# Patient Record
Sex: Male | Born: 1975 | Hispanic: Yes | Marital: Married | Smoking: Former smoker
Health system: Southern US, Community
[De-identification: ages and names within clinical notes are randomized; demographics above are authoritative.]

## PROBLEM LIST (undated history)

## (undated) DIAGNOSIS — J1282 Pneumonia due to coronavirus disease 2019: Secondary | ICD-10-CM

## (undated) DIAGNOSIS — J9621 Acute and chronic respiratory failure with hypoxia: Secondary | ICD-10-CM

## (undated) DIAGNOSIS — Z6830 Body mass index (BMI) 30.0-30.9, adult: Secondary | ICD-10-CM

## (undated) DIAGNOSIS — U071 COVID-19: Secondary | ICD-10-CM

## (undated) DIAGNOSIS — J939 Pneumothorax, unspecified: Secondary | ICD-10-CM

## (undated) DIAGNOSIS — K219 Gastro-esophageal reflux disease without esophagitis: Secondary | ICD-10-CM

## (undated) HISTORY — PX: APPENDECTOMY: SHX54

## (undated) HISTORY — DX: Body mass index (BMI) 30.0-30.9, adult: Z68.30

## (undated) HISTORY — DX: Gastro-esophageal reflux disease without esophagitis: K21.9

## (undated) NOTE — *Deleted (*Deleted)
Physical Medicine and Rehabilitation Admission H&P    CC: Functional deficits due to debility.   HPI: Dennis Howe is a 26 year old male (unvaccinated) who was originally admitted to [facility] on 03/25/20 with reports of chest pain, hypoxia and DOE after testing positive for Covid 19 on 03/21/20.  He was treated with steroids, multiple antibiotics for PNA as well as  Actemra and Remdesivir but had worsening of respiratory status with development of pneumothoraces, pneumomediastinum and subcutaneous emphysema  requiring intubation on 10/09 due to ARDS. Multiple  chest tube placement 10/10, he underwent tracheostomy 10/26 and was slow to wean off vent. He was found to have superficial venous thrombosis around PICC which was removed as well as  SSTI last treated with cefdinir 11/03.  He had recurrent pneumothoraces requiring need for 2 right sided and one left sided chest tubes which were clamped with recommendations to remove when appropriate. Due to ongoing vent needs, he was discharged to [facility] on 04/29/20.  PEG placed on 11/10 by Dr. Fredia Sorrow. He tolerated extubation to ATC and was started on capping trails therefore was   Therapy has been ongoing and patient noted to be significantly deconditioned.      ROS    Past Medical History:  Diagnosis Date  . Acute on chronic respiratory failure with hypoxia (HCC)   . Bilateral pneumothoraces   . BMI 30.0-30.9,adult   . COVID-19 virus infection   . Pneumonia due to COVID-19 virus     Past Surgical History:  Procedure Laterality Date  . IR GASTROSTOMY TUBE MOD SED  05/03/2020    Family History  Problem Relation Age of Onset  . Diabetes Mother      Social History: Lives with wife. Independent and was working in [employer]. He used to smoke 1/2 PPD--quit in 2000. Alcohol use. Drug use.     Allergies: No Known Allergies    Medications Prior to Admission  Medication Sig Dispense Refill  . cefdinir (OMNICEF) 250 MG/5ML suspension  Place 6 mLs (300 mg total) into feeding tube 2 (two) times daily. 60 mL 0  . clonazePAM (KLONOPIN) 1 MG disintegrating tablet Place 1 tablet (1 mg total) into feeding tube 2 (two) times daily. 30 tablet 0  . enoxaparin (LOVENOX) 40 MG/0.4ML injection Inject 0.4 mLs (40 mg total) into the skin daily. 0 mL   . fiber (NUTRISOURCE FIBER) PACK packet Place 1 packet into feeding tube 2 (two) times daily.    . insulin aspart (NOVOLOG) 100 UNIT/ML injection Inject 0-15 Units into the skin every 4 (four) hours. 10 mL 11  . metoprolol tartrate (LOPRESSOR) 25 mg/10 mL SUSP Place 5 mLs (12.5 mg total) into feeding tube 3 (three) times daily.  0  . Nutritional Supplements (FEEDING SUPPLEMENT, PROSOURCE TF,) liquid Place 45 mLs into feeding tube 5 (five) times daily.    . Nutritional Supplements (FEEDING SUPPLEMENT, VITAL 1.5 CAL,) LIQD Place 1,000 mLs into feeding tube continuous.    Marland Kitchen oxyCODONE (ROXICODONE) 5 MG/5ML solution Place 5 mLs (5 mg total) into feeding tube every 6 (six) hours.  0  . pantoprazole sodium (PROTONIX) 40 mg/20 mL PACK Place 20 mLs (40 mg total) into feeding tube daily at 12 noon. 30 mL   . QUEtiapine (SEROQUEL) 200 MG tablet Place 1 tablet (200 mg total) into feeding tube 2 (two) times daily.      Drug Regimen Review { DRUG REGIMEN RUEAVW:09811}  Home: Home Living Living Arrangements: Spouse/significant other, Children (11 and  43 year old children at home; has older children not ) Available Help at Discharge: Family, Available 24 hours/day (wife can take off FMLA) Type of Home: House Home Access: Level entry Home Layout: Two level, Able to live on main level with bedroom/bathroom, 1/2 bath on main level Alternate Level Stairs-Number of Steps: flight Bathroom Shower/Tub: Associate Professor: Yes Home Equipment: None  Lives With: Spouse, Family   Functional History: Prior Function Level of Independence: Independent Comments: works  in [employer]. 51 yo daughter (works in [employer], lives with wife, 16 and 15 year old c)  Functional Status:  Mobility: He requires supervision with bed mobility. Requires min assist for sit to stand with min to CGA for weigh shifting He has been able to take 5-6 pivoting steps   ADL:   Cognition: Cognition Overall Cognitive Status: Within Functional Limits for tasks assessed Orientation Level: Oriented X4 Cognition Overall Cognitive Status: Within Functional Limits for tasks assessed Difficult to assess due to: Tracheostomy   Blood pressure 119/81, pulse 77, resp. rate (!) 28, SpO2 96 %. Physical Exam  No results found for this or any previous visit (from the past 48 hour(s)). No results found.     Medical Problem List and Plan: 1.  *** secondary to ***  -patient may *** shower  -ELOS/Goals: *** 2.  Antithrombotics: -DVT/anticoagulation:  {VTE PROPHYLAXIS/ANTICOAGULATION - WUJW:11914}  -antiplatelet therapy: *** 3. Pain Management:  4. Mood: LCSW to follow for evaluation and support.   -antipsychotic agents:  5. Neuropsych: This patient *** capable of making decisions on *** own behalf. 6. Skin/Wound Care: *** 7. Fluids/Electrolytes/Nutrition: *** 8. Bilateral pneumothoraces: 9. Leucocytosis: Monitor for signs of infection.  10. Pre-diabetes: Hgb A1c-6.2.  11.    ***  Jacquelynn Cree, PA-C 05/16/2020

## (undated) NOTE — *Deleted (*Deleted)
PMR Admission Coordinator Pre-Admission Assessment  Patient: Dennis Howe is an 59 y.o., male [name]: [id number] [name]: 1976-04-28 Height:   Weight:    Insurance Information HMO:     PPO: yes     PCP:      IPA:      80/20:      OTHER:  PRIMARY: [employer]      Policy#: [id number]      Subscriber: pt CM Name: [name]      Phone#: [phone]     Fax#: [phone] Pre-Cert#: [id number]      Employer: group [id number] Benefits:  Phone #: online     Name: 11/22 Eff. Date: 03/03/20 until 05/23/2020     Deduct: $4000/met      Out of Pocket Max: $8150/met CIR: $1000 per admit copay then 100%      SNF: 70% 60 days Outpatient: $80 per visit     Co-Pay: visits per medical neccesity Home Health: 100%      Co-Pay: visits per medical neccesity DME: 80%     Co-Pay: 20% Providers: in network  SECONDARY: [employer]      Policy#: [id number]     Subscriber: [name] group # [id number]  Financial Counselor:       Phone#:   The "Data Collection Information Summary" for patients in Inpatient Rehabilitation Facilities with attached "Privacy Act Statement-Health Care Records" was provided and verbally reviewed with: N/A  Emergency Contact Information Contact Information    Name Relation Home Work Howe, Dennis Spouse   [phone]      Current Medical History  Patient Admitting Diagnosis: Debility Post COVID  History of Present Illness: 15 year old male without significant medical history who presented 03/25/2020 to [facility] who was unvaccinated against COVID 19 who recently tested positive for COVID 19 on 03/21/2020. Presented 03/25/2020 with substernal chest pain and shortness of breath worsened by exertion over past 24 hours.    He received COVID related treatment including steroids, Actemra and Remdesivir. ON 10/8 PCCM consulted and he was intubated on 04/01/2020. He had multiple chest tube placements for management of pneumothoraces 10/10. Transferred to [facility]  hospital ICU on 10/11, ECMO consulted but deemed not necessary, but suitable candidate if condition should worsen and become indicated. and began prone ventilation through 10/15. Patient underwent tracheostomy placement on 10/26 and tolerated brief trach collar trials. 11/4 superficial R cephalic venous thrombosis identified. On 11/6 began chest tubes to be clamped and recommended chest tubes to be removed one at a time as appropriate. Nutrition via [facility]. Discharged to [facility] on 04/29/2020.  At [facility] m PEG placed on 11/10 in IR. He eventually tolerated ATC and began capping trials. De cannulated on 05/16/2020. On O2 at 1 liter [place]. Diet upgrade to Dysphagia 3 diet with thin liquids. Now overall deconditioned. Chest tubes removed.  Patient's medical [facility] and [facility] has been reviewed by the rehabilitation admission coordinator and physician.  Past Medical History  Past Medical History:  Diagnosis Date  . Acute on chronic respiratory failure with hypoxia (HCC)   . Bilateral pneumothoraces   . BMI 30.0-30.9,adult   . COVID-19 virus infection   . Pneumonia due to COVID-19 virus     Family History   family history is not on file.  Prior Rehab/Hospitalizations Has the patient had prior rehab or hospitalizations prior to admission? Yes  Has the patient had major surgery during 100 days  prior to admission? Yes   Current Medications  Current Facility-Administered Medications:  .  ceFAZolin (ANCEF) IVPB 2g/100 mL premix, 2 g, Intravenous, Once, Louk, Alexandra M, PA-C  Patients Current Diet: Diet Dysphagia 3 diet with thin liquids  Precautions / Restrictions Fall precautions  Has the patient had 2 or more falls or a fall with injury in the past year? No  Prior Activity Level Community (5-7x/wk): Independent and working pta; driving   Prior Functional Level Self Care: Did the patient need help bathing, dressing, using  the toilet or eating? Independent  Indoor Mobility: Did the patient need assistance with walking from room to room (with or without device)? Independent  Stairs: Did the patient need assistance with internal or external stairs (with or without device)? Independent  Functional Cognition: Did the patient need help planning regular tasks such as shopping or remembering to take medications? Independent  Home Assistive Devices / Equipment None None   Prior Device Use: Indicate devices/aids used by the patient prior to current illness, exacerbation or injury? None of the above   Prior Functional Level Current Functional Level  Bed Mobility  Independent supervision   Transfers  Independent   min assist   Mobility - Walk/Wheelchair  Independent    Min assist 24 feet with RW  Upper Body Dressing  Independent   min   Lower Body Dressing  Independent   mod; left hand tremors   Grooming  Independent   min   Eating/Drinking  Independent   s to min   Toilet Transfer  Independent   min   Bladder Continence   continent   continent   Bowel Management  continent   continent   Stair Climbing  Independent   not attempted   Communication  independent   independent   Memory  intact  intact     Special Needs/ Care Considerations De cannulated 11/23 O2 at 1 liter [place] PEG placed 20 FR on 11/10  Previous Home Environment  Living Arrangements: Spouse/significant other;Children (66 and 19 year old children at home; has older children not )  Lives With: Spouse;Family Available Help at Discharge: Family;Available 24 hours/day (wife can take off FMLA) Type of Home: House Home Layout: Two level;Able to live on main level with bedroom/bathroom;1/2 bath on main level Alternate Level Stairs-Number of Steps: flight Home Access: Level entry Bathroom Shower/Tub: Engineer, manufacturing systems: Standard Bathroom Accessibility: Yes How Accessible: Accessible via walker  Home Care Services: No  Discharge Living Setting Plans for Discharge Living Setting: Patient's home;Lives with (comment) Type of Home at Discharge: House Discharge Home Layout: Two level;Able to live on main level with bedroom/bathroom;1/2 bath on main level Alternate Level Stairs-Number of Steps: flight Discharge Home Access: Level entry Discharge Bathroom Shower/Tub: Tub/shower unit Discharge Bathroom Toilet: Standard Discharge Bathroom Accessibility: Yes How Accessible: Accessible via walker Does the patient have any problems obtaining your medications?: No  Social/Family/Support Systems Patient Roles: Spouse;Parent (employee) Contact Information: wife, [name] Anticipated Caregiver: wife Anticipated Caregiver's Contact Information: [phone] Ability/Limitations of Caregiver: wife works but plans 6 weeks FMLA Caregiver Availability: 24/7 Discharge Plan Discussed with Primary Caregiver: Yes Is Caregiver In Agreement with Plan?: Yes Does Caregiver/Family have Issues with Lodging/Transportation while Pt is in Rehab?: No  Goals Patient/Family Goal for Rehab: Mod I to supervision with PT, OT, and SLP Expected length of stay: ELOS 10 to 14 days Cultural Considerations: Spanish speaker but he and his wife are fluent in [place] Pt/Family Agrees to Admission and willing to  participate: Yes Program Orientation Provided & Reviewed with Pt/Caregiver Including Roles  & Responsibilities: Yes  Decrease burden of Care through IP rehab admission: n/a  Possible need for SNF placement upon discharge: not anticipated  Patient Condition: I have reviewed medical records from [facility] and [facility] , spoken with CSW, and patient and spouse. I met with patient at the bedside for inpatient rehabilitation assessment.  Patient will benefit from ongoing PT, OT and SLP, can actively participate in 3 hours of therapy a day 5 days of the week, and can make measurable gains  during the admission.  Patient will also benefit from the coordinated team approach during an Inpatient Acute Rehabilitation admission.  The patient will receive intensive therapy as well as Rehabilitation physician, nursing, social worker, and care management interventions.  Due to bladder management, bowel management, safety, skin/wound care, disease management, medication administration, pain management and patient education the patient requires 24 hour a day rehabilitation nursing.  The patient is currently min to mod assist overall with mobility and basic ADLs.  Discharge setting and therapy post discharge at home with home health is anticipated.  Patient has agreed to participate in the Acute Inpatient Rehabilitation Program and will admit today.  Preadmission Screen Completed By:  Clois Dupes, 05/16/2020 1:58 PM ______________________________________________________________________   Discussed status with Dr. Marland Kitchen on *** at *** and received approval for admission today.  Admission Coordinator:  Clois Dupes, RN, time Marland KitchenDorna Bloom ***   Assessment/Plan: Diagnosis: 1. Does the need for close, 24 hr/day Medical supervision in concert with the patient's rehab needs make it unreasonable for this patient to be served in a less intensive setting? {yes_no_potentially:3041433} 2. Co-Morbidities requiring supervision/potential complications: *** 3. Due to {due HY:8657846}, does the patient require 24 hr/day rehab nursing? {yes_no_potentially:3041433} 4. Does the patient require coordinated care of a physician, rehab nurse, PT, OT, and SLP to address physical and functional deficits in the context of the above medical diagnosis(es)? {yes_no_potentially:3041433} Addressing deficits in the following areas: {deficits:3041436} 5. Can the patient actively participate in an intensive therapy program of at least 3 hrs of therapy 5 days a week? {yes_no_potentially:3041433} 6. The potential for  patient to make measurable gains while on inpatient rehab is {potential:3041437} 7. Anticipated functional outcomes upon discharge from inpatient rehab: {functional outcomes:304600100} PT, {functional outcomes:304600100} OT, {functional outcomes:304600100} SLP 8. Estimated rehab length of stay to reach the above functional goals is: *** 9. Anticipated discharge destination: {anticipated dc setting:21604} 10. Overall Rehab/Functional Prognosis: {potential:3041437}  MD Signature ***

---
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
## 2005-05-30 ENCOUNTER — Emergency Department (HOSPITAL_COMMUNITY): Admission: EM | Admit: 2005-05-30 | Discharge: 2005-05-30 | Payer: Self-pay | Admitting: Emergency Medicine

## 2008-10-26 ENCOUNTER — Observation Stay (HOSPITAL_COMMUNITY): Admission: EM | Admit: 2008-10-26 | Discharge: 2008-10-27 | Payer: Self-pay | Admitting: Emergency Medicine

## 2008-10-26 ENCOUNTER — Encounter (INDEPENDENT_AMBULATORY_CARE_PROVIDER_SITE_OTHER): Payer: Self-pay | Admitting: Surgery

## 2010-10-02 LAB — DIFFERENTIAL
Basophils Absolute: 0 10*3/uL (ref 0.0–0.1)
Basophils Relative: 0 % (ref 0–1)
Lymphocytes Relative: 41 % (ref 12–46)
Monocytes Absolute: 0.5 10*3/uL (ref 0.1–1.0)
Monocytes Relative: 8 % (ref 3–12)
Neutro Abs: 2.9 10*3/uL (ref 1.7–7.7)
Neutrophils Relative %: 49 % (ref 43–77)

## 2010-10-02 LAB — COMPREHENSIVE METABOLIC PANEL
Albumin: 4.1 g/dL (ref 3.5–5.2)
Alkaline Phosphatase: 52 U/L (ref 39–117)
BUN: 15 mg/dL (ref 6–23)
Creatinine, Ser: 0.88 mg/dL (ref 0.4–1.5)
Glucose, Bld: 102 mg/dL — ABNORMAL HIGH (ref 70–99)
Potassium: 3.6 mEq/L (ref 3.5–5.1)
Total Bilirubin: 0.4 mg/dL (ref 0.3–1.2)
Total Protein: 7.3 g/dL (ref 6.0–8.3)

## 2010-10-02 LAB — CBC
HCT: 40.8 % (ref 39.0–52.0)
Hemoglobin: 14.1 g/dL (ref 13.0–17.0)
MCV: 90.8 fL (ref 78.0–100.0)
Platelets: 163 10*3/uL (ref 150–400)
RDW: 12.1 % (ref 11.5–15.5)

## 2010-10-02 LAB — URINALYSIS, ROUTINE W REFLEX MICROSCOPIC
Nitrite: NEGATIVE
Protein, ur: NEGATIVE mg/dL
Specific Gravity, Urine: 1.01 (ref 1.005–1.030)
Urobilinogen, UA: 0.2 mg/dL (ref 0.0–1.0)

## 2010-10-02 LAB — URINE CULTURE

## 2010-11-06 NOTE — Discharge Summary (Signed)
NAMELOGEN, HEINTZELMAN                ACCOUNT NO.:  [id number]   MEDICAL RECORD NO.:  [id number]          PATIENT TYPE:  INP   LOCATION:  5524                         FACILITY:  [facility]   PHYSICIAN:  Wilmon Arms. Corliss Skains, M.D. DATE OF BIRTH:  1976/02/16   DATE OF ADMISSION:  10/25/2008  DATE OF DISCHARGE:  10/26/2008                               DISCHARGE SUMMARY   CHIEF COMPLAINT AND REASON FOR ADMISSION:  Mr. Dennis Howe is a 35 year old  male patient who began having abdominal pain on Oct 24, 2008.  He  initially went to [facility], was suspicious for possible  appendicitis, so sent to [facility] ER.  CT scan was consistent with  appendicitis.  The patient's white count was normal at 5900.  CT  demonstrated a distended appendix with an appendicolith.  On exam, the  patient was tender in the right lower quadrant without rebounding and  bowel sounds were present.  The patient was admitted by Dr. Ezzard Standing with  a diagnosis of acute appendicitis.   HOSPITAL COURSE:  The patient was admitted and taken from the ER to the  OR where he underwent an uncomplicated laparoscopic appendectomy for  nonperforated acute appendicitis early.  He was sent back to the general  floor to recover.   On early morning hours of postop day #1 half, the patient was afebrile,  vital signs were stable.  He is tolerating a clear liquid diet with  minimal complaints of trocar site pain.  Incisions were clean, dry, and  intact.  Plans were to advance his diet, begin him on oral pain  medications, add NSAIDs, and discharge after 3 p.m. if the patient  tolerated above.  Otherwise, the patient would remain in the hospital  until Oct 27, 2008 and plans would be to discharge the patient then.  At  the present time, discharge plans to discharge after 3 p.m. on Oct 26, 2008.   FINAL DISCHARGE DIAGNOSIS:  Acute nonperforated appendicitis status post  laparoscopic appendectomy by Dr. Ovidio Kin.   DISCHARGE MEDICATIONS:  The  patient was not taking any medications  before admission.   NEW MEDICATIONS:  1. Vicodin 5/325 one to two tablets every 4 hours as needed for pain.  2. Ibuprofen 600 mg t.i.d. p.r.n. pain in addition to Vicodin always      take with food or snack.   WOUND CARE:  1. Remove bandages tomorrow on Oct 27, 2008.  2. Allow Steri-Strips to fall off.   Return to work in 2 weeks.  Please see note.  I have written that the  patient be out of work for at least 2 weeks.  No lifting greater than 15  pounds for 2 weeks.  If he can return to work but can meet the lifting  restrictions of no lifting greater than 15 pounds, he may return to work  in 1 week.   DIET:  No restrictions.   ACTIVITY:  Increase activity slowly.  May walk up steps.  May shower.  No lifting greater than 15 pounds for 2 weeks and no driving for 1  week.   FOLLOWUP APPOINTMENTS:  The patient is to follow up with the [facility]  at [facility] surgery, telephone number is [phone] on Nov 08, 2008 at 2:45 p.m.  He is to arrive by 2:30 p.m.   ADDITIONAL INSTRUCTIONS:  The patient is to call the surgeon's office if  a fever greater than or equal to 101.5 degrees Fahrenheit, new or  increased belly pain, see redness or drainage from wounds, nausea,  vomiting, diarrhea, or constipation.      Allison L. Rondel Jumbo. Tsuei, M.D.  Electronically Signed    ALE/MEDQ  D:  10/26/2008  T:  10/26/2008  Job:  [id number]   cc:   Sandria Bales. Ezzard Standing, M.D.

## 2010-11-06 NOTE — Op Note (Signed)
NAMEYVAN, DORITY                ACCOUNT NO.:  [id number]   MEDICAL RECORD NO.:  [id number]          PATIENT TYPE:  INP   LOCATION:  5524                         FACILITY:  [facility]   PHYSICIAN:  Sandria Bales. Ezzard Standing, M.D.  DATE OF BIRTH:  May 13, 1976   DATE OF PROCEDURE:  DATE OF DISCHARGE:                               OPERATIVE REPORT   Date of Surgery - 26 Oct 2008   PREOPERATIVE DIAGNOSIS:  Appendicitis.   POSTOPERATIVE DIAGNOSIS:  Appendicitis.   PROCEDURE:  Laparoscopic appendectomy.   SURGEON:  Sandria Bales. Ezzard Standing, MD   ANESTHESIA:  General endotracheal.   ESTIMATED BLOOD LOSS:  Minimal.   INDICATIONS FOR PROCEDURE:  Mr. Dennis Howe is a 35 year old Hispanic male  who has had about a 2-day history of abdominal pain.  A CT scan this  evening at [facility] showed early appendicitis.  The patient now comes to the  operating room for attempted laparoscopic appendectomy.   Indications and risks of appendectomy was explained to the patient.  Potential risks include bleeding, open surgery, need for bowel  resection, and possibility not the cause of his abdominal pain.   OPERATIVE NOTE:  The patient had a Foley catheter in place.  Abdomen was prepped with  Betadine solution and sterilely draped under general anesthesia  supervised by Dr. Diamantina Monks.  The patient was given 2 g of Unasyn at  the beginning of the case.  A time-out was held identifying the patient  and procedure.   I accessed the abdominal cavity with a 12-mm Hasson trocar and entered  the abdominal cavity without difficulty.  I secured the Hasson trocar  with a 0-Vicryl suture.  I placed a 5-mm applied medical trocar in the  right upper quadrant and an 11-mm applied medical in the left lower  quadrant.  I did abdominal exploration, right and left lobes of the  liver unremarkable, gallbladder unremarkable, stomach was unremarkable  and his bowel that I could see was unremarkable.  In the right lower  quadrant, he had an omentum  stuck over an inflamed appendix.  There were  some purulence around the appendix, but this was initially an early  appendicitis without evidence of rupture.   I took the mesentery of the appendix using a harmonic scalpel, divided  the base of the appendix using a vascular load of the [employer] End[employer] GIA  45-mm stapler.  I placed the appendix in an EndoCatch bag and delivered  through the umbilicus.  I then irrigated the base of the appendix,  staple line looked healthy.  There was no bleeding.  Each trocar was  then removed in turn.  The umbilical port was closed with 0-Vicryl  suture.  The skin of each port closed with 5-0 Vicryl suture.  Each was  painted with tincture of benzoin and Steri-Strips.   The sponge and needle count were correct at the end of the case. The  patient tolerated the procedure well, was transported to recovery room  in good condition.      Sandria Bales. Ezzard Standing, M.D.  Electronically Signed     DHN/MEDQ  D:  10/26/2008  T:  10/26/2008  Job:  [id number]   cc:   Derenda Mis

## 2010-11-06 NOTE — Op Note (Signed)
NAME:  Dennis Howe, Dennis Howe NO.:  [id number]   MEDICAL RECORD NO.:  [id number]          PATIENT TYPE:  EMS   LOCATION:  MAJO                         FACILITY:  [facility]   PHYSICIAN:  Sandria Bales. Ezzard Standing, M.D.  DATE OF BIRTH:  Feb 07, 1976   DATE OF PROCEDURE:  DATE OF DISCHARGE:                               OPERATIVE REPORT   Date of Admission - 26 Oct 2008   HISTORY OF ILLNESS:  This is a 35 year old Hispanic male who uses  [facility] as his primary care physician.  He had no particular  physician's name.  He was in otherwise good health, having abdominal  pain in his lower abdomen, kind of, to the right lower quadrant on  Monday, Oct 24, 2008.  Pain got worse today, so he went to the [facility]  first, they then sent him to the [facility] Room.  He was then  evaluated there and had a CT scan, which appears to show a dilated  appendix with appendolith consistent with early appendicitis.  The  patient denies any history of peptic ulcer disease, liver disease,  pancreatic disease, or colon disease.  He has had no prior abdominal  surgery.   PAST MEDICAL HISTORY:  He has no allergies.   He is on no medications.   REVIEW OF SYSTEMS:  NEUROLOGIC:  No seizure or loss of consciousness.  PULMONARY:  No history of pneumonia or tuberculosis.  CARDIAC:  He has no history of heart disease, chest pain, or cardiac  evaluation.  GASTROINTESTINAL:  See history of present illness.  UROLOGIC:  No kidney stones or kidney infections.  ENDOCRINE:  No history of diabetes.  MUSCULOSKELETAL:  No joint or back trouble.   He is married, has a 44-month-old at home.  He works cleaning.   PHYSICAL EXAMINATION:  VITAL SIGNS:  His temperature is 97.6, blood  pressure 101/63, pulse is 56.  HEENT:  Unremarkable.  NECK:  Supple.  I feel no masses or thyromegaly.  LUNGS:  Clear to auscultation with symmetric breath sounds.  HEART:  Regular rate and rhythm without murmur or rub.  ABDOMEN:  He  actually has just some minimal soreness in his right lower  quadrant.  He really has no guarding or rebound.  He does have some  decreased, but active bowel sounds.  EXTREMITIES:  He has good strength in all 4 extremities.  NEUROLOGIC:  Grossly intact.   Labs that I have, have a white blood count of 5900, a hemoglobin of 14,  hematocrit 40, platelet count of 163,000.  His sodium is 136, potassium 3.6, chloride of 104, CO2 of 24, glucose of  102, creatinine of 0.8, lipase is 23.   IMPRESSION:  Acute appendicitis.  I have discussed with the patient  about proceeding with appendectomy this morning.  Discussed the  indications, potential complications of appendix surgery.  Potential  complications include,  but are not limited to, bleeding, the need for open surgery, the  possibility of needing bowel resection, or there could be some other  cause for his pain other than appendicitis.   We  will go ahead with laparoscopic appendectomy when the OR is ready.      Sandria Bales. Ezzard Standing, M.D.  Electronically Signed     DHN/MEDQ  D:  10/26/2008  T:  10/26/2008  Job:  [id number]   cc:   Derenda Mis

## 2020-03-20 ENCOUNTER — Other Ambulatory Visit: Payer: Self-pay

## 2020-03-23 ENCOUNTER — Emergency Department (HOSPITAL_COMMUNITY)
Admission: EM | Admit: 2020-03-23 | Discharge: 2020-03-23 | Disposition: A | Discharge status: Home / Self Care | Payer: 59 | Source: Home / Self Care | Attending: Emergency Medicine | Admitting: Emergency Medicine

## 2020-03-23 ENCOUNTER — Emergency Department (HOSPITAL_COMMUNITY): Payer: 59

## 2020-03-23 DIAGNOSIS — U071 COVID-19: Secondary | ICD-10-CM | POA: Diagnosis not present

## 2020-03-23 LAB — COMPREHENSIVE METABOLIC PANEL
ALT: 83 U/L — ABNORMAL HIGH (ref 0–44)
AST: 61 U/L — ABNORMAL HIGH (ref 15–41)
Albumin: 3.5 g/dL (ref 3.5–5.0)
Alkaline Phosphatase: 90 U/L (ref 38–126)
Anion gap: 13 (ref 5–15)
BUN: 21 mg/dL — ABNORMAL HIGH (ref 6–20)
CO2: 23 mmol/L (ref 22–32)
Calcium: 8.6 mg/dL — ABNORMAL LOW (ref 8.9–10.3)
Chloride: 96 mmol/L — ABNORMAL LOW (ref 98–111)
Creatinine, Ser: 0.94 mg/dL (ref 0.61–1.24)
GFR calc Af Amer: >60 mL/min (ref >60)
GFR calc non Af Amer: >60 mL/min (ref >60)
Glucose, Bld: 125 mg/dL — ABNORMAL HIGH (ref 70–99)
Potassium: 4.1 mmol/L (ref 3.5–5.1)
Sodium: 132 mmol/L — ABNORMAL LOW (ref 135–145)
Total Bilirubin: 0.6 mg/dL (ref 0.3–1.2)
Total Protein: 7.4 g/dL (ref 6.5–8.1)

## 2020-03-23 LAB — CBC
HCT: 44.7 % (ref 39.0–52.0)
Hemoglobin: 14.9 g/dL (ref 13.0–17.0)
MCH: 29.4 pg (ref 26.0–34.0)
MCHC: 33.3 g/dL (ref 30.0–36.0)
MCV: 88.2 fL (ref 80.0–100.0)
Platelets: 152 10*3/uL (ref 150–400)
RBC: 5.07 MIL/uL (ref 4.22–5.81)
RDW: 12 % (ref 11.5–15.5)
WBC: 4.3 10*3/uL (ref 4.0–10.5)
nRBC: 0 % (ref 0.0–0.2)

## 2020-03-23 MED ORDER — ACETAMINOPHEN 325 MG PO TABS
650.0000 mg | ORAL_TABLET | Freq: Once | ORAL | Status: AC
  Administered 2020-03-23: 650 mg via ORAL
  Filled 2020-03-23: qty 2

## 2020-03-23 MED ORDER — IBUPROFEN 400 MG PO TABS
400.0000 mg | ORAL_TABLET | Freq: Once | ORAL | Status: AC
  Administered 2020-03-23: 400 mg via ORAL
  Filled 2020-03-23: qty 1

## 2020-03-23 MED ORDER — BENZONATATE 100 MG PO CAPS: 100.0000 mg | ORAL_CAPSULE | Freq: Three times a day (TID) | ORAL | 0 refills | Status: DC

## 2020-03-23 MED ORDER — PROMETHAZINE-DM 6.25-15 MG/5ML PO SYRP: 5.0000 mL | ORAL_SOLUTION | Freq: Four times a day (QID) | ORAL | 0 refills | Status: DC | PRN

## 2020-03-23 MED ORDER — HYDROCOD POLST-CPM POLST ER 10-8 MG/5ML PO SUER
5.0000 mL | Freq: Once | ORAL | Status: AC
  Administered 2020-03-23: 5 mL via ORAL
  Filled 2020-03-23: qty 5

## 2020-03-23 MED ORDER — ONDANSETRON 4 MG PO TBDP: 4.0000 mg | ORAL_TABLET | Freq: Three times a day (TID) | ORAL | 0 refills | Status: DC | PRN

## 2020-03-23 NOTE — Discharge Instructions (Signed)
As you are on your 6-day of Covid I believe you will benefit from monoclonal antibody infusion.  I have provided the infusion center with your information.  I have also provided you with the phone number to the [facility] clinic which she can follow-up with.  Please use Tylenol or ibuprofen for pain.  You may use 600 mg ibuprofen every 6 hours or 1000 mg of Tylenol every 6 hours.  You may choose to alternate between the 2.  This would be most effective.  Not to exceed 4 g of Tylenol within 24 hours.  Not to exceed 3200 mg ibuprofen 24 hours.   In the meantime follow CDC guidelines and quarantine, wear a mask, wash hands often.   Please take over the counter vitamin D 2000-4000 units per day. I also recommend zinc 50 mg per day for the next two weeks.   Please return to ED if you feel have difficulty breathing or have emergent, new or concerning symptoms.  Patients who have symptoms consistent with COVID-19 should self isolated for: At least 3 days (72 hours) have passed since recovery, defined as resolution of fever without the use of fever reducing medications and improvement in respiratory symptoms (e.g., cough, shortness of breath), and At least 7 days have passed since symptoms first appeared.       Person Under Monitoring Name: Dennis Howe  Location: [address] [place] [address]   Infection Prevention Recommendations for Individuals Confirmed to have, or Being Evaluated for, 2019 Novel Coronavirus (COVID-19) Infection Who Receive Care at Home  Individuals who are confirmed to have, or are being evaluated for, COVID-19 should follow the prevention steps below until a healthcare provider or local or state health department says they can return to normal activities.  Stay home except to get medical care You should restrict activities outside your home, except for getting medical care. Do not go to work, school, or public areas, and do not use public  transportation or taxis.  Call ahead before visiting your doctor Before your medical appointment, call the healthcare provider and tell them that you have, or are being evaluated for, COVID-19 infection. This will help the healthcare provider's office take steps to keep other people from getting infected. Ask your healthcare provider to call the local or state health department.  Monitor your symptoms Seek prompt medical attention if your illness is worsening (e.g., difficulty breathing). Before going to your medical appointment, call the healthcare provider and tell them that you have, or are being evaluated for, COVID-19 infection. Ask your healthcare provider to call the local or state health department.  Wear a facemask You should wear a facemask that covers your nose and mouth when you are in the same room with other people and when you visit a healthcare provider. People who live with or visit you should also wear a facemask while they are in the same room with you.  Separate yourself from other people in your home As much as possible, you should stay in a different room from other people in your home. Also, you should use a separate bathroom, if available.  Avoid sharing household items You should not share dishes, drinking glasses, cups, eating utensils, towels, bedding, or other items with other people in your home. After using these items, you should wash them thoroughly with soap and water.  Cover your coughs and sneezes Cover your mouth and nose with a tissue when you cough or sneeze, or you can cough  or sneeze into your sleeve. Throw used tissues in a lined trash can, and immediately wash your hands with soap and water for at least 20 seconds or use an alcohol-based hand rub.  Wash your [employer] your hands often and thoroughly with soap and water for at least 20 seconds. You can use an alcohol-based hand sanitizer if soap and water are not available and if your hands  are not visibly dirty. Avoid touching your eyes, nose, and mouth with unwashed hands.   Prevention Steps for Caregivers and Household Members of Individuals Confirmed to have, or Being Evaluated for, COVID-19 Infection Being Cared for in the Home  If you live with, or provide care at home for, a person confirmed to have, or being evaluated for, COVID-19 infection please follow these guidelines to prevent infection:  Follow healthcare provider's instructions Make sure that you understand and can help the patient follow any healthcare provider instructions for all care.  Provide for the patient's basic needs You should help the patient with basic needs in the home and provide support for getting groceries, prescriptions, and other personal needs.  Monitor the patient's symptoms If they are getting sicker, call his or her medical provider and tell them that the patient has, or is being evaluated for, COVID-19 infection. This will help the healthcare provider's office take steps to keep other people from getting infected. Ask the healthcare provider to call the local or state health department.  Limit the number of people who have contact with the patient If possible, have only one caregiver for the patient. Other household members should stay in another home or place of residence. If this is not possible, they should stay in another room, or be separated from the patient as much as possible. Use a separate bathroom, if available. Restrict visitors who do not have an essential need to be in the home.  Keep older adults, very young children, and other sick people away from the patient Keep older adults, very young children, and those who have compromised immune systems or chronic health conditions away from the patient. This includes people with chronic heart, lung, or kidney conditions, diabetes, and cancer.  Ensure good ventilation Make sure that shared spaces in the home have good air  flow, such as from an air conditioner or an opened window, weather permitting.  Wash your hands often Wash your hands often and thoroughly with soap and water for at least 20 seconds. You can use an alcohol based hand sanitizer if soap and water are not available and if your hands are not visibly dirty. Avoid touching your eyes, nose, and mouth with unwashed hands. Use disposable paper towels to dry your hands. If not available, use dedicated cloth towels and replace them when they become wet.  Wear a facemask and gloves Wear a disposable facemask at all times in the room and gloves when you touch or have contact with the patient's blood, body fluids, and/or secretions or excretions, such as sweat, saliva, sputum, nasal mucus, vomit, urine, or feces.  Ensure the mask fits over your nose and mouth tightly, and do not touch it during use. Throw out disposable facemasks and gloves after using them. Do not reuse. Wash your hands immediately after removing your facemask and gloves. If your personal clothing becomes contaminated, carefully remove clothing and launder. Wash your hands after handling contaminated clothing. Place all used disposable facemasks, gloves, and other waste in a lined container before disposing them with other household waste.  Remove gloves and wash your hands immediately after handling these items.  Do not share dishes, glasses, or other household items with the patient Avoid sharing household items. You should not share dishes, drinking glasses, cups, eating utensils, towels, bedding, or other items with a patient who is confirmed to have, or being evaluated for, COVID-19 infection. After the person uses these items, you should wash them thoroughly with soap and water.  Wash laundry thoroughly Immediately remove and wash clothes or bedding that have blood, body fluids, and/or secretions or excretions, such as sweat, saliva, sputum, nasal mucus, vomit, urine, or feces, on  them. Wear gloves when handling laundry from the patient. Read and follow directions on labels of laundry or clothing items and detergent. In general, wash and dry with the warmest temperatures recommended on the label.  Clean all areas the individual has used often Clean all touchable surfaces, such as counters, tabletops, doorknobs, bathroom fixtures, toilets, phones, keyboards, tablets, and bedside tables, every day. Also, clean any surfaces that may have blood, body fluids, and/or secretions or excretions on them. Wear gloves when cleaning surfaces the patient has come in contact with. Use a diluted bleach solution (e.g., dilute bleach with 1 part bleach and 10 parts water) or a household disinfectant with a label that says EPA-registered for coronaviruses. To make a bleach solution at home, add 1 tablespoon of bleach to 1 quart (4 cups) of water. For a larger supply, add  cup of bleach to 1 gallon (16 cups) of water. Read labels of cleaning products and follow recommendations provided on product labels. Labels contain instructions for safe and effective use of the cleaning product including precautions you should take when applying the product, such as wearing gloves or eye protection and making sure you have good ventilation during use of the product. Remove gloves and wash hands immediately after cleaning.  Monitor yourself for signs and symptoms of illness Caregivers and household members are considered close contacts, should monitor their health, and will be asked to limit movement outside of the home to the extent possible. Follow the monitoring steps for close contacts listed on the symptom monitoring form.   ? If you have additional questions, contact your local health department or call the epidemiologist on call at [phone] (available 24/7). ? This guidance is subject to change. For the most up-to-date guidance from [facility], please refer to their  website: [email or web address]

## 2020-03-23 NOTE — ED Notes (Signed)
  Patient SPO2 92-94% while resting in the room.  Patient ambulated around in the room with pulse oximetry and maintained SPO2 between 92-94%.  Patient did cough some but did not drop his SPO2 levels.  No dizziness or difficulty ambulating.  Wylder PA notified.

## 2020-03-23 NOTE — ED Provider Notes (Signed)
[facility] EMERGENCY DEPARTMENT Provider Note   CSN: [id number] Arrival date & time: 03/23/20  1224     History No chief complaint on file.   Dennis Howe is a 44 y.o. male.  HPI Patient is a 44 year old male with no past medical history apart from mild elevation in BMI presented today on day 6 of Covid his primary complaint is nausea and vomiting.  His symptoms started Saturday 6 days ago and he tested positive for days ago.  He states he has not had any vomiting today but has had several episodes over the past few days.  He states yesterday he vomited approximately 7 times.  He is also has some episodes of loose stool but states that these have only been approximately 2-3 times per day and has been only somewhat loose with no watery stool.  No bloody stool.  He states he has not had any significant fevers he has been taking Tylenol for myalgias but states he took no Tylenol today.  When asked, he does endorse some shortness of breath but states that this is mild.  No chest pain no lightheadedness or dizziness.  He is unvaccinated.  No other significant symptoms.  No other associated symptoms.  No aggravating or mitigating factors.    Past Medical History:  Diagnosis Date  . BMI 30.0-30.9,adult     Patient Active Problem List   Diagnosis Date Noted  . BMI 30.0-30.9,adult     No family history on file.  Social History   Tobacco Use  . Smoking status: Not on file  Substance Use Topics  . Alcohol use: Not on file  . Drug use: Not on file    Home Medications Prior to Admission medications   Medication Sig Start Date End Date Taking? Authorizing Provider  benzonatate (TESSALON) 100 MG capsule Take 1 capsule (100 mg total) by mouth every 8 (eight) hours. 03/23/20   Gailen Shelter, PA  ondansetron (ZOFRAN ODT) 4 MG disintegrating tablet Take 1 tablet (4 mg total) by mouth every 8 (eight) hours as needed for nausea or vomiting. 03/23/20   Gailen Shelter, PA  promethazine-dextromethorphan (PROMETHAZINE-DM) 6.25-15 MG/5ML syrup Take 5 mLs by mouth 4 (four) times daily as needed for cough. 03/23/20   Gailen Shelter, PA    Allergies    Patient has no allergy information on record.  Review of Systems   Review of Systems  Constitutional: Positive for appetite change, chills and fatigue. Negative for fever.  HENT: Positive for congestion.   Eyes: Negative for pain.  Respiratory: Positive for cough and shortness of breath.   Cardiovascular: Negative for chest pain and leg swelling.  Gastrointestinal: Positive for diarrhea, nausea and vomiting. Negative for abdominal pain.  Genitourinary: Negative for dysuria.  Musculoskeletal: Positive for myalgias.  Skin: Negative for rash.  Neurological: Negative for dizziness and headaches.    Physical Exam Updated Vital Signs BP (!) 140/91   Pulse 83   Temp 99.1 F (37.3 C) (Oral)   Resp (!) 33   SpO2 92%   Physical Exam Vitals and nursing note reviewed.  Constitutional:      General: He is not in acute distress.    Comments: Fatigued but well-appearing 44 year old male in no acute distress sitting comfortably in bed.  HENT:     Head: Normocephalic and atraumatic.     Nose: Nose normal.  Eyes:     General: No scleral icterus. Cardiovascular:     Rate  and Rhythm: Normal rate and regular rhythm.     Pulses: Normal pulses.     Heart sounds: Normal heart sounds.     Comments: Bilateral pulses 3+ and symmetric. Pulmonary:     Effort: Pulmonary effort is normal. No respiratory distress.     Breath sounds: Normal breath sounds. No wheezing.     Comments: Respiratory rate is 20.  No increased work of breathing.  Not tachypneic.  SPO2 between 92 and 100% on room air.  Ambulated without desaturation. Abdominal:     Palpations: Abdomen is soft.     Tenderness: There is no abdominal tenderness. There is no guarding or rebound.  Musculoskeletal:     Cervical back: Normal range of  motion.     Right lower leg: No edema.     Left lower leg: No edema.  Skin:    General: Skin is warm and dry.     Capillary Refill: Capillary refill takes less than 2 seconds.  Neurological:     Mental Status: He is alert. Mental status is at baseline.  Psychiatric:        Mood and Affect: Mood normal.        Behavior: Behavior normal.     ED Results / Procedures / Treatments   Labs (all labs ordered are listed, but only abnormal results are displayed) Labs Reviewed  COMPREHENSIVE METABOLIC PANEL - Abnormal; Notable for the following components:      Result Value   Sodium 132 (*)    Chloride 96 (*)    Glucose, Bld 125 (*)    BUN 21 (*)    Calcium 8.6 (*)    AST 61 (*)    ALT 83 (*)    All other components within normal limits  CBC    EKG EKG Interpretation  Date/Time:  Thursday March 23 2020 13:18:28 EDT Ventricular Rate:  80 PR Interval:  134 QRS Duration: 98 QT Interval:  360 QTC Calculation: 415 R Axis:   106 Text Interpretation: Normal sinus rhythm Rightward axis Cannot rule out Inferior infarct , age undetermined Abnormal ECG No old tracing to compare Confirmed by Lorre Nick ([id number]) on 03/23/2020 7:57:14 PM   Radiology DG Chest Portable 1 View  Result Date: 03/23/2020 CLINICAL DATA:  COVID a 6.  Worsening shortness of breath and cough. EXAM: PORTABLE CHEST 1 VIEW COMPARISON:  None. FINDINGS: Patchy peripheral predominant opacities in the left greater than right lungs. Low lung volumes. No visible pleural effusions or pneumothorax. Cardiac silhouette is accentuated by low lung volumes but likely within normal limits. No acute osseous abnormality. IMPRESSION: Patchy peripheral predominant opacities in the left greater than right lungs, compatible with multifocal pneumonia and reported COVID diagnosis. Electronically Signed   By: Feliberto Harts MD   On: 03/23/2020 14:00    Procedures Procedures (including critical care time)  Medications Ordered in  ED Medications  acetaminophen (TYLENOL) tablet 650 mg (650 mg Oral Given 03/23/20 2214)  ibuprofen (ADVIL) tablet 400 mg (400 mg Oral Given 03/23/20 2214)  chlorpheniramine-HYDROcodone (TUSSIONEX) 10-8 MG/5ML suspension 5 mL (5 mLs Oral Given 03/23/20 2215)    ED Course  I have reviewed the triage vital signs and the nursing notes.  Pertinent labs & imaging results that were available during my care of the patient were reviewed by me and considered in my medical decision making (see chart for details).    MDM Rules/Calculators/A&P  CMP with very mild transaminitis consistent with COVID-19, calcium and chloride and sodium very mildly low likely secondary to GI losses.  BUN mildly increased but creatinine within normal limits suspect some very mild dehydration.  No tachycardia he is afebrile and SPO2 remained within normal limits during ambulation myself and by technician.  CBC without leukocytosis or anemia. Chest x-ray reviewed by myself consistent with COVID-19. IMPRESSION:  Patchy peripheral predominant opacities in the left greater than  right lungs, compatible with multifocal pneumonia and reported COVID  diagnosis.   We will treat conservatively at this time with benzonatate, Zofran and Promethazine DM cough syrup.  Patient feels significantly improved after 1 dose of Tussionex and Tylenol and ibuprofen.  Patient given strict return precautions.  I did reach out to the monoclonal antibody infusion center on his behalf.  They will added to their list.  Dennis Howe was evaluated in Emergency Department on 03/24/2020 for the symptoms described in the history of present illness. He was evaluated in the context of the global COVID-19 pandemic, which necessitated consideration that the patient might be at risk for infection with the SARS-CoV-2 virus that causes COVID-19. Institutional protocols and algorithms that pertain to the evaluation of patients at risk  for COVID-19 are in a state of rapid change based on information released by regulatory bodies including the CDC and federal and state organizations. These policies and algorithms were followed during the patient's care in the ED.  Final Clinical Impression(s) / ED Diagnoses Final diagnoses:  COVID-19    Rx / DC Orders ED Discharge Orders         Ordered    promethazine-dextromethorphan (PROMETHAZINE-DM) 6.25-15 MG/5ML syrup  4 times daily PRN        03/23/20 2248    benzonatate (TESSALON) 100 MG capsule  Every 8 hours        03/23/20 2248    ondansetron (ZOFRAN ODT) 4 MG disintegrating tablet  Every 8 hours PRN        03/23/20 2248           Gailen Shelter, [place] 03/24/20 2332    Lorre Nick, MD 03/25/20 1726

## 2020-03-23 NOTE — ED Triage Notes (Signed)
Pt here from home on day 6 of covid  With c/o throwing up last night and more sob , nad

## 2020-03-24 ENCOUNTER — Encounter: Payer: Self-pay | Admitting: Physician Assistant

## 2020-03-24 ENCOUNTER — Other Ambulatory Visit: Payer: Self-pay | Admitting: Physician Assistant

## 2020-03-24 ENCOUNTER — Telehealth: Payer: Self-pay | Admitting: Physician Assistant

## 2020-03-24 DIAGNOSIS — U071 COVID-19: Secondary | ICD-10-CM

## 2020-03-24 DIAGNOSIS — Z6830 Body mass index (BMI) 30.0-30.9, adult: Secondary | ICD-10-CM

## 2020-03-24 NOTE — Progress Notes (Signed)
I connected by phone with Alphonse Guild on 03/24/2020 at 2:45 PM to discuss the potential use of a new treatment for mild to moderate COVID-19 viral infection in non-hospitalized patients.  This patient is a 44 y.o. male that meets the FDA criteria for Emergency Use Authorization of COVID monoclonal antibody casirivimab/imdevimab.  Has a (+) direct SARS-CoV-2 viral test result  Has mild or moderate COVID-19   Is NOT hospitalized due to COVID-19  Is within 10 days of symptom onset  Has at least one of the high risk factor(s) for progression to severe COVID-19 and/or hospitalization as defined in EUA.  Specific high risk criteria : BMI > 25 and Other high risk medical condition per CDC:  socially vulnerable population    I have spoken and communicated the following to the patient or parent/caregiver regarding COVID monoclonal antibody treatment:  1. FDA has authorized the emergency use for the treatment of mild to moderate COVID-19 in adults and pediatric patients with positive results of direct SARS-CoV-2 viral testing who are 6 years of age and older weighing at least 40 kg, and who are at high risk for progressing to severe COVID-19 and/or hospitalization.  2. The significant known and potential risks and benefits of COVID monoclonal antibody, and the extent to which such potential risks and benefits are unknown.  3. Information on available alternative treatments and the risks and benefits of those alternatives, including clinical trials.  4. Patients treated with COVID monoclonal antibody should continue to self-isolate and use infection control measures (e.g., wear mask, isolate, social distance, avoid sharing personal items, clean and disinfect "high touch" surfaces, and frequent handwashing) according to CDC guidelines.   5. The patient or parent/caregiver has the option to accept or refuse COVID monoclonal antibody treatment.  After reviewing this information with the  patient, the patient has agreed to receive one of the available covid 19 monoclonal antibodies and will be provided an appropriate fact sheet prior to infusion.  Sx onset 9/26. Set up for infusion on 10/2 @ 10:30am. Directions given to [facility]. Pt is aware that insurance will be charged an infusion fee. Pt is unvaccinated.   Cline Crock 03/24/2020 2:45 PM

## 2020-03-24 NOTE — Telephone Encounter (Signed)
Called to discuss with patient about Covid symptoms and the use of casirivimab/imdevimab, a monoclonal antibody infusion for those with mild to moderate Covid symptoms and at a high risk of hospitalization.  Pt is qualified for this infusion at the [facility] center due to; Specific high risk criteria : BMI > 25   Message left to call back our hotline [phone] and sent a text message.   Cline Crock PA-C  [facility]

## 2020-03-25 ENCOUNTER — Ambulatory Visit (HOSPITAL_COMMUNITY)
Admission: RE | Admit: 2020-03-25 | Discharge: 2020-03-25 | Disposition: A | Discharge status: Home / Self Care | Payer: 59 | Source: Ambulatory Visit | Attending: Pulmonary Disease | Admitting: Pulmonary Disease

## 2020-03-25 ENCOUNTER — Inpatient Hospital Stay (HOSPITAL_COMMUNITY)
Admission: EM | Admit: 2020-03-25 | Discharge: 2020-04-29 | DRG: 004 | Disposition: A | Discharge status: Other Acute Inpatient Hospital | Payer: 59 | Source: Ambulatory Visit | Attending: Pulmonary Disease | Admitting: Pulmonary Disease

## 2020-03-25 ENCOUNTER — Encounter (HOSPITAL_COMMUNITY): Payer: Self-pay | Admitting: Emergency Medicine

## 2020-03-25 ENCOUNTER — Emergency Department (HOSPITAL_COMMUNITY): Payer: 59

## 2020-03-25 DIAGNOSIS — G7281 Critical illness myopathy: Secondary | ICD-10-CM | POA: Diagnosis not present

## 2020-03-25 DIAGNOSIS — Z9689 Presence of other specified functional implants: Secondary | ICD-10-CM

## 2020-03-25 DIAGNOSIS — J1282 Pneumonia due to coronavirus disease 2019: Secondary | ICD-10-CM | POA: Diagnosis present

## 2020-03-25 DIAGNOSIS — I82611 Acute embolism and thrombosis of superficial veins of right upper extremity: Secondary | ICD-10-CM | POA: Diagnosis not present

## 2020-03-25 DIAGNOSIS — N179 Acute kidney failure, unspecified: Secondary | ICD-10-CM | POA: Diagnosis not present

## 2020-03-25 DIAGNOSIS — Z781 Physical restraint status: Secondary | ICD-10-CM | POA: Diagnosis not present

## 2020-03-25 DIAGNOSIS — E872 Acidosis: Secondary | ICD-10-CM | POA: Diagnosis present

## 2020-03-25 DIAGNOSIS — U071 COVID-19: Principal | ICD-10-CM

## 2020-03-25 DIAGNOSIS — J939 Pneumothorax, unspecified: Secondary | ICD-10-CM | POA: Diagnosis not present

## 2020-03-25 DIAGNOSIS — J80 Acute respiratory distress syndrome: Secondary | ICD-10-CM

## 2020-03-25 DIAGNOSIS — J9621 Acute and chronic respiratory failure with hypoxia: Secondary | ICD-10-CM | POA: Diagnosis not present

## 2020-03-25 DIAGNOSIS — E876 Hypokalemia: Secondary | ICD-10-CM | POA: Diagnosis not present

## 2020-03-25 DIAGNOSIS — R739 Hyperglycemia, unspecified: Secondary | ICD-10-CM | POA: Diagnosis not present

## 2020-03-25 DIAGNOSIS — G9341 Metabolic encephalopathy: Secondary | ICD-10-CM | POA: Diagnosis not present

## 2020-03-25 DIAGNOSIS — E875 Hyperkalemia: Secondary | ICD-10-CM | POA: Diagnosis not present

## 2020-03-25 DIAGNOSIS — J14 Pneumonia due to Hemophilus influenzae: Secondary | ICD-10-CM | POA: Diagnosis not present

## 2020-03-25 DIAGNOSIS — J9312 Secondary spontaneous pneumothorax: Secondary | ICD-10-CM | POA: Diagnosis not present

## 2020-03-25 DIAGNOSIS — Z978 Presence of other specified devices: Secondary | ICD-10-CM

## 2020-03-25 DIAGNOSIS — R7989 Other specified abnormal findings of blood chemistry: Secondary | ICD-10-CM | POA: Diagnosis not present

## 2020-03-25 DIAGNOSIS — J9690 Respiratory failure, unspecified, unspecified whether with hypoxia or hypercapnia: Secondary | ICD-10-CM

## 2020-03-25 DIAGNOSIS — Z79899 Other long term (current) drug therapy: Secondary | ICD-10-CM | POA: Diagnosis not present

## 2020-03-25 DIAGNOSIS — T380X5A Adverse effect of glucocorticoids and synthetic analogues, initial encounter: Secondary | ICD-10-CM | POA: Diagnosis not present

## 2020-03-25 DIAGNOSIS — R079 Chest pain, unspecified: Secondary | ICD-10-CM | POA: Diagnosis not present

## 2020-03-25 DIAGNOSIS — R0602 Shortness of breath: Secondary | ICD-10-CM

## 2020-03-25 DIAGNOSIS — R451 Restlessness and agitation: Secondary | ICD-10-CM | POA: Diagnosis not present

## 2020-03-25 DIAGNOSIS — J9601 Acute respiratory failure with hypoxia: Secondary | ICD-10-CM | POA: Diagnosis not present

## 2020-03-25 DIAGNOSIS — R069 Unspecified abnormalities of breathing: Secondary | ICD-10-CM

## 2020-03-25 DIAGNOSIS — Z4659 Encounter for fitting and adjustment of other gastrointestinal appliance and device: Secondary | ICD-10-CM

## 2020-03-25 DIAGNOSIS — R339 Retention of urine, unspecified: Secondary | ICD-10-CM | POA: Diagnosis not present

## 2020-03-25 DIAGNOSIS — R0902 Hypoxemia: Secondary | ICD-10-CM

## 2020-03-25 DIAGNOSIS — J982 Interstitial emphysema: Secondary | ICD-10-CM | POA: Diagnosis present

## 2020-03-25 DIAGNOSIS — J189 Pneumonia, unspecified organism: Secondary | ICD-10-CM | POA: Diagnosis not present

## 2020-03-25 DIAGNOSIS — R509 Fever, unspecified: Secondary | ICD-10-CM | POA: Diagnosis not present

## 2020-03-25 DIAGNOSIS — J9600 Acute respiratory failure, unspecified whether with hypoxia or hypercapnia: Secondary | ICD-10-CM

## 2020-03-25 DIAGNOSIS — Z0189 Encounter for other specified special examinations: Secondary | ICD-10-CM

## 2020-03-25 DIAGNOSIS — Z6830 Body mass index (BMI) 30.0-30.9, adult: Secondary | ICD-10-CM

## 2020-03-25 LAB — COMPREHENSIVE METABOLIC PANEL
ALT: 190 U/L — ABNORMAL HIGH (ref 0–44)
AST: 141 U/L — ABNORMAL HIGH (ref 15–41)
Albumin: 4.1 g/dL (ref 3.5–5.0)
Alkaline Phosphatase: 188 U/L — ABNORMAL HIGH (ref 38–126)
Anion gap: 15 (ref 5–15)
BUN: 16 mg/dL (ref 6–20)
CO2: 25 mmol/L (ref 22–32)
Calcium: 8.9 mg/dL (ref 8.9–10.3)
Chloride: 94 mmol/L — ABNORMAL LOW (ref 98–111)
Creatinine, Ser: 0.87 mg/dL (ref 0.61–1.24)
GFR calc Af Amer: >60 mL/min (ref >60)
GFR calc non Af Amer: >60 mL/min (ref >60)
Glucose, Bld: 102 mg/dL — ABNORMAL HIGH (ref 70–99)
Potassium: 4.6 mmol/L (ref 3.5–5.1)
Sodium: 134 mmol/L — ABNORMAL LOW (ref 135–145)
Total Bilirubin: 1 mg/dL (ref 0.3–1.2)
Total Protein: 9.1 g/dL — ABNORMAL HIGH (ref 6.5–8.1)

## 2020-03-25 LAB — FERRITIN: Ferritin: 1808 ng/mL — ABNORMAL HIGH (ref 24–336)

## 2020-03-25 LAB — CBC WITH DIFFERENTIAL/PLATELET
Abs Immature Granulocytes: 0.03 10*3/uL (ref 0.00–0.07)
Basophils Absolute: 0 10*3/uL (ref 0.0–0.1)
Basophils Relative: 0 %
Eosinophils Absolute: 0 10*3/uL (ref 0.0–0.5)
Eosinophils Relative: 0 %
HCT: 48.2 % (ref 39.0–52.0)
Hemoglobin: 16.5 g/dL (ref 13.0–17.0)
Immature Granulocytes: 1 %
Lymphocytes Relative: 20 %
Lymphs Abs: 1 10*3/uL (ref 0.7–4.0)
MCH: 29.9 pg (ref 26.0–34.0)
MCHC: 34.2 g/dL (ref 30.0–36.0)
MCV: 87.3 fL (ref 80.0–100.0)
Monocytes Absolute: 0.2 10*3/uL (ref 0.1–1.0)
Monocytes Relative: 4 %
Neutro Abs: 4 10*3/uL (ref 1.7–7.7)
Neutrophils Relative %: 75 %
Platelets: 213 10*3/uL (ref 150–400)
RBC: 5.52 MIL/uL (ref 4.22–5.81)
RDW: 12 % (ref 11.5–15.5)
WBC: 5.3 10*3/uL (ref 4.0–10.5)
nRBC: 0 % (ref 0.0–0.2)

## 2020-03-25 LAB — TRIGLYCERIDES: Triglycerides: 160 mg/dL — ABNORMAL HIGH (ref <150)

## 2020-03-25 LAB — FIBRINOGEN: Fibrinogen: <60 mg/dL — CL (ref 210–475)

## 2020-03-25 LAB — RESPIRATORY PANEL BY RT PCR (FLU A&B, COVID)
Influenza A by PCR: NEGATIVE
Influenza B by PCR: NEGATIVE
SARS Coronavirus 2 by RT PCR: POSITIVE — AB

## 2020-03-25 LAB — C-REACTIVE PROTEIN: CRP: 16.8 mg/dL — ABNORMAL HIGH (ref <1.0)

## 2020-03-25 LAB — LACTIC ACID, PLASMA: Lactic Acid, Venous: 1.2 mmol/L (ref 0.5–1.9)

## 2020-03-25 LAB — LACTATE DEHYDROGENASE: LDH: 502 U/L — ABNORMAL HIGH (ref 98–192)

## 2020-03-25 LAB — D-DIMER, QUANTITATIVE: D-Dimer, Quant: 0.32 ug/mL-FEU (ref 0.00–0.50)

## 2020-03-25 LAB — TROPONIN I (HIGH SENSITIVITY)
Troponin I (High Sensitivity): 13 ng/L (ref <18)
Troponin I (High Sensitivity): 11 ng/L (ref <18)

## 2020-03-25 LAB — PROCALCITONIN: Procalcitonin: 0.1 ng/mL

## 2020-03-25 MED ORDER — SODIUM CHLORIDE 0.9 % IV SOLN: 100.0000 mg | Freq: Every day | INTRAVENOUS | Status: DC

## 2020-03-25 MED ORDER — ENOXAPARIN SODIUM 40 MG/0.4ML ~~LOC~~ SOLN
40.0000 mg | SUBCUTANEOUS | Status: DC
  Administered 2020-03-25 – 2020-04-28 (×35): 40 mg via SUBCUTANEOUS
  Filled 2020-03-25 (×36): qty 0.4

## 2020-03-25 MED ORDER — SODIUM CHLORIDE 0.9 % IV SOLN: 1200.0000 mg | Freq: Once | INTRAVENOUS | Status: DC

## 2020-03-25 MED ORDER — ALBUTEROL SULFATE HFA 108 (90 BASE) MCG/ACT IN AERS: 2.0000 | INHALATION_SPRAY | Freq: Once | RESPIRATORY_TRACT | Status: DC | PRN

## 2020-03-25 MED ORDER — ONDANSETRON HCL 4 MG/2ML IJ SOLN: 4.0000 mg | Freq: Four times a day (QID) | INTRAMUSCULAR | Status: DC | PRN

## 2020-03-25 MED ORDER — METHYLPREDNISOLONE SODIUM SUCC 125 MG IJ SOLR
0.5000 mg/kg | Freq: Two times a day (BID) | INTRAMUSCULAR | Status: AC
  Administered 2020-03-25 – 2020-03-28 (×6): 48.75 mg via INTRAVENOUS
  Filled 2020-03-25 (×6): qty 2

## 2020-03-25 MED ORDER — DIPHENHYDRAMINE HCL 50 MG/ML IJ SOLN: 50.0000 mg | Freq: Once | INTRAMUSCULAR | Status: DC | PRN

## 2020-03-25 MED ORDER — METHYLPREDNISOLONE SODIUM SUCC 125 MG IJ SOLR
125.0000 mg | Freq: Once | INTRAMUSCULAR | Status: AC
  Administered 2020-03-25: 125 mg via INTRAVENOUS
  Filled 2020-03-25: qty 2

## 2020-03-25 MED ORDER — METHYLPREDNISOLONE SODIUM SUCC 125 MG IJ SOLR: 125.0000 mg | Freq: Once | INTRAMUSCULAR | Status: DC | PRN

## 2020-03-25 MED ORDER — SODIUM CHLORIDE 0.9 % IV SOLN: INTRAVENOUS | Status: DC | PRN

## 2020-03-25 MED ORDER — ACETAMINOPHEN 325 MG PO TABS: 650.0000 mg | ORAL_TABLET | Freq: Four times a day (QID) | ORAL | Status: DC | PRN

## 2020-03-25 MED ORDER — SODIUM CHLORIDE 0.9 % IV SOLN
100.0000 mg | Freq: Every day | INTRAVENOUS | Status: AC
  Administered 2020-03-26 – 2020-03-29 (×4): 100 mg via INTRAVENOUS
  Filled 2020-03-25 (×5): qty 20

## 2020-03-25 MED ORDER — ONDANSETRON HCL 4 MG PO TABS: 4.0000 mg | ORAL_TABLET | Freq: Four times a day (QID) | ORAL | Status: DC | PRN

## 2020-03-25 MED ORDER — FAMOTIDINE IN NACL 20-0.9 MG/50ML-% IV SOLN: 20.0000 mg | Freq: Once | INTRAVENOUS | Status: DC | PRN

## 2020-03-25 MED ORDER — EPINEPHRINE 0.3 MG/0.3ML IJ SOAJ: 0.3000 mg | Freq: Once | INTRAMUSCULAR | Status: DC | PRN

## 2020-03-25 MED ORDER — ACETAMINOPHEN 325 MG PO TABS
650.0000 mg | ORAL_TABLET | Freq: Four times a day (QID) | ORAL | Status: DC | PRN
  Administered 2020-03-26 – 2020-04-01 (×4): 650 mg via ORAL
  Filled 2020-03-25 (×4): qty 2

## 2020-03-25 MED ORDER — SODIUM CHLORIDE 0.9 % IV SOLN
200.0000 mg | Freq: Once | INTRAVENOUS | Status: AC
  Administered 2020-03-25: 200 mg via INTRAVENOUS
  Filled 2020-03-25: qty 200

## 2020-03-25 MED ORDER — ALBUTEROL SULFATE HFA 108 (90 BASE) MCG/ACT IN AERS
2.0000 | INHALATION_SPRAY | Freq: Four times a day (QID) | RESPIRATORY_TRACT | Status: DC
  Administered 2020-03-25 – 2020-03-26 (×5): 2 via RESPIRATORY_TRACT
  Filled 2020-03-25: qty 6.7

## 2020-03-25 MED ORDER — SODIUM CHLORIDE 0.9 % IV SOLN: 200.0000 mg | Freq: Once | INTRAVENOUS | Status: DC

## 2020-03-25 MED ORDER — PREDNISONE 20 MG PO TABS: 50.0000 mg | ORAL_TABLET | Freq: Every day | ORAL | Status: DC

## 2020-03-25 MED ORDER — GUAIFENESIN-DM 100-10 MG/5ML PO SYRP
10.0000 mL | ORAL_SOLUTION | ORAL | Status: DC | PRN
  Administered 2020-04-01: 10 mL via ORAL
  Filled 2020-03-25 (×3): qty 10

## 2020-03-25 NOTE — Progress Notes (Signed)
Patient arrived to the infusion center and was short of breath. Patient was wheeled in and became a little winded when getting out of th wheel chair to the recliner chair. The patient's 02 sats on the left pointer finger was 87%. The patient was asked to take deep breaths, cough, and even change his position. The 02 level still did not rise. The pulse ox was switched to the right index finger and his 02 sats increased to 88%. After 10 mins of waiting to see If the 02 level would increase, I notified NP Earl Lites about the situation. He assessed the patient and orders were given for oxygen 1-2liters titrate 90-93%. The patient was placed on 2 liters of oxygen which increased his 02 sats to 93%. The recommendation from NP Earl Lites was to transport the patient to the emergency room for further evaluation.

## 2020-03-25 NOTE — ED Notes (Signed)
Pt's o2sat remaining in the 80s. Pt states he feels ok and does not feel shob. Upon assessment, pt appears to be in no distress. Pts o2 increased to 10L HFNC at this time.

## 2020-03-25 NOTE — Progress Notes (Signed)
Mr. Dennis Howe was scheduled to receive Regeneron this morning and during the check in process SP02 was noted to be 89% on RA on multiple fingers with good waves forms. Had noted that his breathing got worse yesterday and was somewhat improved today. Denies any headaches or dizziness. Air exchange is good and he is overall stable with the exception of his SPO2. Discussed with Mr. Dennis Howe that the findings of low oxygen levels are a contraindication for Regeneron and it was been shown to worsen respiratory status is patients with low O2. He was placed on nasal cannula with orders to titrate to 92% while awaiting evaluation in the ED. ED notified of need for transfer. He is currently stable and will continue to monitor. Will call rapid response should his symptoms worsen/deteroiate.   Marcos Eke, NP 03/25/2020 10:46 AM

## 2020-03-25 NOTE — ED Triage Notes (Signed)
Patient from infusion center, went to get antibody infusion this morning. Was unable to get infusion as O2 87% on RA. Sent for further evaluation. 91% on 2L. A&Ox4.

## 2020-03-25 NOTE — ED Provider Notes (Signed)
[facility]-EMERGENCY DEPT Provider Note   CSN: [id number] Arrival date & time: 03/25/20  [id number]     History Chief Complaint  Patient presents with  . Covid Positive    Dennis Howe is a 44 y.o. male presents to the ER for evaluation of shortness of breath and chest pain that began last night.  Patient has had Covid symptoms since September 25.  Tested positive on September 28.  Symptoms include fever, congestion, cough with phlegm, vomiting, diarrhea, loss of taste and smell, generalized malaise.  States the vomiting and diarrhea have stopped. Last time he had fever was 2-3 days ago. No sore throat or abdominal pain.  Seen in the ER couple days ago and had a chest x-ray and lab work and deemed appropriate for discharge.  He was given antibody infusion clinic contact information.  Patient actually went there this morning to get infusion was but noted to be hypoxic with SPO2 87% on room air and transferred to the ER for evaluation.  States that last night he developed gradual but worsening central chest pain that is worse with exertion and associated with shortness of breath.  The chest pain is relieved with rest but has continued to feel shortness of breath even while he sits down.  Could not sleep last night due to chest pain and shortness of breath.  Has been taking medicines prescribed to him from the ER a few days ago.  States most of the symptoms have gotten better except the chest pain and shortness of breath are new.  Unvaccinated for COVID-19.  No known cardiac or pulmonary medical conditions.  No tobacco use.  No leg swelling, calf pain, hemoptysis.  HPI     Past Medical History:  Diagnosis Date  . BMI 30.0-30.9,adult     Patient Active Problem List   Diagnosis Date Noted  . Acute hypoxemic respiratory failure due to COVID-19 (HCC) 03/25/2020  . BMI 30.0-30.9,adult     History reviewed. No pertinent surgical history.     History reviewed. No  pertinent family history.  Social History   Tobacco Use  . Smoking status: Not on file  Substance Use Topics  . Alcohol use: Not on file  . Drug use: Not on file    Home Medications Prior to Admission medications   Medication Sig Start Date End Date Taking? Authorizing Provider  benzonatate (TESSALON) 100 MG capsule Take 1 capsule (100 mg total) by mouth every 8 (eight) hours. 03/23/20  Yes Fondaw, Wylder S, PA  ondansetron (ZOFRAN ODT) 4 MG disintegrating tablet Take 1 tablet (4 mg total) by mouth every 8 (eight) hours as needed for nausea or vomiting. 03/23/20  Yes Fondaw, Wylder S, PA  promethazine-dextromethorphan (PROMETHAZINE-DM) 6.25-15 MG/5ML syrup Take 5 mLs by mouth 4 (four) times daily as needed for cough. 03/23/20  Yes Gailen ShelterFondaw, Wylder S, PA    Allergies    Patient has no known allergies.  Review of Systems   Review of Systems  Respiratory: Positive for cough and shortness of breath.   Cardiovascular: Positive for chest pain.  All other systems reviewed and are negative.   Physical Exam Updated Vital Signs BP 123/85   Pulse 74   Temp 99.9 F (37.7 C) (Oral)   Resp (!) 27   Ht 5\' 11"  (1.803 m)   Wt 97.5 kg   SpO2 91%   BMI 29.99 kg/m   Physical Exam Vitals and nursing note reviewed.  Constitutional:  General: He is not in acute distress.    Appearance: He is well-developed.     Comments: NAD.  HENT:     Head: Normocephalic and atraumatic.     Right Ear: External ear normal.     Left Ear: External ear normal.     Nose: Nose normal.  Eyes:     General: No scleral icterus.    Conjunctiva/sclera: Conjunctivae normal.  Cardiovascular:     Rate and Rhythm: Normal rate and regular rhythm.     Heart sounds: Normal heart sounds.     Comments: No lower extremity edema.  No calf tenderness.  No murmurs. Pulmonary:     Effort: Respiratory distress present.     Breath sounds: Rhonchi present.     Comments: Patient speaking in full sentences, noted to be  slightly dyspneic with prolonged conversation.  SPO2 90% on 2 L [place], this was increased to 3 L.  Scattered subtle rhonchi diffusely worse on the left side, slightly diminished in the right lower lobe. Musculoskeletal:        General: No deformity. Normal range of motion.     Cervical back: Normal range of motion and neck supple.  Skin:    General: Skin is warm and dry.     Capillary Refill: Capillary refill takes less than 2 seconds.  Neurological:     Mental Status: He is alert and oriented to person, place, and time.  Psychiatric:        Behavior: Behavior normal.        Thought Content: Thought content normal.        Judgment: Judgment normal.     ED Results / Procedures / Treatments   Labs (all labs ordered are listed, but only abnormal results are displayed) Labs Reviewed  COMPREHENSIVE METABOLIC PANEL - Abnormal; Notable for the following components:      Result Value   Sodium 134 (*)    Chloride 94 (*)    Glucose, Bld 102 (*)    Total Protein 9.1 (*)    AST 141 (*)    ALT 190 (*)    Alkaline Phosphatase 188 (*)    All other components within normal limits  LACTATE DEHYDROGENASE - Abnormal; Notable for the following components:   LDH 502 (*)    All other components within normal limits  TRIGLYCERIDES - Abnormal; Notable for the following components:   Triglycerides 160 (*)    All other components within normal limits  FIBRINOGEN - Abnormal; Notable for the following components:   Fibrinogen <60 (*)    All other components within normal limits  RESPIRATORY PANEL BY RT PCR (FLU A&B, COVID)  CULTURE, BLOOD (ROUTINE X 2)  CULTURE, BLOOD (ROUTINE X 2)  LACTIC ACID, PLASMA  CBC WITH DIFFERENTIAL/PLATELET  D-DIMER, QUANTITATIVE (NOT AT [facility])  LACTIC ACID, PLASMA  PROCALCITONIN  FERRITIN  C-REACTIVE PROTEIN  TROPONIN I (HIGH SENSITIVITY)  TROPONIN I (HIGH SENSITIVITY)    EKG None  Radiology DG Chest Port 1 View  Result Date: 03/25/2020 CLINICAL DATA:  Patient  from infusion center, went to get antibody infusion this morning. Was unable to get infusion as O2 87% on RA. EXAM: PORTABLE CHEST 1 VIEW COMPARISON:  Chest radiograph 03/23/2020 FINDINGS: Stable cardiomediastinal contours. Low lung volumes. There are persistent diffuse patchy predominantly peripheral opacities in the left greater than right lungs, similar to prior, consistent with multifocal pneumonia. No pneumothorax or large pleural effusion. No acute finding in the visualized skeleton. IMPRESSION: Diffuse bilateral opacities,  predominantly peripheral, consistent with multifocal infection. Electronically Signed   By: Emmaline Kluver M.D.   On: 03/25/2020 13:01    Procedures .Critical Care Performed by: Liberty Handy, PA-C Authorized by: Liberty Handy, PA-C   Critical care provider statement:    Critical care time (minutes):  45   Critical care was necessary to treat or prevent imminent or life-threatening deterioration of the following conditions:  Respiratory failure   Critical care was time spent personally by me on the following activities:  Discussions with consultants, evaluation of patient's response to treatment, examination of patient, ordering and performing treatments and interventions, ordering and review of laboratory studies, ordering and review of radiographic studies, pulse oximetry, re-evaluation of patient's condition, obtaining history from patient or surrogate, review of old charts and development of treatment plan with patient or surrogate   I assumed direction of critical care for this patient from another provider in my specialty: no     (including critical care time)  Medications Ordered in ED Medications  remdesivir 200 mg in sodium chloride 0.9% 250 mL IVPB (has no administration in time range)    Followed by  remdesivir 100 mg in sodium chloride 0.9 % 100 mL IVPB (has no administration in time range)  methylPREDNISolone sodium succinate (SOLU-MEDROL) 125  mg/2 mL injection 125 mg (125 mg Intravenous Given 03/25/20 1318)    ED Course  I have reviewed the triage vital signs and the nursing notes.  Pertinent labs & imaging results that were available during my care of the patient were reviewed by me and considered in my medical decision making (see chart for details).  Clinical Course as of Mar 25 1433  Sat Mar 25, 2020  1418 Re-evaluated SpO2 >90%. No clinical deterioration. Asked RN to collect covid swab   [CG]    Clinical Course User Index [CG] Jerrell Mylar   MDM Rules/Calculators/A&P                          Patient is EMR, triage nursing notes reviewed to obtain more history and assist with MDM.  Seen recently in the ER for Covid related symptoms, work-up at that time was benign and he was discharged with symptomatic management and referral to monoclonal antibody clinic.  Patient arrives hypoxic with SPO2 87% on room air, requiring 3 L [place] to be greater than 90%.  I have ordered Covid preadmission order set including chest x-ray, EKG, lab work including inflammatory markers, troponin..    Reports exertional chest pain that is central and relieved with rest, suspect this is secondary to viral illness vs MSK vs costochondritis .  Less likely acute coronary syndrome, dissection, PE, myocarditis   1430 ER work-up personally visualized and interpreted.  Covid test is pending but patient reports positive test on 9/28 OSH.  Inflammatory markers surprisingly not very elevated.  LDH, triglycerides, LFTs elevated.  He has no abdominal tenderness.  No longer having vomiting or diarrhea.  Doubt gallbladder/liver acute surgical process or cholecystitis.  Chest x-ray shows evidence of multifocal pneumonia which fits clinical picture.  Troponin is 13, pending delta.  No leukocytosis, lactic acidosis.  Patient reevaluated and stable on 3 L .  I have ordered Solu-Medrol, remdesivir per pharmacist.  Discussed with hospitalist Dr.  Henrene Hawking who has admitted patient for respiratory failure secondary to Covid pneumonia. Final Clinical Impression(s) / ED Diagnoses Final diagnoses:  Acute hypoxemic respiratory failure due to COVID-19 [facility])  Rx / DC Orders ED Discharge Orders    None       Jerrell Mylar 03/25/20 1434    Rolan Bucco, MD 03/25/20 [phone]

## 2020-03-25 NOTE — ED Notes (Signed)
Patients wife, [name], [phone].

## 2020-03-25 NOTE — H&P (Signed)
History and Physical        Hospital Admission Note Date: 03/25/2020  Patient name: Dennis Howe Medical record number: [id number] Date of birth: December 30, 1975 Age: 44 y.o. Gender: male  PCP: Pcp, No  Patient coming from: Home Lives with: Wife At baseline, ambulates: Independently  Chief Complaint    Chief Complaint  Patient presents with  . Covid Positive      HPI:   This is a 44 year old male without significant history who has not been vaccinated against COVID 19 who recently tested positive for COVID 19 at an outside facility on 03/21/20 (record not available) who complains of intermittent substernal chest pain and shortness of breath worsened by exertion since last night. Chest pain has been gradual but worsening, noted to be centralized and worse with exertion and relieved with rest. He could not sleep last night due to the chest pain. His COVID 19 symptoms started on 9/25 and include fever, congestion, productive cough, vomiting, diarrhea, loss of taste and smell, and generalized malaise. He was recently seen in the ED on 9/30 for nausea and vomiting and was sent home in stable condition with conservative treatments. He was referred to the infusion center and presented this morning for a monoclonal antibody infusion but was found to be hypoxic 87% on room air and was sent to the ED for evaluation.  No known cardiac or pulmonary history, no known diabetes or hyperlipidemia history, no tobacco use, no leg swelling, calf pain or hemoptysis.  No family history of cardiac issues.  ED Course: Afebrile, tachypnic, hypoxic requiring 2 L/min O2, Normotensive. Notable Labs: Na 134, Cl 94, Glucose 102, Alk phos 188, AST 141, ALT 190 (previously 90, 61, 83 respectively on 9/30), LDH 502, Troponin 13, Lactic acid 1.2, WBC 5.3, D Dimer 0.32.  He was given remdesivir and  Solu-Medrol  Vitals:   03/25/20 1500 03/25/20 1516  BP: (!) 146/88   Pulse: 87   Resp: (!) 27   Temp:  99.9 F (37.7 C)  SpO2: 91%      Review of Systems:  Review of Systems  Constitutional: Positive for malaise/fatigue. Negative for chills.  Respiratory: Positive for cough and shortness of breath. Negative for hemoptysis.   Cardiovascular: Positive for chest pain. Negative for palpitations and leg swelling.  Gastrointestinal: Negative for blood in stool and nausea.  Genitourinary: Negative.   Musculoskeletal:       Generalized myalgias  Skin: Negative for rash.  All other systems reviewed and are negative.   Medical/Social/Family History   Past Medical History: Past Medical History:  Diagnosis Date  . BMI 30.0-30.9,adult     History reviewed. No pertinent surgical history.  Medications: Prior to Admission medications   Medication Sig Start Date End Date Taking? Authorizing Provider  benzonatate (TESSALON) 100 MG capsule Take 1 capsule (100 mg total) by mouth every 8 (eight) hours. 03/23/20  Yes Fondaw, Wylder S, PA  ondansetron (ZOFRAN ODT) 4 MG disintegrating tablet Take 1 tablet (4 mg total) by mouth every 8 (eight) hours as needed for nausea or vomiting. 03/23/20  Yes Fondaw, Wylder S, PA  promethazine-dextromethorphan (PROMETHAZINE-DM) 6.25-15 MG/5ML syrup Take 5 mLs by mouth 4 (four) times daily  as needed for cough. 03/23/20  Yes Fondaw, Ova Freshwater S, PA    Allergies:  No Known Allergies  Social History:  has no history on file for tobacco use, alcohol use, and drug use.  Family History: History reviewed. No pertinent family history.   Objective   Physical Exam: Blood pressure (!) 146/88, pulse 87, temperature 99.9 F (37.7 C), resp. rate (!) 27, height 5' 11" (1.803 m), weight 97.5 kg, SpO2 91 %.  Physical Exam Vitals and nursing note reviewed.  Constitutional:      General: He is not in acute distress.    Appearance: He is not ill-appearing.  HENT:      Head: Normocephalic.  Eyes:     Pupils: Pupils are equal, round, and reactive to light.  Cardiovascular:     Rate and Rhythm: Normal rate and regular rhythm.     Pulses: Normal pulses.  Pulmonary:     Effort: No respiratory distress.     Breath sounds: No wheezing.     Comments: Mild conversational dyspnea Abdominal:     General: Abdomen is flat. There is no distension.  Musculoskeletal:        General: No swelling or tenderness.     Right lower leg: No edema.     Left lower leg: No edema.  Skin:    General: Skin is warm.     Coloration: Skin is not jaundiced.  Neurological:     General: No focal deficit present.     Mental Status: He is alert.  Psychiatric:        Mood and Affect: Mood normal.        Behavior: Behavior normal.     LABS on Admission: I have personally reviewed all the labs and imaging below    Basic Metabolic Panel: Recent Labs  Lab 03/23/20 1335 03/25/20 1310  NA 132* 134*  K 4.1 4.6  CL 96* 94*  CO2 23 25  GLUCOSE 125* 102*  BUN 21* 16  CREATININE 0.94 0.87  CALCIUM 8.6* 8.9   Liver Function Tests: Recent Labs  Lab 03/23/20 1335 03/25/20 1310  AST 61* 141*  ALT 83* 190*  ALKPHOS 90 188*  BILITOT 0.6 1.0  PROT 7.4 9.1*  ALBUMIN 3.5 4.1   No results for input(s): LIPASE, AMYLASE in the last 168 hours. No results for input(s): AMMONIA in the last 168 hours. CBC: Recent Labs  Lab 03/23/20 1335 03/23/20 1335 03/25/20 1310  WBC 4.3  --  5.3  NEUTROABS  --   --  4.0  HGB 14.9  --  16.5  HCT 44.7  --  48.2  MCV 88.2   < > 87.3  PLT 152  --  213   < > = values in this interval not displayed.   Cardiac Enzymes: No results for input(s): CKTOTAL, CKMB, CKMBINDEX, TROPONINI in the last 168 hours. BNP: Invalid input(s): POCBNP CBG: No results for input(s): GLUCAP in the last 168 hours.  Radiological Exams on Admission:  DG Chest Port 1 View  Result Date: 03/25/2020 CLINICAL DATA:  Patient from infusion center, went to get  antibody infusion this morning. Was unable to get infusion as O2 87% on RA. EXAM: PORTABLE CHEST 1 VIEW COMPARISON:  Chest radiograph 03/23/2020 FINDINGS: Stable cardiomediastinal contours. Low lung volumes. There are persistent diffuse patchy predominantly peripheral opacities in the left greater than right lungs, similar to prior, consistent with multifocal pneumonia. No pneumothorax or large pleural effusion. No acute finding in the visualized skeleton.  IMPRESSION: Diffuse bilateral opacities, predominantly peripheral, consistent with multifocal infection. Electronically Signed   By: Audie Pinto M.D.   On: 03/25/2020 13:01      EKG: Independently reviewed.  Normal axis, sinus rhythm, noted S wave in lead I, Q wave in lead III and T wave inversion in lead III and aVF similar to prior EKG on 9/30   A & P   Principal Problem:   Acute hypoxemic respiratory failure due to COVID-19 [facility]) Active Problems:   Chest pain   Elevated LFTs   1. Acute hypoxic respiratory failure secondary to COVID-19 O2 Requirements: 0 L/min at baseline, currently 2 L/min CRP 16.8, D Dimer 0.32, ferritin 1808, procalcitonin negative - hold off antibiotics for now Remdesivir x 5 days, Steroids x 10 days Inhalers, antitussives Encourage Incentive Spirometry, Flutter Valve and Pronation as tolerated  2. Exertional chest pain a. Not reproducible on exam and without a known cardiac history b. Troponin negative x1 -> repeat  c. EKG with S wave in lead I, Q wave in lead III and T wave inversion in lead III and aVF similar to prior EKG on 9/30  d. Negative D Dimer -> hold off on a CT scan for now but check an Echo to rule out any right heart strain or other wall motion abnormality e. Continue telemetry  3. Asymptomatic Viral induced elevated LFTs   DVT prophylaxis: lovenox   Code Status: Not on file  Diet: regular Family Communication: Admission, patients condition and plan of care including tests being  ordered have been discussed with the patient who indicates understanding and agrees with the plan and Code Status. Patient requested to update his wife on his own  Disposition Plan: The appropriate patient status for this patient is INPATIENT. Inpatient status is judged to be reasonable and necessary in order to provide the required intensity of service to ensure the patient's safety. The patient's presenting symptoms, physical exam findings, and initial radiographic and laboratory data in the context of their chronic comorbidities is felt to place them at high risk for further clinical deterioration. Furthermore, it is not anticipated that the patient will be medically stable for discharge from the hospital within 2 midnights of admission. The following factors support the patient status of inpatient.   " The patient's presenting symptoms include chest pain, shortness of breath. " The worrisome physical exam findings include dyspnea with conversation. " The initial radiographic and laboratory data are worrisome because of elevated inflammatory markers. " The chronic co-morbidities include none.   * I certify that at the point of admission it is my clinical judgment that the patient will require inpatient hospital care spanning beyond 2 midnights from the point of admission due to high intensity of service, high risk for further deterioration and high frequency of surveillance required.*  Consultants  . None  Procedures  . None  Time Spent on Admission: 60 minutes    Harold Hedge, DO [facility]  03/25/2020, 3:35 PM

## 2020-03-25 NOTE — ED Notes (Signed)
Date and time results received: 03/25/20 2:25 PM  (use smartphrase ".now" to insert current time)  Test: Fibrinogen  Critical Value: <60  Name of Provider Notified: Karleen Hampshire, RN  Orders Received? Or Actions Taken?: Actions Taken: Dr. Fredderick Phenix

## 2020-03-25 NOTE — ED Notes (Signed)
Patient received dinner tray 

## 2020-03-26 ENCOUNTER — Inpatient Hospital Stay (HOSPITAL_COMMUNITY): Payer: 59

## 2020-03-26 DIAGNOSIS — J9601 Acute respiratory failure with hypoxia: Secondary | ICD-10-CM | POA: Diagnosis not present

## 2020-03-26 DIAGNOSIS — R079 Chest pain, unspecified: Secondary | ICD-10-CM | POA: Diagnosis not present

## 2020-03-26 DIAGNOSIS — U071 COVID-19: Secondary | ICD-10-CM | POA: Diagnosis not present

## 2020-03-26 LAB — FERRITIN: Ferritin: 1492 ng/mL — ABNORMAL HIGH (ref 24–336)

## 2020-03-26 LAB — CBC WITH DIFFERENTIAL/PLATELET
Abs Immature Granulocytes: 0.07 10*3/uL (ref 0.00–0.07)
Basophils Absolute: 0 10*3/uL (ref 0.0–0.1)
Basophils Relative: 0 %
Eosinophils Absolute: 0 10*3/uL (ref 0.0–0.5)
Eosinophils Relative: 0 %
HCT: 42.1 % (ref 39.0–52.0)
Hemoglobin: 14.5 g/dL (ref 13.0–17.0)
Immature Granulocytes: 1 %
Lymphocytes Relative: 7 %
Lymphs Abs: 0.6 10*3/uL — ABNORMAL LOW (ref 0.7–4.0)
MCH: 29.8 pg (ref 26.0–34.0)
MCHC: 34.4 g/dL (ref 30.0–36.0)
MCV: 86.6 fL (ref 80.0–100.0)
Monocytes Absolute: 0.4 10*3/uL (ref 0.1–1.0)
Monocytes Relative: 5 %
Neutro Abs: 7.1 10*3/uL (ref 1.7–7.7)
Neutrophils Relative %: 87 %
Platelets: 214 10*3/uL (ref 150–400)
RBC: 4.86 MIL/uL (ref 4.22–5.81)
RDW: 12 % (ref 11.5–15.5)
WBC: 8.1 10*3/uL (ref 4.0–10.5)
nRBC: 0 % (ref 0.0–0.2)

## 2020-03-26 LAB — COMPREHENSIVE METABOLIC PANEL
ALT: 167 U/L — ABNORMAL HIGH (ref 0–44)
AST: 103 U/L — ABNORMAL HIGH (ref 15–41)
Albumin: 3.1 g/dL — ABNORMAL LOW (ref 3.5–5.0)
Alkaline Phosphatase: 178 U/L — ABNORMAL HIGH (ref 38–126)
Anion gap: 13 (ref 5–15)
BUN: 19 mg/dL (ref 6–20)
CO2: 23 mmol/L (ref 22–32)
Calcium: 8.1 mg/dL — ABNORMAL LOW (ref 8.9–10.3)
Chloride: 96 mmol/L — ABNORMAL LOW (ref 98–111)
Creatinine, Ser: 0.77 mg/dL (ref 0.61–1.24)
GFR calc Af Amer: >60 mL/min (ref >60)
GFR calc non Af Amer: >60 mL/min (ref >60)
Glucose, Bld: 153 mg/dL — ABNORMAL HIGH (ref 70–99)
Potassium: 4.1 mmol/L (ref 3.5–5.1)
Sodium: 132 mmol/L — ABNORMAL LOW (ref 135–145)
Total Bilirubin: 0.8 mg/dL (ref 0.3–1.2)
Total Protein: 7.6 g/dL (ref 6.5–8.1)

## 2020-03-26 LAB — ECHOCARDIOGRAM LIMITED
Area-P 1/2: 2.45 cm2
Calc EF: 54.9 %
Height: 71 in
Single Plane A2C EF: 57 %
Single Plane A4C EF: 56.3 %
Weight: 3440 oz

## 2020-03-26 LAB — D-DIMER, QUANTITATIVE: D-Dimer, Quant: 0.38 ug/mL-FEU (ref 0.00–0.50)

## 2020-03-26 LAB — HIV ANTIBODY (ROUTINE TESTING W REFLEX): HIV Screen 4th Generation wRfx: NONREACTIVE

## 2020-03-26 LAB — C-REACTIVE PROTEIN: CRP: 16.5 mg/dL — ABNORMAL HIGH (ref <1.0)

## 2020-03-26 MED ORDER — TOCILIZUMAB 400 MG/20ML IV SOLN
800.0000 mg | Freq: Once | INTRAVENOUS | Status: AC
  Administered 2020-03-26: 800 mg via INTRAVENOUS
  Filled 2020-03-26: qty 40

## 2020-03-26 NOTE — ED Notes (Signed)
Pt wife called, pt ok with RN talking with her and giving update. States she is bringing items by for pt.

## 2020-03-26 NOTE — ED Notes (Signed)
Rounded on pt, pt was sweating. Took temp 101.5 gave prn tylenol. Will continue to monitor.

## 2020-03-26 NOTE — ED Notes (Addendum)
Attempted to wean patient off NRB. Patient remained on high flow nasal cannula. Oxygen saturation dropped to 80% within 5 minutes. NRB reapplied. Patient continues to deny SOB.

## 2020-03-26 NOTE — Progress Notes (Signed)
  Echocardiogram 2D Echocardiogram has been performed.  Dennis Howe 03/26/2020, 8:38 AM

## 2020-03-26 NOTE — ED Notes (Signed)
Assumed care of patient at this time, nad noted, sr up x2, bed locked and low, call bell w/I reach.  Will continue to monitor. ° °

## 2020-03-26 NOTE — ED Notes (Signed)
Patient given meal tray.

## 2020-03-26 NOTE — Progress Notes (Signed)
CODE STATUS clarification,  On admission, patient had a stated that he does not want to be hooked up on the machine.  We explained to him about CPR, mechanical intubation and ventilation support and how it is done.  Patient is okay to go for intubation and mechanical ventilation support for reversible causes, he does not want to be hooked on on the machine for a long time.  Patient says if he is not improving on the machine he does not want to be prolonged life.  Plan: With patient's potential reversible condition, he is full code with full scope of treatment.

## 2020-03-26 NOTE — ED Notes (Signed)
Patient diaphoretic after administration of tylenol. Oral temperature 99.4. Encouraged patient to prone. Patient reports discomfort when in prone position. Changed patient into clean gown and provided with fresh ice and water.

## 2020-03-26 NOTE — ED Notes (Signed)
Provided patient's wife with update.

## 2020-03-26 NOTE — ED Notes (Signed)
Patient is resting comfortably. 

## 2020-03-26 NOTE — Progress Notes (Signed)
PROGRESS NOTE    Dennis Howe  KDX:[id number] [name]: [birth year] DOA: 03/25/2020 PCP: Pcp, No    Brief Narrative:  44 year old gentleman with no significant medical history, not vaccinated against COVID-19 presented to the ER with intermittent substernal chest pain and shortness of breath worsening for 1 day, diagnosed with COVID-19 on 9/28 at outside facility.  He was also seen in the emergency room on 9/30 for nausea and vomiting and discharged home on conservative management.  Continue to have worsening symptoms so came back to the ER. In the emergency room, afebrile, tachypneic and tachycardic.  Initially requiring 2 L of oxygen.  Chest x-ray with bilateral consolidation.  Admitted with steroids and remdesivir. 10/3: Apparently patient was on 2 to 3 L of oxygen on admission, he is on 15 L nonrebreather today.   Assessment & Plan:   Principal Problem:   Acute hypoxemic respiratory failure due to COVID-19 [facility]) Active Problems:   Chest pain   Elevated LFTs  Acute hypoxemic respiratory failure due to COVID-19 virus pneumonia: Continue to monitor due to significant symptoms, requiring very high level of oxygen. chest physiotherapy, incentive spirometry, deep breathing exercises, sputum induction, mucolytic's and bronchodilators. Supplemental oxygen to keep saturations more than 85 %. Covid directed therapy with , steroids, high-dose Solu-Medrol remdesivir, day 2/5 actemra, will give 1 dose 800 mg today. Commonly known as rheumatoid arthritis drug.  Experimental drug.  Different  studies have shown different results, recent studies have shown some benefits if used early.  This may or may not benefit in your case.  Discussed different side effects including secondary bacterial infections, reactivation of tuberculosis or hepatitis.  This patient does not have any absolute contraindication to receive Tocilizumab. patient is aware about the disease and agreeable to use this experimental  medicine as potential survival benefits outweigh risks of side effects as his oxygen requirement is acutely worsening. antibiotics, not indicated Due to severity of symptoms, patient will need daily inflammatory markers, chest x-rays, liver function test to monitor and direct COVID-19 therapies. Mobility.  Prone positioning.  COVID-19 Labs  Recent Labs    03/25/20 1310 03/26/20 0406 03/26/20 0436  DDIMER 0.32 0.38  --   FERRITIN 1,808*  --  1,492*  LDH 502*  --   --   CRP 16.8*  --  16.5*    Lab Results  Component Value Date   SARSCOV2NAA POSITIVE (A) 03/25/2020   SpO2: 91 % O2 Flow Rate (L/min): 15 L/min  Abnormal liver function test: Consistent with viral infection.  Will need close monitoring.  DVT prophylaxis: enoxaparin (LOVENOX) injection 40 mg Start: 03/25/20 2200 SCDs Start: 03/25/20 1721   Code Status: Partial.  Patient opted not to go for intubation.  I have clearly communicated with him.  I have asked him to talk to his wife about this. Family Communication: Wife on the phone.  I have encouraged her to communicate with her husband about confirming his desire not to be intubated. Patient is currently on high flow oxygen, he still has more noninvasive options available before mechanical intubation. Disposition Plan: Status is: Inpatient  Remains inpatient appropriate because:Inpatient level of care appropriate due to severity of illness   Dispo: The patient is from: Home              Anticipated d/c is to: Home              Anticipated d/c date is: > 3 days  Patient currently is not medically stable to d/c.         Consultants:   None  Procedures:   None  Antimicrobials:  Anti-infectives (From admission, onward)   Start     Dose/Rate Route Frequency Ordered Stop   03/26/20 1000  remdesivir 100 mg in sodium chloride 0.9 % 100 mL IVPB       "Followed by" Linked Group Details   100 mg 200 mL/hr over 30 Minutes Intravenous Daily  03/25/20 1249 03/30/20 0959   03/26/20 1000  remdesivir 100 mg in sodium chloride 0.9 % 100 mL IVPB  Status:  Discontinued       "Followed by" Linked Group Details   100 mg 200 mL/hr over 30 Minutes Intravenous Daily 03/25/20 1720 03/25/20 1730   03/25/20 1720  remdesivir 200 mg in sodium chloride 0.9% 250 mL IVPB  Status:  Discontinued       "Followed by" Linked Group Details   200 mg 580 mL/hr over 30 Minutes Intravenous Once 03/25/20 1720 03/25/20 1730   03/25/20 1500  remdesivir 200 mg in sodium chloride 0.9% 250 mL IVPB       "Followed by" Linked Group Details   200 mg 580 mL/hr over 30 Minutes Intravenous Once 03/25/20 1249 03/25/20 1702         Subjective: Patient seen and examined.  Still in the emergency room.  Patient himself feels okay.  Overnight events noted.  Patient is on escalating oxygen now and remains on nonrebreather along with supplemental nasal cannula.  Objective: Vitals:   03/26/20 1335 03/26/20 1343 03/26/20 1406 03/26/20 1413  BP:   (!) 138/94   Pulse: 77 80 82 81  Resp: (!) 28 (!) 29 20 (!) 32  Temp:   99 F (37.2 C)   TempSrc:   Oral   SpO2: 91% 92% 90% 91%  Weight:      Height:        Intake/Output Summary (Last 24 hours) at 03/26/2020 1431 Last data filed at 03/25/2020 2150 Gross per 24 hour  Intake --  Output 400 ml  Net -400 ml   Filed Weights   03/25/20 1328  Weight: 97.5 kg    Examination:  General exam: Appears calm with mild respiratory distress on talking. Respiratory system: No added sound.  Respiratory effort normal.  Poor bilateral air entry. Cardiovascular system: S1 & S2 heard, RRR. No JVD, murmurs, rubs, gallops or clicks. No pedal edema. Gastrointestinal system: Abdomen is nondistended, soft and nontender. No organomegaly or masses felt. Normal bowel sounds heard. Central nervous system: Alert and oriented. No focal neurological deficits. Extremities: Symmetric 5 x 5 power. Skin: No rashes, lesions or ulcers Psychiatry:  Judgement and insight appear normal. Mood & affect appropriate.     Data Reviewed: I have personally reviewed following labs and imaging studies  CBC: Recent Labs  Lab 03/23/20 1335 03/25/20 1310 03/26/20 0406  WBC 4.3 5.3 8.1  NEUTROABS  --  4.0 7.1  HGB 14.9 16.5 14.5  HCT 44.7 48.2 42.1  MCV 88.2 87.3 86.6  PLT 152 213 214   Basic Metabolic Panel: Recent Labs  Lab 03/23/20 1335 03/25/20 1310 03/26/20 0406  NA 132* 134* 132*  K 4.1 4.6 4.1  CL 96* 94* 96*  CO2 23 25 23   GLUCOSE 125* 102* 153*  BUN 21* 16 19  CREATININE 0.94 0.87 0.77  CALCIUM 8.6* 8.9 8.1*   GFR: Estimated Creatinine Clearance: 143.3 mL/min (by C-G formula based on SCr of  0.77 mg/dL). Liver Function Tests: Recent Labs  Lab 03/23/20 1335 03/25/20 1310 03/26/20 0406  AST 61* 141* 103*  ALT 83* 190* 167*  ALKPHOS 90 188* 178*  BILITOT 0.6 1.0 0.8  PROT 7.4 9.1* 7.6  ALBUMIN 3.5 4.1 3.1*   No results for input(s): LIPASE, AMYLASE in the last 168 hours. No results for input(s): AMMONIA in the last 168 hours. Coagulation Profile: No results for input(s): INR, PROTIME in the last 168 hours. Cardiac Enzymes: No results for input(s): CKTOTAL, CKMB, CKMBINDEX, TROPONINI in the last 168 hours. BNP (last 3 results) No results for input(s): PROBNP in the last 8760 hours. HbA1C: No results for input(s): HGBA1C in the last 72 hours. CBG: No results for input(s): GLUCAP in the last 168 hours. Lipid Profile: Recent Labs    03/25/20 1310  TRIG 160*   Thyroid Function Tests: No results for input(s): TSH, T4TOTAL, FREET4, T3FREE, THYROIDAB in the last 72 hours. Anemia Panel: Recent Labs    03/25/20 1310 03/26/20 0436  FERRITIN 1,808* 1,492*   Sepsis Labs: Recent Labs  Lab 03/25/20 1310  PROCALCITON 0.10  LATICACIDVEN 1.2    Recent Results (from the past 240 hour(s))  Respiratory Panel by RT PCR (Flu A&B, Covid) - Nasopharyngeal Swab     Status: Abnormal   Collection Time: 03/25/20   1:10 PM   Specimen: Nasopharyngeal Swab  Result Value Ref Range Status   SARS Coronavirus 2 by RT PCR POSITIVE (A) NEGATIVE Final    Comment: RESULT CALLED TO, READ BACK BY AND VERIFIED WITH: M.QUICK,RN [id number] @1759  BY V.WILKINS (NOTE) SARS-CoV-2 target nucleic acids are DETECTED.  SARS-CoV-2 RNA is generally detectable in upper respiratory specimens  during the acute phase of infection. Positive results are indicative of the presence of the identified virus, but do not rule out bacterial infection or co-infection with other pathogens not detected by the test. Clinical correlation with patient history and other diagnostic information is necessary to determine patient infection status. The expected result is Negative.  Fact Sheet for Patients:   Fact Sheet for Healthcare Providers: https://www.moore.com/  This test is not yet approved or cleared by the https://www.young.biz/ FDA and  has been authorized for detection and/or diagnosis of SARS-CoV-2 by FDA under an Emergency Use Authorization (EUA).  This EUA will remain in effect (meaning this test can b e used) for the duration of  the COVID-19 declaration under Section 564(b)(1) of the Act, 21 U.S.C. section 360bbb-3(b)(1), unless the authorization is terminated or revoked sooner.      Influenza A by PCR NEGATIVE NEGATIVE Final   Influenza B by PCR NEGATIVE NEGATIVE Final    Comment: (NOTE) The Xpert Xpress SARS-CoV-2/FLU/RSV assay is intended as an aid in  the diagnosis of influenza from Nasopharyngeal swab specimens and  should not be used as a sole basis for treatment. Nasal washings and  aspirates are unacceptable for Xpert Xpress SARS-CoV-2/FLU/RSV  testing.  Fact Sheet for Patients: [place]  Fact Sheet for Healthcare Providers: https://www.moore.com/  This test is not yet approved or cleared by the https://www.young.biz/ FDA and  has been authorized for detection and/or diagnosis of SARS-CoV-2 by  FDA under an Emergency Use Authorization (EUA). This EUA will remain  in effect (meaning this test can be used) for the duration of the  Covid-19 declaration under Section 564(b)(1) of the Act, 21  U.S.C. section 360bbb-3(b)(1), unless the authorization is  terminated or revoked. Performed at [facility], [address] [place], [place] [address]   Blood Culture (routine x 2)     Status: None (Preliminary result)   Collection Time: 03/25/20  1:10 PM   Specimen: BLOOD  Result Value Ref Range Status   Specimen Description   Final    BLOOD RIGHT ANTECUBITAL Performed at [facility], [address], [place], [place] [address]    Special Requests   Final    BOTTLES DRAWN AEROBIC AND ANAEROBIC Blood Culture adequate volume Performed at [facility], [address] [place], [place] [address]    Culture   Final    NO GROWTH < 24 HOURS Performed at [facility], [address] [place], [place] [address]    Report Status PENDING  Incomplete  Blood Culture (routine x 2)     Status: None (Preliminary result)   Collection Time: 03/25/20  1:10 PM   Specimen: BLOOD LEFT HAND  Result Value Ref Range Status   Specimen Description   Final    BLOOD LEFT HAND Performed at [facility], [address], [place], [place] [address]    Special Requests   Final    BOTTLES DRAWN AEROBIC AND ANAEROBIC Blood Culture adequate volume Performed at [facility], [address] [place], [place] [address]    Culture   Final    NO GROWTH < 24 HOURS Performed at [facility], [address] [place], [place] [address]    Report Status PENDING  Incomplete         Radiology Studies: DG Chest Port 1 View  Result Date: 03/25/2020 CLINICAL DATA:  Patient from infusion center, went to get antibody infusion this morning. Was  unable to get infusion as O2 87% on RA. EXAM: PORTABLE CHEST 1 VIEW COMPARISON:  Chest radiograph 03/23/2020 FINDINGS: Stable cardiomediastinal contours. Low lung volumes. There are persistent diffuse patchy predominantly peripheral opacities in the left greater than right lungs, similar to prior, consistent with multifocal pneumonia. No pneumothorax or large pleural effusion. No acute finding in the visualized skeleton. IMPRESSION: Diffuse bilateral opacities, predominantly peripheral, consistent with multifocal infection. Electronically Signed   By: Emmaline Kluver M.D.   On: 03/25/2020 13:01   ECHOCARDIOGRAM LIMITED  Result Date: 03/26/2020    ECHOCARDIOGRAM LIMITED REPORT   Patient Name:   Dennis Howe Date of Exam: 03/26/2020 Medical Rec #:  [id number]             Height:       71.0 in Accession #:    [id number]            Weight:       215.0 lb Date of Birth:  Dec 13, 1975             BSA:          2.174 m Patient Age:    44 years              BP:           147/89 mmHg Patient Gender: M                     HR:           107 bpm. Exam Location:  Inpatient Procedure: Limited Echo, Limited Color Doppler and Cardiac Doppler Indications:    Chest Pain 786.50 / R07.9  History:        Patient has no prior history of Echocardiogram examinations.  Sonographer:  Renella CunasJulia Swaim RDCS Referring Phys: [id number] JARED E SEGAL  Sonographer Comments: Covid positive. IMPRESSIONS  1. Left ventricular ejection fraction, by estimation, is 65 to 70%. The left ventricle has normal function. The left ventricle has no regional wall motion abnormalities. Left ventricular diastolic parameters were normal.  2. Right ventricular systolic function is normal. The right ventricular size is normal.  3. The mitral valve is normal in structure. No evidence of mitral valve regurgitation. No evidence of mitral stenosis.  4. The aortic valve is tricuspid.  5. Limited echo to evaluate LV and RV function FINDINGS  Left Ventricle: Left  ventricular ejection fraction, by estimation, is 65 to 70%. The left ventricle has normal function. The left ventricle has no regional wall motion abnormalities. The left ventricular internal cavity size was normal in size. Left ventricular diastolic parameters were normal. Right Ventricle: The right ventricular size is normal. No increase in right ventricular wall thickness. Right ventricular systolic function is normal. Pericardium: There is no evidence of pericardial effusion. Mitral Valve: The mitral valve is normal in structure. No evidence of mitral valve stenosis. Aortic Valve: The aortic valve is tricuspid. Pulmonic Valve: The pulmonic valve was not well visualized. Pulmonic valve regurgitation is not visualized. No evidence of pulmonic stenosis. Aorta: The aortic root is normal in size and structure. LEFT VENTRICLE PLAX 2D LVOT diam:     2.20 cm     Diastology LV SV:         78          LV e' medial:    6.20 cm/s LV SV Index:   36          LV E/e' medial:  10.2 LVOT Area:     3.80 cm    LV e' lateral:   10.90 cm/s                            LV E/e' lateral: 5.8  LV Volumes (MOD) LV vol d, MOD A2C: 97.8 ml LV vol d, MOD A4C: 90.0 ml LV vol s, MOD A2C: 42.1 ml LV vol s, MOD A4C: 39.3 ml LV SV MOD A2C:     55.7 ml LV SV MOD A4C:     90.0 ml LV SV MOD BP:      52.0 ml RIGHT VENTRICLE RV S prime:     17.00 cm/s TAPSE (M-mode): 1.8 cm AORTIC VALVE LVOT Vmax:   145.00 cm/s LVOT Vmean:  102.000 cm/s LVOT VTI:    0.205 m  AORTA Ao Root diam: 3.60 cm MITRAL VALVE MV Area (PHT): 2.45 cm    SHUNTS MV Decel Time: 310 msec    Systemic VTI:  0.20 m MV E velocity: 63.00 cm/s  Systemic Diam: 2.20 cm MV A velocity: 63.80 cm/s MV E/A ratio:  0.99 Dina RichJonathan Branch MD Electronically signed by Dina RichJonathan Branch MD Signature Date/Time: 03/26/2020/9:36:24 AM    Final         Scheduled Meds: . albuterol  2 puff Inhalation Q6H  . enoxaparin (LOVENOX) injection  40 mg Subcutaneous Q24H  . methylPREDNISolone (SOLU-MEDROL)  injection  0.5 mg/kg Intravenous Q12H   Followed by  . [START ON 03/28/2020] predniSONE  50 mg Oral Daily   Continuous Infusions: . remdesivir 100 mg in NS 100 mL Stopped (03/26/20 1157)     LOS: 1 day    Time spent: 45 minutes    Dorcas CarrowKuber Elayne Gruver, MD [facility] Pager [phone]

## 2020-03-26 NOTE — ED Notes (Signed)
Patient's wife provided with update.

## 2020-03-27 ENCOUNTER — Other Ambulatory Visit (HOSPITAL_COMMUNITY): Payer: Self-pay

## 2020-03-27 ENCOUNTER — Encounter (HOSPITAL_COMMUNITY): Payer: Self-pay | Admitting: Internal Medicine

## 2020-03-27 DIAGNOSIS — U071 COVID-19: Secondary | ICD-10-CM | POA: Diagnosis not present

## 2020-03-27 DIAGNOSIS — J9601 Acute respiratory failure with hypoxia: Secondary | ICD-10-CM | POA: Diagnosis not present

## 2020-03-27 LAB — COMPREHENSIVE METABOLIC PANEL
ALT: 178 U/L — ABNORMAL HIGH (ref 0–44)
AST: 73 U/L — ABNORMAL HIGH (ref 15–41)
Albumin: 3 g/dL — ABNORMAL LOW (ref 3.5–5.0)
Alkaline Phosphatase: 146 U/L — ABNORMAL HIGH (ref 38–126)
Anion gap: 11 (ref 5–15)
BUN: 23 mg/dL — ABNORMAL HIGH (ref 6–20)
CO2: 27 mmol/L (ref 22–32)
Calcium: 8.1 mg/dL — ABNORMAL LOW (ref 8.9–10.3)
Chloride: 97 mmol/L — ABNORMAL LOW (ref 98–111)
Creatinine, Ser: 0.74 mg/dL (ref 0.61–1.24)
GFR calc Af Amer: >60 mL/min (ref >60)
GFR calc non Af Amer: >60 mL/min (ref >60)
Glucose, Bld: 153 mg/dL — ABNORMAL HIGH (ref 70–99)
Potassium: 4.4 mmol/L (ref 3.5–5.1)
Sodium: 135 mmol/L (ref 135–145)
Total Bilirubin: 0.7 mg/dL (ref 0.3–1.2)
Total Protein: 7.4 g/dL (ref 6.5–8.1)

## 2020-03-27 LAB — CBC WITH DIFFERENTIAL/PLATELET
Abs Immature Granulocytes: 0 10*3/uL (ref 0.00–0.07)
Band Neutrophils: 23 %
Basophils Absolute: 0 10*3/uL (ref 0.0–0.1)
Basophils Relative: 0 %
Eosinophils Absolute: 0 10*3/uL (ref 0.0–0.5)
Eosinophils Relative: 0 %
HCT: 42.7 % (ref 39.0–52.0)
Hemoglobin: 14.6 g/dL (ref 13.0–17.0)
Lymphocytes Relative: 8 %
Lymphs Abs: 0.9 10*3/uL (ref 0.7–4.0)
MCH: 30 pg (ref 26.0–34.0)
MCHC: 34.2 g/dL (ref 30.0–36.0)
MCV: 87.7 fL (ref 80.0–100.0)
Monocytes Absolute: 0.2 10*3/uL (ref 0.1–1.0)
Monocytes Relative: 2 %
Neutro Abs: 9.8 10*3/uL — ABNORMAL HIGH (ref 1.7–7.7)
Neutrophils Relative %: 67 %
Platelets: 270 10*3/uL (ref 150–400)
RBC: 4.87 MIL/uL (ref 4.22–5.81)
RDW: 12.4 % (ref 11.5–15.5)
WBC Morphology: INCREASED
WBC: 10.9 10*3/uL — ABNORMAL HIGH (ref 4.0–10.5)
nRBC: 0 % (ref 0.0–0.2)

## 2020-03-27 LAB — C-REACTIVE PROTEIN: CRP: 20.1 mg/dL — ABNORMAL HIGH (ref <1.0)

## 2020-03-27 LAB — FERRITIN: Ferritin: 2430 ng/mL — ABNORMAL HIGH (ref 24–336)

## 2020-03-27 LAB — D-DIMER, QUANTITATIVE: D-Dimer, Quant: 0.45 ug/mL-FEU (ref 0.00–0.50)

## 2020-03-27 MED ORDER — ALBUTEROL SULFATE HFA 108 (90 BASE) MCG/ACT IN AERS
2.0000 | INHALATION_SPRAY | Freq: Three times a day (TID) | RESPIRATORY_TRACT | Status: DC
  Administered 2020-03-27 – 2020-03-29 (×9): 2 via RESPIRATORY_TRACT
  Filled 2020-03-27 (×2): qty 6.7

## 2020-03-27 NOTE — Progress Notes (Signed)
Initially at start of shift pt sleeping o2 sats 88-90% on NRB and 11 L HFNC. Writer called in room at 2030 pt desatted to 80-82%. Increased HFNC from 11 to 15 L. And repositioned pt to R side. Took approx 20-25 min before  O2 saturation reached 88-92%. Pt up to chair from 2100-2300 and desatted only when using urinal to 80-82%. Will continue to monitor

## 2020-03-27 NOTE — Progress Notes (Signed)
PROGRESS NOTE    Dennis Howe  YYQ:[id number] [name]: 02/01/1976 DOA: 03/25/2020 PCP: Pcp, No    Brief Narrative:  44 year old gentleman with no significant medical history, not vaccinated against COVID-19 presented to the ER with intermittent substernal chest pain and shortness of breath worsening for 1 day, diagnosed with COVID-19 on 9/28 at outside facility.  He was also seen in the emergency room on 9/30 for nausea and vomiting and discharged home on conservative management.  Continue to have worsening symptoms so came back to the ER. In the emergency room, afebrile, tachypneic and tachycardic.  Initially requiring 2 L of oxygen.  Chest x-ray with bilateral consolidation.  Admitted with steroids and remdesivir. Patient initially admitted on 2 L oxygen, his oxygen requirement increased and currently on 15 L.   Assessment & Plan:   Principal Problem:   Acute hypoxemic respiratory failure due to COVID-19 [facility]) Active Problems:   Chest pain   Elevated LFTs  Acute hypoxemic respiratory failure due to COVID-19 virus pneumonia: Continue to monitor due to significant symptoms, requiring very high level of oxygen. chest physiotherapy, incentive spirometry, deep breathing exercises, sputum induction, mucolytic's and bronchodilators. Supplemental oxygen to keep saturations more than 85 %. Covid directed therapy with , steroids, high-dose Solu-Medrol remdesivir, day 3/5 Actemra, 800 mg received on 10/3 with patient's consent. Due to severity of symptoms, patient will need daily inflammatory markers, chest x-rays, liver function test to monitor and direct COVID-19 therapies. Mobility.  Prone positioning.  Start mobilizing.  COVID-19 Labs  Recent Labs    03/25/20 1310 03/26/20 0406 03/26/20 0436 03/27/20 0341  DDIMER 0.32 0.38  --  0.45  FERRITIN 1,808*  --  1,492* 2,430*  LDH 502*  --   --   --   CRP 16.8*  --  16.5* 20.1*    Lab Results  Component Value Date    SARSCOV2NAA POSITIVE (A) 03/25/2020   SpO2: (!) 85 % O2 Flow Rate (L/min): 15 L/min  Abnormal liver function test: Consistent with viral infection.  Will need close monitoring. Hepatic Function Latest Ref Rng & Units 03/27/2020 03/26/2020 03/25/2020  Total Protein 6.5 - 8.1 g/dL 7.4 7.6 9.1(H)  Albumin 3.5 - 5.0 g/dL 3.0(L) 3.1(L) 4.1  AST 15 - 41 U/L 73(H) 103(H) 141(H)  ALT 0 - 44 U/L 178(H) 167(H) 190(H)  Alk Phosphatase 38 - 126 U/L 146(H) 178(H) 188(H)  Total Bilirubin 0.3 - 1.2 mg/dL 0.7 0.8 1.0    DVT prophylaxis: enoxaparin (LOVENOX) injection 40 mg Start: 03/25/20 2200 SCDs Start: 03/25/20 1721   Code Status: Full code Family Communication: Wife on the phone. Disposition Plan: Status is: Inpatient  Remains inpatient appropriate because:Inpatient level of care appropriate due to severity of illness   Dispo: The patient is from: Home              Anticipated d/c is to: Home              Anticipated d/c date is: > 3 days              Patient currently is not medically stable to d/c.         Consultants:   None  Procedures:   None  Antimicrobials:  Anti-infectives (From admission, onward)   Start     Dose/Rate Route Frequency Ordered Stop   03/26/20 1000  remdesivir 100 mg in sodium chloride 0.9 % 100 mL IVPB       "Followed by" Linked Group Details  100 mg 200 mL/hr over 30 Minutes Intravenous Daily 03/25/20 1249 03/30/20 0959   03/26/20 1000  remdesivir 100 mg in sodium chloride 0.9 % 100 mL IVPB  Status:  Discontinued       "Followed by" Linked Group Details   100 mg 200 mL/hr over 30 Minutes Intravenous Daily 03/25/20 1720 03/25/20 1730   03/25/20 1720  remdesivir 200 mg in sodium chloride 0.9% 250 mL IVPB  Status:  Discontinued       "Followed by" Linked Group Details   200 mg 580 mL/hr over 30 Minutes Intravenous Once 03/25/20 1720 03/25/20 1730   03/25/20 1500  remdesivir 200 mg in sodium chloride 0.9% 250 mL IVPB       "Followed by" Linked Group  Details   200 mg 580 mL/hr over 30 Minutes Intravenous Once 03/25/20 1249 03/25/20 1702         Subjective: Patient seen and examined.  No overnight events.  Patient is on high flow nasal cannula oxygen, however he feels okay. Objective: Vitals:   03/27/20 0500 03/27/20 0531 03/27/20 0640 03/27/20 1037  BP: 115/77 130/67 129/86 109/68  Pulse: 66 71 67 77  Resp: (!) 33 20 (!) 21 20  Temp:   97.9 F (36.6 C) 97.6 F (36.4 C)  TempSrc:    Oral  SpO2: (!) 88% 91% (!) 83% (!) 85%  Weight:      Height:       No intake or output data in the 24 hours ending 03/27/20 1439 Filed Weights   03/25/20 1328  Weight: 97.5 kg    Examination:  General exam: Appears calm and comfortable.  With mild respiratory stress on conversation. Respiratory system: No added sound.  Respiratory effort normal.  Poor bilateral air entry. Cardiovascular system: S1 & S2 heard, RRR. No JVD, murmurs, rubs, gallops or clicks. No pedal edema. Gastrointestinal system: Abdomen is nondistended, soft and nontender. No organomegaly or masses felt. Normal bowel sounds heard. Central nervous system: Alert and oriented. No focal neurological deficits. Extremities: Symmetric 5 x 5 power. Skin: No rashes, lesions or ulcers Psychiatry: Judgement and insight appear normal. Mood & affect appropriate.     Data Reviewed: I have personally reviewed following labs and imaging studies  CBC: Recent Labs  Lab 03/23/20 1335 03/25/20 1310 03/26/20 0406 03/27/20 0341  WBC 4.3 5.3 8.1 10.9*  NEUTROABS  --  4.0 7.1 9.8*  HGB 14.9 16.5 14.5 14.6  HCT 44.7 48.2 42.1 42.7  MCV 88.2 87.3 86.6 87.7  PLT 152 213 214 401   Basic Metabolic Panel: Recent Labs  Lab 03/23/20 1335 03/25/20 1310 03/26/20 0406 03/27/20 0341  NA 132* 134* 132* 135  K 4.1 4.6 4.1 4.4  CL 96* 94* 96* 97*  CO2 _0 GLUCOSE 125* 102* 153* 153*  BUN 21* 16 19 23*  CREATININE 0.94 0.87 0.77 0.74  CALCIUM 8.6* 8.9 8.1* 8.1*    GFR: Estimated Creatinine Clearance: 143.3 mL/min (by C-G formula based on SCr of 0.74 mg/dL). Liver Function Tests: Recent Labs  Lab 03/23/20 1335 03/25/20 1310 03/26/20 0406 03/27/20 0341  AST 61* 141* 103* 73*  ALT 83* 190* 167* 178*  ALKPHOS 90 188* 178* 146*  BILITOT 0.6 1.0 0.8 0.7  PROT 7.4 9.1* 7.6 7.4  ALBUMIN 3.5 4.1 3.1* 3.0*   No results for input(s): LIPASE, AMYLASE in the last 168 hours. No results for input(s): AMMONIA in the last 168 hours. Coagulation Profile: No results for input(s): INR,  PROTIME in the last 168 hours. Cardiac Enzymes: No results for input(s): CKTOTAL, CKMB, CKMBINDEX, TROPONINI in the last 168 hours. BNP (last 3 results) No results for input(s): PROBNP in the last 8760 hours. HbA1C: No results for input(s): HGBA1C in the last 72 hours. CBG: No results for input(s): GLUCAP in the last 168 hours. Lipid Profile: Recent Labs    03/25/20 1310  TRIG 160*   Thyroid Function Tests: No results for input(s): TSH, T4TOTAL, FREET4, T3FREE, THYROIDAB in the last 72 hours. Anemia Panel: Recent Labs    03/26/20 0436 03/27/20 0341  FERRITIN 1,492* 2,430*   Sepsis Labs: Recent Labs  Lab 03/25/20 1310  PROCALCITON 0.10  LATICACIDVEN 1.2    Recent Results (from the past 240 hour(s))  Respiratory Panel by RT PCR (Flu A&B, Covid) - Nasopharyngeal Swab     Status: Abnormal   Collection Time: 03/25/20  1:10 PM   Specimen: Nasopharyngeal Swab  Result Value Ref Range Status   SARS Coronavirus 2 by RT PCR POSITIVE (A) NEGATIVE Final    Comment: RESULT CALLED TO, READ BACK BY AND VERIFIED WITH: M.QUICK,RN [id number] _0  BY V.WILKINS (NOTE) SARS-CoV-2 target nucleic acids are DETECTED.  SARS-CoV-2 RNA is generally detectable in upper respiratory specimens  during the acute phase of infection. Positive results are indicative of the presence of the identified virus, but do not rule out bacterial infection or co-infection with other pathogens  not detected by the test. Clinical correlation with patient history and other diagnostic information is necessary to determine patient infection status. The expected result is Negative.  Fact Sheet for Patients:  PinkCheek.be  Fact Sheet for Healthcare Providers: GravelBags.it  This test is not yet approved or cleared by the [place] FDA and  has been authorized for detection and/or diagnosis of SARS-CoV-2 by FDA under an Emergency Use Authorization (EUA).  This EUA will remain in effect (meaning this test can b e used) for the duration of  the COVID-19 declaration under Section 564(b)(1) of the Act, 21 U.S.C. section 360bbb-3(b)(1), unless the authorization is terminated or revoked sooner.      Influenza A by PCR NEGATIVE NEGATIVE Final   Influenza B by PCR NEGATIVE NEGATIVE Final    Comment: (NOTE) The Xpert Xpress SARS-CoV-2/FLU/RSV assay is intended as an aid in  the diagnosis of influenza from Nasopharyngeal swab specimens and  should not be used as a sole basis for treatment. Nasal washings and  aspirates are unacceptable for Xpert Xpress SARS-CoV-2/FLU/RSV  testing.  Fact Sheet for Patients: PinkCheek.be  Fact Sheet for Healthcare Providers: GravelBags.it  This test is not yet approved or cleared by the [place] FDA and  has been authorized for detection and/or diagnosis of SARS-CoV-2 by  FDA under an Emergency Use Authorization (EUA). This EUA will remain  in effect (meaning this test can be used) for the duration of the  Covid-19 declaration under Section 564(b)(1) of the Act, 21  U.S.C. section 360bbb-3(b)(1), unless the authorization is  terminated or revoked. Performed at [facility], [place] [address], [place], [place] [id number]   Blood Culture (routine x 2)     Status: None (Preliminary result)   Collection Time:  03/25/20  1:10 PM   Specimen: BLOOD  Result Value Ref Range Status   Specimen Description   Final    BLOOD RIGHT ANTECUBITAL Performed at [facility] [address], [place], [place] [address]    Special Requests   Final    BOTTLES  DRAWN AEROBIC AND ANAEROBIC Blood Culture adequate volume Performed at [facility] [address], [place], [place] [address]    Culture   Final    NO GROWTH 2 DAYS Performed at [facility] [address] [place], [place] [address]    Report Status PENDING  Incomplete  Blood Culture (routine x 2)     Status: None (Preliminary result)   Collection Time: 03/25/20  1:10 PM   Specimen: BLOOD LEFT HAND  Result Value Ref Range Status   Specimen Description   Final    BLOOD LEFT HAND Performed at [facility], [place] [address] [place], [place] [address]    Special Requests   Final    BOTTLES DRAWN AEROBIC AND ANAEROBIC Blood Culture adequate volume Performed at [facility] [address] [place], [place] [address]    Culture   Final    NO GROWTH 2 DAYS Performed at [facility] [address] [place], [place] [address]    Report Status PENDING  Incomplete         Radiology Studies: ECHOCARDIOGRAM LIMITED  Result Date: 03/26/2020    ECHOCARDIOGRAM LIMITED REPORT   Patient Name:   TYRONNE BLANN Date of Exam: 03/26/2020 Medical Rec #:  [id number]             Height:       71.0 in Accession #:    [id number]            Weight:       215.0 lb Date of Birth:  08/21/1975             BSA:          2.174 m Patient Age:    53 years              BP:           147/89 mmHg Patient Gender: M                     HR:           107 bpm. Exam Location:  Inpatient Procedure: Limited Echo, Limited Color Doppler and Cardiac Doppler Indications:    Chest Pain 786.50 / R07.9  History:        Patient has no prior history of Echocardiogram examinations.  Sonographer:    Vickie Epley RDCS  Referring Phys: [id number] Harold Hedge  Sonographer Comments: Covid positive. IMPRESSIONS  1. Left ventricular ejection fraction, by estimation, is 65 to 70%. The left ventricle has normal function. The left ventricle has no regional wall motion abnormalities. Left ventricular diastolic parameters were normal.  2. Right ventricular systolic function is normal. The right ventricular size is normal.  3. The mitral valve is normal in structure. No evidence of mitral valve regurgitation. No evidence of mitral stenosis.  4. The aortic valve is tricuspid.  5. Limited echo to evaluate LV and RV function FINDINGS  Left Ventricle: Left ventricular ejection fraction, by estimation, is 65 to 70%. The left ventricle has normal function. The left ventricle has no regional wall motion abnormalities. The left ventricular internal cavity size was normal in size. Left ventricular diastolic parameters were normal. Right Ventricle: The right ventricular size is normal. No increase in right ventricular wall thickness. Right ventricular systolic function is normal. Pericardium: There is no evidence of pericardial effusion. Mitral Valve: The mitral valve is normal in structure. No evidence of  mitral valve stenosis. Aortic Valve: The aortic valve is tricuspid. Pulmonic Valve: The pulmonic valve was not well visualized. Pulmonic valve regurgitation is not visualized. No evidence of pulmonic stenosis. Aorta: The aortic root is normal in size and structure. LEFT VENTRICLE PLAX 2D LVOT diam:     2.20 cm     Diastology LV SV:         78          LV e' medial:    6.20 cm/s LV SV Index:   36          LV E/e' medial:  10.2 LVOT Area:     3.80 cm    LV e' lateral:   10.90 cm/s                            LV E/e' lateral: 5.8  LV Volumes (MOD) LV vol d, MOD A2C: 97.8 ml LV vol d, MOD A4C: 90.0 ml LV vol s, MOD A2C: 42.1 ml LV vol s, MOD A4C: 39.3 ml LV SV MOD A2C:     55.7 ml LV SV MOD A4C:     90.0 ml LV SV MOD BP:      52.0 ml RIGHT VENTRICLE RV S  prime:     17.00 cm/s TAPSE (M-mode): 1.8 cm AORTIC VALVE LVOT Vmax:   145.00 cm/s LVOT Vmean:  102.000 cm/s LVOT VTI:    0.205 m  AORTA Ao Root diam: 3.60 cm MITRAL VALVE MV Area (PHT): 2.45 cm    SHUNTS MV Decel Time: 310 msec    Systemic VTI:  0.20 m MV E velocity: 63.00 cm/s  Systemic Diam: 2.20 cm MV A velocity: 63.80 cm/s MV E/A ratio:  0.99 Carlyle Dolly MD Electronically signed by Carlyle Dolly MD Signature Date/Time: 03/26/2020/9:36:24 AM    Final         Scheduled Meds: . albuterol  2 puff Inhalation TID  . enoxaparin (LOVENOX) injection  40 mg Subcutaneous Q24H  . methylPREDNISolone (SOLU-MEDROL) injection  0.5 mg/kg Intravenous Q12H   Followed by  . [START ON 03/28/2020] predniSONE  50 mg Oral Daily   Continuous Infusions: . remdesivir 100 mg in NS 100 mL 100 mg (03/27/20 1025)     LOS: 2 days    Time spent: 35 minutes    Barb Merino, MD [facility] Pager [phone]

## 2020-03-27 NOTE — Plan of Care (Signed)

## 2020-03-28 DIAGNOSIS — J9601 Acute respiratory failure with hypoxia: Secondary | ICD-10-CM | POA: Diagnosis not present

## 2020-03-28 DIAGNOSIS — U071 COVID-19: Secondary | ICD-10-CM | POA: Diagnosis not present

## 2020-03-28 LAB — CBC WITH DIFFERENTIAL/PLATELET
Abs Immature Granulocytes: 0.06 10*3/uL (ref 0.00–0.07)
Basophils Absolute: 0 10*3/uL (ref 0.0–0.1)
Basophils Relative: 0 %
Eosinophils Absolute: 0 10*3/uL (ref 0.0–0.5)
Eosinophils Relative: 0 %
HCT: 44.9 % (ref 39.0–52.0)
Hemoglobin: 15.5 g/dL (ref 13.0–17.0)
Immature Granulocytes: 1 %
Lymphocytes Relative: 6 %
Lymphs Abs: 0.7 10*3/uL (ref 0.7–4.0)
MCH: 30.4 pg (ref 26.0–34.0)
MCHC: 34.5 g/dL (ref 30.0–36.0)
MCV: 88 fL (ref 80.0–100.0)
Monocytes Absolute: 0.3 10*3/uL (ref 0.1–1.0)
Monocytes Relative: 2 %
Neutro Abs: 10.4 10*3/uL — ABNORMAL HIGH (ref 1.7–7.7)
Neutrophils Relative %: 91 %
Platelets: 307 10*3/uL (ref 150–400)
RBC: 5.1 MIL/uL (ref 4.22–5.81)
RDW: 12.2 % (ref 11.5–15.5)
WBC: 11.5 10*3/uL — ABNORMAL HIGH (ref 4.0–10.5)
nRBC: 0 % (ref 0.0–0.2)

## 2020-03-28 LAB — COMPREHENSIVE METABOLIC PANEL
ALT: 130 U/L — ABNORMAL HIGH (ref 0–44)
AST: 41 U/L (ref 15–41)
Albumin: 3 g/dL — ABNORMAL LOW (ref 3.5–5.0)
Alkaline Phosphatase: 129 U/L — ABNORMAL HIGH (ref 38–126)
Anion gap: 12 (ref 5–15)
BUN: 28 mg/dL — ABNORMAL HIGH (ref 6–20)
CO2: 24 mmol/L (ref 22–32)
Calcium: 8 mg/dL — ABNORMAL LOW (ref 8.9–10.3)
Chloride: 101 mmol/L (ref 98–111)
Creatinine, Ser: 0.79 mg/dL (ref 0.61–1.24)
GFR calc Af Amer: >60 mL/min (ref >60)
GFR calc non Af Amer: >60 mL/min (ref >60)
Glucose, Bld: 169 mg/dL — ABNORMAL HIGH (ref 70–99)
Potassium: 4.6 mmol/L (ref 3.5–5.1)
Sodium: 137 mmol/L (ref 135–145)
Total Bilirubin: 0.7 mg/dL (ref 0.3–1.2)
Total Protein: 6.8 g/dL (ref 6.5–8.1)

## 2020-03-28 LAB — FERRITIN: Ferritin: 1706 ng/mL — ABNORMAL HIGH (ref 24–336)

## 2020-03-28 LAB — C-REACTIVE PROTEIN: CRP: 7.3 mg/dL — ABNORMAL HIGH (ref <1.0)

## 2020-03-28 LAB — D-DIMER, QUANTITATIVE: D-Dimer, Quant: 0.62 ug/mL-FEU — ABNORMAL HIGH (ref 0.00–0.50)

## 2020-03-28 MED ORDER — METHYLPREDNISOLONE SODIUM SUCC 125 MG IJ SOLR
60.0000 mg | Freq: Two times a day (BID) | INTRAMUSCULAR | Status: DC
  Administered 2020-03-28 – 2020-03-30 (×5): 60 mg via INTRAVENOUS
  Filled 2020-03-28 (×5): qty 2

## 2020-03-28 MED ORDER — MELATONIN 3 MG PO TABS
6.0000 mg | ORAL_TABLET | Freq: Every evening | ORAL | Status: DC | PRN
  Administered 2020-03-28 – 2020-03-31 (×3): 6 mg via ORAL
  Filled 2020-03-28 (×3): qty 2

## 2020-03-28 NOTE — Progress Notes (Signed)
PROGRESS NOTE    Dennis Howe  TSV:[id number] [name]: 04-Nov-1975 DOA: 03/25/2020 PCP: Pcp, No    Brief Narrative:  44 year old gentleman with no significant medical history, not vaccinated against COVID-19 presented to the ER with intermittent substernal chest pain and shortness of breath worsening for 1 day, diagnosed with COVID-19 on 9/28 at outside facility.  He was also seen in the emergency room on 9/30 for nausea and vomiting and discharged home on conservative management.  Continue to have worsening symptoms so came back to the ER. In the emergency room, afebrile, tachypneic and tachycardic.  Initially requiring 2 L of oxygen.  Chest x-ray with bilateral consolidation.  Admitted with steroids and remdesivir. Patient initially admitted on 2 L oxygen, his oxygen requirement increased and currently on 15 L.   Assessment & Plan:   Principal Problem:   Acute hypoxemic respiratory failure due to COVID-19 [facility]) Active Problems:   Chest pain   Elevated LFTs  Acute hypoxemic respiratory failure due to COVID-19 virus pneumonia: Continue to monitor due to significant symptoms, requiring very high level of oxygen. chest physiotherapy, incentive spirometry, deep breathing exercises, sputum induction, mucolytic's and bronchodilators. Supplemental oxygen to keep saturations more than 85 %. Covid directed therapy with , steroids, high-dose Solu-Medrol remdesivir, day 4/5 Actemra, 800 mg received on 10/3 with patient's consent. Due to severity of symptoms, patient will need daily inflammatory markers, chest x-rays, liver function test to monitor and direct COVID-19 therapies. Mobility.  Prone positioning.  Start mobilizing.  COVID-19 Labs  Recent Labs    03/26/20 0406 03/26/20 0436 03/27/20 0341 03/28/20 0522  DDIMER 0.38  --  0.45 0.62*  FERRITIN  --  1,492* 2,430* 1,706*  CRP  --  16.5* 20.1* 7.3*    Lab Results  Component Value Date   SARSCOV2NAA POSITIVE (A) 03/25/2020    SpO2: 91 % O2 Flow Rate (L/min): 15 L/min  Abnormal liver function test: Consistent with viral infection.  Will need close monitoring.  Stabilizing. Hepatic Function Latest Ref Rng & Units 03/28/2020 03/27/2020 03/26/2020  Total Protein 6.5 - 8.1 g/dL 6.8 7.4 7.6  Albumin 3.5 - 5.0 g/dL 3.0(L) 3.0(L) 3.1(L)  AST 15 - 41 U/L 41 73(H) 103(H)  ALT 0 - 44 U/L 130(H) 178(H) 167(H)  Alk Phosphatase 38 - 126 U/L 129(H) 146(H) 178(H)  Total Bilirubin 0.3 - 1.2 mg/dL 0.7 0.7 0.8    DVT prophylaxis: enoxaparin (LOVENOX) injection 40 mg Start: 03/25/20 2200 SCDs Start: 03/25/20 1721   Code Status: Full code Family Communication: Wife on the phone. Disposition Plan: Status is: Inpatient  Remains inpatient appropriate because:Inpatient level of care appropriate due to severity of illness   Dispo: The patient is from: Home              Anticipated d/c is to: Home              Anticipated d/c date is: > 3 days              Patient currently is not medically stable to d/c.         Consultants:   None  Procedures:   None  Antimicrobials:  Anti-infectives (From admission, onward)   Start     Dose/Rate Route Frequency Ordered Stop   03/26/20 1000  remdesivir 100 mg in sodium chloride 0.9 % 100 mL IVPB       "Followed by" Linked Group Details   100 mg 200 mL/hr over 30 Minutes Intravenous Daily 03/25/20 1249  03/30/20 0959   03/26/20 1000  remdesivir 100 mg in sodium chloride 0.9 % 100 mL IVPB  Status:  Discontinued       "Followed by" Linked Group Details   100 mg 200 mL/hr over 30 Minutes Intravenous Daily 03/25/20 1720 03/25/20 1730   03/25/20 1720  remdesivir 200 mg in sodium chloride 0.9% 250 mL IVPB  Status:  Discontinued       "Followed by" Linked Group Details   200 mg 580 mL/hr over 30 Minutes Intravenous Once 03/25/20 1720 03/25/20 1730   03/25/20 1500  remdesivir 200 mg in sodium chloride 0.9% 250 mL IVPB       "Followed by" Linked Group Details   200 mg 580 mL/hr  over 30 Minutes Intravenous Once 03/25/20 1249 03/25/20 1702         Subjective: Patient seen and examined.  At rest he feels okay.  Overnight he had intermittent hypoxemia and difficult to tolerate mobility. Objective: Vitals:   03/27/20 2048 03/27/20 2100 03/28/20 0211 03/28/20 1332  BP: 120/86  129/88 122/80  Pulse: 65  71 68  Resp: (!) 22 (!) 33 (!) 24 18  Temp: 99 F (37.2 C)  98.6 F (37 C) 97.8 F (36.6 C)  TempSrc:    Oral  SpO2: 90%  (!) 89% 91%  Weight:      Height:        Intake/Output Summary (Last 24 hours) at 03/28/2020 1342 Last data filed at 03/28/2020 0610 Gross per 24 hour  Intake --  Output 2000 ml  Net -2000 ml   Filed Weights   03/25/20 1328  Weight: 97.5 kg    Examination:  General exam: Sick looking.  In mild respiratory distress after mobility.  On 15 L nonrebreather. Respiratory system: No added sound.  Respiratory effort normal.  Poor bilateral air entry. Cardiovascular system: S1 & S2 heard, RRR. No JVD, murmurs, rubs, gallops or clicks. No pedal edema. Gastrointestinal system: Abdomen is nondistended, soft and nontender. No organomegaly or masses felt. Normal bowel sounds heard. Central nervous system: Alert and oriented. No focal neurological deficits. Extremities: Symmetric 5 x 5 power. Skin: No rashes, lesions or ulcers Psychiatry: Judgement and insight appear normal. Mood & affect anxious.    Data Reviewed: I have personally reviewed following labs and imaging studies  CBC: Recent Labs  Lab 03/23/20 1335 03/25/20 1310 03/26/20 0406 03/27/20 0341 03/28/20 0522  WBC 4.3 5.3 8.1 10.9* 11.5*  NEUTROABS  --  4.0 7.1 9.8* 10.4*  HGB 14.9 16.5 14.5 14.6 15.5  HCT 44.7 48.2 42.1 42.7 44.9  MCV 88.2 87.3 86.6 87.7 88.0  PLT 152 213 214 270 633   Basic Metabolic Panel: Recent Labs  Lab 03/23/20 1335 03/25/20 1310 03/26/20 0406 03/27/20 0341 03/28/20 0522  NA 132* 134* 132* 135 137  K 4.1 4.6 4.1 4.4 4.6  CL 96* 94* 96*  97* 101  CO2 $Re'23 25 23 27 24  'Wbe$ GLUCOSE 125* 102* 153* 153* 169*  BUN 21* 16 19 23* 28*  CREATININE 0.94 0.87 0.77 0.74 0.79  CALCIUM 8.6* 8.9 8.1* 8.1* 8.0*   GFR: Estimated Creatinine Clearance: 143.3 mL/min (by C-G formula based on SCr of 0.79 mg/dL). Liver Function Tests: Recent Labs  Lab 03/23/20 1335 03/25/20 1310 03/26/20 0406 03/27/20 0341 03/28/20 0522  AST 61* 141* 103* 73* 41  ALT 83* 190* 167* 178* 130*  ALKPHOS 90 188* 178* 146* 129*  BILITOT 0.6 1.0 0.8 0.7 0.7  PROT 7.4 9.1* 7.6  7.4 6.8  ALBUMIN 3.5 4.1 3.1* 3.0* 3.0*   No results for input(s): LIPASE, AMYLASE in the last 168 hours. No results for input(s): AMMONIA in the last 168 hours. Coagulation Profile: No results for input(s): INR, PROTIME in the last 168 hours. Cardiac Enzymes: No results for input(s): CKTOTAL, CKMB, CKMBINDEX, TROPONINI in the last 168 hours. BNP (last 3 results) No results for input(s): PROBNP in the last 8760 hours. HbA1C: No results for input(s): HGBA1C in the last 72 hours. CBG: No results for input(s): GLUCAP in the last 168 hours. Lipid Profile: No results for input(s): CHOL, HDL, LDLCALC, TRIG, CHOLHDL, LDLDIRECT in the last 72 hours. Thyroid Function Tests: No results for input(s): TSH, T4TOTAL, FREET4, T3FREE, THYROIDAB in the last 72 hours. Anemia Panel: Recent Labs    03/27/20 0341 03/28/20 0522  FERRITIN 2,430* 1,706*   Sepsis Labs: Recent Labs  Lab 03/25/20 1310  PROCALCITON 0.10  LATICACIDVEN 1.2    Recent Results (from the past 240 hour(s))  Respiratory Panel by RT PCR (Flu A&B, Covid) - Nasopharyngeal Swab     Status: Abnormal   Collection Time: 03/25/20  1:10 PM   Specimen: Nasopharyngeal Swab  Result Value Ref Range Status   SARS Coronavirus 2 by RT PCR POSITIVE (A) NEGATIVE Final    Comment: RESULT CALLED TO, READ BACK BY AND VERIFIED WITH: M.QUICK,RN [id number] $[email or web address]'@[id number]'[id number]$  BY V.WILKINS (NOTE) SARS-CoV-2 target nucleic acids are DETECTED.  SARS-CoV-2 RNA  is generally detectable in upper respiratory specimens  during the acute phase of infection. Positive results are indicative of the presence of the identified virus, but do not rule out bacterial infection or co-infection with other pathogens not detected by the test. Clinical correlation with patient history and other diagnostic information is necessary to determine patient infection status. The expected result is Negative.  Fact Sheet for Patients:  PinkCheek.be  Fact Sheet for Healthcare Providers: GravelBags.it  This test is not yet approved or cleared by the [place] FDA and  has been authorized for detection and/or diagnosis of SARS-CoV-2 by FDA under an Emergency Use Authorization (EUA).  This EUA will remain in effect (meaning this test can b e used) for the duration of  the COVID-19 declaration under Section 564(b)(1) of the Act, 21 U.S.C. section 360bbb-3(b)(1), unless the authorization is terminated or revoked sooner.      Influenza A by PCR NEGATIVE NEGATIVE Final   Influenza B by PCR NEGATIVE NEGATIVE Final    Comment: (NOTE) The Xpert Xpress SARS-CoV-2/FLU/RSV assay is intended as an aid in  the diagnosis of influenza from Nasopharyngeal swab specimens and  should not be used as a sole basis for treatment. Nasal washings and  aspirates are unacceptable for Xpert Xpress SARS-CoV-2/FLU/RSV  testing.  Fact Sheet for Patients: PinkCheek.be  Fact Sheet for Healthcare Providers: GravelBags.it  This test is not yet approved or cleared by the [place] FDA and  has been authorized for detection and/or diagnosis of SARS-CoV-2 by  FDA under an Emergency Use Authorization (EUA). This EUA will remain  in effect (meaning this test can be used) for the duration of the  Covid-19 declaration under Section 564(b)(1) of the Act, 21  U.S.C. section  360bbb-3(b)(1), unless the authorization is  terminated or revoked. Performed at [facility], [place] [address], [place], [place] [address]   Blood Culture (routine x 2)     Status: None (Preliminary result)   Collection Time: 03/25/20  1:10 PM   Specimen: BLOOD  Result  Value Ref Range Status   Specimen Description   Final    BLOOD RIGHT ANTECUBITAL Performed at [place] [address], [place], [place] [address]    Special Requests   Final    BOTTLES DRAWN AEROBIC AND ANAEROBIC Blood Culture adequate volume Performed at [place] [address], [place], [place] [address]    Culture   Final    NO GROWTH 3 DAYS Performed at [facility], [place] [address], [place], [place] [address]    Report Status PENDING  Incomplete  Blood Culture (routine x 2)     Status: None (Preliminary result)   Collection Time: 03/25/20  1:10 PM   Specimen: BLOOD LEFT HAND  Result Value Ref Range Status   Specimen Description   Final    BLOOD LEFT HAND Performed at [facility], [place] [address], [place], [place] [address]    Special Requests   Final    BOTTLES DRAWN AEROBIC AND ANAEROBIC Blood Culture adequate volume Performed at [place] [address] [place], [place] [address]    Culture   Final    NO GROWTH 3 DAYS Performed at [facility], [place] [address] [place], [place] [address]    Report Status PENDING  Incomplete         Radiology Studies: No results found.      Scheduled Meds: . albuterol  2 puff Inhalation TID  . enoxaparin (LOVENOX) injection  40 mg Subcutaneous Q24H  . methylPREDNISolone (SOLU-MEDROL) injection  60 mg Intravenous Q12H   Continuous Infusions: . remdesivir 100 mg in NS 100 mL 100 mg (03/28/20 0933)     LOS: 3 days    Time spent: 35 minutes    Barb Merino, MD [facility] Pager [phone]

## 2020-03-29 ENCOUNTER — Inpatient Hospital Stay (HOSPITAL_COMMUNITY): Payer: 59

## 2020-03-29 DIAGNOSIS — U071 COVID-19: Secondary | ICD-10-CM | POA: Diagnosis not present

## 2020-03-29 DIAGNOSIS — J9601 Acute respiratory failure with hypoxia: Secondary | ICD-10-CM | POA: Diagnosis not present

## 2020-03-29 LAB — COMPREHENSIVE METABOLIC PANEL
ALT: 95 U/L — ABNORMAL HIGH (ref 0–44)
AST: 28 U/L (ref 15–41)
Albumin: 3 g/dL — ABNORMAL LOW (ref 3.5–5.0)
Alkaline Phosphatase: 109 U/L (ref 38–126)
Anion gap: 10 (ref 5–15)
BUN: 26 mg/dL — ABNORMAL HIGH (ref 6–20)
CO2: 21 mmol/L — ABNORMAL LOW (ref 22–32)
Calcium: 8 mg/dL — ABNORMAL LOW (ref 8.9–10.3)
Chloride: 103 mmol/L (ref 98–111)
Creatinine, Ser: 0.68 mg/dL (ref 0.61–1.24)
GFR calc non Af Amer: >60 mL/min (ref >60)
Glucose, Bld: 165 mg/dL — ABNORMAL HIGH (ref 70–99)
Potassium: 4.7 mmol/L (ref 3.5–5.1)
Sodium: 134 mmol/L — ABNORMAL LOW (ref 135–145)
Total Bilirubin: 0.8 mg/dL (ref 0.3–1.2)
Total Protein: 6.7 g/dL (ref 6.5–8.1)

## 2020-03-29 LAB — HEMOGLOBIN A1C
Hgb A1c MFr Bld: 6.3 % — ABNORMAL HIGH (ref 4.8–5.6)
Mean Plasma Glucose: 134.11 mg/dL

## 2020-03-29 LAB — CBC WITH DIFFERENTIAL/PLATELET
Abs Immature Granulocytes: 0.05 10*3/uL (ref 0.00–0.07)
Basophils Absolute: 0 10*3/uL (ref 0.0–0.1)
Basophils Relative: 0 %
Eosinophils Absolute: 0 10*3/uL (ref 0.0–0.5)
Eosinophils Relative: 0 %
HCT: 44.9 % (ref 39.0–52.0)
Hemoglobin: 15.3 g/dL (ref 13.0–17.0)
Immature Granulocytes: 0 %
Lymphocytes Relative: 6 %
Lymphs Abs: 0.7 10*3/uL (ref 0.7–4.0)
MCH: 30.1 pg (ref 26.0–34.0)
MCHC: 34.1 g/dL (ref 30.0–36.0)
MCV: 88.2 fL (ref 80.0–100.0)
Monocytes Absolute: 0.3 10*3/uL (ref 0.1–1.0)
Monocytes Relative: 2 %
Neutro Abs: 11.5 10*3/uL — ABNORMAL HIGH (ref 1.7–7.7)
Neutrophils Relative %: 92 %
Platelets: 325 10*3/uL (ref 150–400)
RBC: 5.09 MIL/uL (ref 4.22–5.81)
RDW: 11.9 % (ref 11.5–15.5)
WBC: 12.6 10*3/uL — ABNORMAL HIGH (ref 4.0–10.5)
nRBC: 0 % (ref 0.0–0.2)

## 2020-03-29 LAB — C-REACTIVE PROTEIN: CRP: 2.6 mg/dL — ABNORMAL HIGH (ref <1.0)

## 2020-03-29 LAB — FERRITIN: Ferritin: 972 ng/mL — ABNORMAL HIGH (ref 24–336)

## 2020-03-29 LAB — GLUCOSE, CAPILLARY
Glucose-Capillary: 158 mg/dL — ABNORMAL HIGH (ref 70–99)
Glucose-Capillary: 149 mg/dL — ABNORMAL HIGH (ref 70–99)

## 2020-03-29 LAB — D-DIMER, QUANTITATIVE: D-Dimer, Quant: 1.31 ug/mL-FEU — ABNORMAL HIGH (ref 0.00–0.50)

## 2020-03-29 MED ORDER — POLYVINYL ALCOHOL 1.4 % OP SOLN
1.0000 [drp] | OPHTHALMIC | Status: DC | PRN
  Filled 2020-03-29 (×2): qty 15

## 2020-03-29 MED ORDER — TOCILIZUMAB 400 MG/20ML IV SOLN
8.0000 mg/kg | Freq: Once | INTRAVENOUS | Status: AC
  Administered 2020-03-29: 780 mg via INTRAVENOUS
  Filled 2020-03-29: qty 39

## 2020-03-29 MED ORDER — INSULIN ASPART 100 UNIT/ML ~~LOC~~ SOLN
0.0000 [IU] | Freq: Three times a day (TID) | SUBCUTANEOUS | Status: DC
  Administered 2020-03-29: 1 [IU] via SUBCUTANEOUS
  Administered 2020-03-29: 2 [IU] via SUBCUTANEOUS
  Administered 2020-03-30: 1 [IU] via SUBCUTANEOUS
  Administered 2020-03-30: 2 [IU] via SUBCUTANEOUS
  Administered 2020-03-30: 3 [IU] via SUBCUTANEOUS
  Administered 2020-03-31: 2 [IU] via SUBCUTANEOUS
  Administered 2020-03-31: 1 [IU] via SUBCUTANEOUS
  Administered 2020-03-31: 2 [IU] via SUBCUTANEOUS
  Administered 2020-04-01: 1 [IU] via SUBCUTANEOUS
  Administered 2020-04-01 (×2): 2 [IU] via SUBCUTANEOUS

## 2020-03-29 MED ORDER — ALBUTEROL SULFATE HFA 108 (90 BASE) MCG/ACT IN AERS
2.0000 | INHALATION_SPRAY | RESPIRATORY_TRACT | Status: DC
  Administered 2020-03-29 – 2020-04-01 (×17): 2 via RESPIRATORY_TRACT
  Filled 2020-03-29 (×2): qty 6.7

## 2020-03-29 NOTE — Progress Notes (Signed)
°   03/29/20 1557  Assess: MEWS Score  Temp 98.6 F (37 C)  BP 132/80  Pulse Rate 68  Resp (!) 26  Level of Consciousness Alert  SpO2 90 %  O2 Flow Rate (L/min) 25 L/min (heated high flow)  FiO2 (%) 100 %  Treat  Pain Scale 0-10  Pain Score 0  Take Vital Signs  Increase Vital Sign Frequency  Yellow: Q 2hr X 2 then Q 4hr X 2, if remains yellow, continue Q 4hrs  Escalate  MEWS: Escalate Yellow: discuss with charge nurse/RN and consider discussing with provider and RRT  Notify: Charge Nurse/RN  Name of Charge Nurse/RN Notified Deliah Goody  Date Charge Nurse/RN Notified 03/29/20  Notify: Provider  Provider Name/Title MD  Date Provider Notified 03/29/20  Notification Reason Other (Comment)  Response No new orders  Date of Provider Response 03/29/20  Document  Patient Outcome Stabilized after interventions  Progress note created (see row info) Yes

## 2020-03-29 NOTE — Progress Notes (Addendum)
Notified by [address] RN that patient was desaturating to 82-83%. Upon arrival, patient was saturating at 84-85% on 15LHFNC and 15L partial NRB. Placed patient on from partial NRB to just NRB 15L and HFNC 15L. Patient RR was 28-30, pulse on HR was 74, patient was not in any respiratory distress at the time, just fatigued, no complaints of chest pain. Patient alert and oriented x 4. Patient was able to use incentive spirometer and achieved 1000 ml. Patient currently sitting up in the chair. With the changes from partial NRB to NRB, patient's O2 saturations are at 87%-88%. Patient has orders to transfer to 4th floor progressive for heated HFNC.  Patient will transfer to 1443, room is currently being cleaned. Discussed transfer Ferdinand Cava RN, and Psychologist, prison and probation services.

## 2020-03-29 NOTE — Progress Notes (Signed)
   03/29/20 1559  Assess: MEWS Score  Pulse Rate 77  SpO2 90 %  O2 Device HFNC;Non-rebreather Mask  Heater temperature 87.8 F (31 C)  O2 Flow Rate (L/min) 25 L/min  FiO2 (%) 100 %  Assess: MEWS Score  MEWS Temp 0  MEWS Systolic 0  MEWS Pulse 0  MEWS RR 2  MEWS LOC 0  MEWS Score 2  MEWS Score Color Yellow

## 2020-03-29 NOTE — Progress Notes (Signed)
PROGRESS NOTE    Dennis Howe  ZOX:[id number] [name]: March 13, 1976 DOA: 03/25/2020 PCP: Pcp, No   Chief Complaint  Patient presents with  . Covid Positive   Brief Narrative:  44 year old gentleman with no significant medical history, not vaccinated against COVID-19 presented to the ER with intermittent substernal chest pain and shortness of breath worsening for 1 day, diagnosed with COVID-19 on 9/28 at outside facility.  He was also seen in the emergency room on 9/30 for nausea and vomiting and discharged home on conservative management.  Continue to have worsening symptoms so came back to the ER. In the emergency room, afebrile, tachypneic and tachycardic.  Initially requiring 2 L of oxygen.  Chest x-ray with bilateral consolidation.  Admitted with steroids and remdesivir. Patient initially admitted on 2 L oxygen, his oxygen requirement increased and currently on 15 L.  Assessment & Plan:   Principal Problem:   Acute hypoxemic respiratory failure due to COVID-19 [facility](HCC) Active Problems:   Chest pain   Elevated LFTs  Acute hypoxemic respiratory failure due to COVID-19 virus pneumonia: CXR 10/6 with multifocal airspace opacity with overall increase in opacity bilaterally compared to recent study Currently requiring 30 L HHFNC  Strict I/O, daily weights Continue steroids.  Remdesivir (10/2-10/6).  Actemra 10/3, will repeat today (10/6) with worsening oxygen requirement (discussed with pt, he was agreeable). Supplemental oxygen to keep saturations more than 85 %. Prone as able, OOB, flutter, IS, therapy   COVID-19 Labs  Recent Labs    03/27/20 0341 03/28/20 0522 03/29/20 0440  DDIMER 0.45 0.62* 1.31*  FERRITIN 2,430* 1,706* 972*  CRP 20.1* 7.3* 2.6*    Lab Results  Component Value Date   SARSCOV2NAA POSITIVE (Suprina Mandeville) 03/25/2020   Elevated D dimer: likely 2/2 covid infection, related to inflammation.  Will follow LE US and echo.  Low threshold for therapeutic  anticoagulation.  Hyperglycemia 2/2 steroids: follow a1c, SSI  Elevated LFT's: 2/2 covid, continue to monitor  DVT prophylaxis: lovenox Code Status: full  Family Communication: wife Disposition:   Status is: Inpatient  Remains inpatient appropriate because:Inpatient level of care appropriate due to severity of illness   Dispo: The patient is from: Home              Anticipated d/c is to: Home              Anticipated d/c date is: > 3 days              Patient currently is not medically stable to d/c.  Consultants:   none  Procedures:   none  Antimicrobials:  Anti-infectives (From admission, onward)   Start     Dose/Rate Route Frequency Ordered Stop   03/26/20 1000  remdesivir 100 mg in sodium chloride 0.9 % 100 mL IVPB       "Followed by" Linked Group Details   100 mg 200 mL/hr over 30 Minutes Intravenous Daily 03/25/20 1249 03/29/20 0946   03/26/20 1000  remdesivir 100 mg in sodium chloride 0.9 % 100 mL IVPB  Status:  Discontinued       "Followed by" Linked Group Details   100 mg 200 mL/hr over 30 Minutes Intravenous Daily 03/25/20 1720 03/25/20 1730   03/25/20 1720  remdesivir 200 mg in sodium chloride 0.9% 250 mL IVPB  Status:  Discontinued       "Followed by" Linked Group Details   200 mg 580 mL/hr over 30 Minutes Intravenous Once 03/25/20 1720 03/25/20 1730   03/25/20 1500  remdesivir 200 mg in sodium chloride 0.9% 250 mL IVPB       "Followed by" Linked Group Details   200 mg 580 mL/hr over 30 Minutes Intravenous Once 03/25/20 1249 03/25/20 1702     Subjective: C/o SOB  Objective: Vitals:   03/29/20 1559 03/29/20 1741 03/29/20 1932 03/29/20 2006  BP:      Pulse: 77 76    Resp:  (!) 32    Temp:  98.2 F (36.8 C)    TempSrc:  Oral    SpO2: 90% (!) 89% (!) 88% (!) 86%  Weight:      Height:        Intake/Output Summary (Last 24 hours) at 03/29/2020 2008 Last data filed at 03/29/2020 1557 Gross per 24 hour  Intake 826 ml  Output 1225 ml  Net -399  ml   Filed Weights   03/25/20 1328  Weight: 97.5 kg    Examination:  General exam: Appears calm and comfortable  Respiratory system: mild tachypnea, bibasilar crackles Cardiovascular system: S1 & S2 heard, RRR.  Gastrointestinal system: Abdomen is nondistended, soft and nontender.  Central nervous system: Alert and oriented. No focal neurological deficits. Extremities: no LEE Skin: No rashes, lesions or ulcers Psychiatry: Judgement and insight appear normal. Mood & affect appropriate.     Data Reviewed: I have personally reviewed following labs and imaging studies  CBC: Recent Labs  Lab 03/25/20 1310 03/26/20 0406 03/27/20 0341 03/28/20 0522 03/29/20 0440  WBC 5.3 8.1 10.9* 11.5* 12.6*  NEUTROABS 4.0 7.1 9.8* 10.4* 11.5*  HGB 16.5 14.5 14.6 15.5 15.3  HCT 48.2 42.1 42.7 44.9 44.9  MCV 87.3 86.6 87.7 88.0 88.2  PLT 213 214 270 307 325    Basic Metabolic Panel: Recent Labs  Lab 03/25/20 1310 03/26/20 0406 03/27/20 0341 03/28/20 0522 03/29/20 0440  NA 134* 132* 135 137 134*  K 4.6 4.1 4.4 4.6 4.7  CL 94* 96* 97* 101 103  CO2 25 23 27 24  21*  GLUCOSE 102* 153* 153* 169* 165*  BUN 16 19 23* 28* 26*  CREATININE 0.87 0.77 0.74 0.79 0.68  CALCIUM 8.9 8.1* 8.1* 8.0* 8.0*    GFR: Estimated Creatinine Clearance: 143.3 mL/min (by C-G formula based on SCr of 0.68 mg/dL).  Liver Function Tests: Recent Labs  Lab 03/25/20 1310 03/26/20 0406 03/27/20 0341 03/28/20 0522 03/29/20 0440  AST 141* 103* 73* 41 28  ALT 190* 167* 178* 130* 95*  ALKPHOS 188* 178* 146* 129* 109  BILITOT 1.0 0.8 0.7 0.7 0.8  PROT 9.1* 7.6 7.4 6.8 6.7  ALBUMIN 4.1 3.1* 3.0* 3.0* 3.0*    CBG: Recent Labs  Lab 03/29/20 1112 03/29/20 1809  GLUCAP 158* 149*     Recent Results (from the past 240 hour(s))  Respiratory Panel by RT PCR (Flu Leanndra Pember&B, Covid) - Nasopharyngeal Swab     Status: Abnormal   Collection Time: 03/25/20  1:10 PM   Specimen: Nasopharyngeal Swab  Result Value Ref  Range Status   SARS Coronavirus 2 by RT PCR POSITIVE (Deon Duer) NEGATIVE Final    Comment: RESULT CALLED TO, READ BACK BY AND VERIFIED WITH: M.QUICK,RN [id number] @1759  BY V.WILKINS (NOTE) SARS-CoV-2 target nucleic acids are DETECTED.  SARS-CoV-2 RNA is generally detectable in upper respiratory specimens  during the acute phase of infection. Positive results are indicative of the presence of the identified virus, but do not rule out bacterial infection or co-infection with other pathogens not detected by the test. Clinical correlation with patient history  and other diagnostic information is necessary to determine patient infection status. The expected result is Negative.  Fact Sheet for Patients:  https://www.moore.com/  Fact Sheet for Healthcare Providers: https://www.young.biz/  This test is not yet approved or cleared by the [place] FDA and  has been authorized for detection and/or diagnosis of SARS-CoV-2 by FDA under an Emergency Use Authorization (EUA).  This EUA will remain in effect (meaning this test can b e used) for the duration of  the COVID-19 declaration under Section 564(b)(1) of the Act, 21 U.S.C. section 360bbb-3(b)(1), unless the authorization is terminated or revoked sooner.      Influenza Lamija Besse by PCR NEGATIVE NEGATIVE Final   Influenza B by PCR NEGATIVE NEGATIVE Final    Comment: (NOTE) The Xpert Xpress SARS-CoV-2/FLU/RSV assay is intended as an aid in  the diagnosis of influenza from Nasopharyngeal swab specimens and  should not be used as Seward Coran sole basis for treatment. Nasal washings and  aspirates are unacceptable for Xpert Xpress SARS-CoV-2/FLU/RSV  testing.  Fact Sheet for Patients: https://www.moore.com/  Fact Sheet for Healthcare Providers: https://www.young.biz/  This test is not yet approved or cleared by the [place] FDA and  has been authorized for detection and/or  diagnosis of SARS-CoV-2 by  FDA under an Emergency Use Authorization (EUA). This EUA will remain  in effect (meaning this test can be used) for the duration of the  Covid-19 declaration under Section 564(b)(1) of the Act, 21  U.S.C. section 360bbb-3(b)(1), unless the authorization is  terminated or revoked. Performed at [facility], [address], [place], [place] [address]   Blood Culture (routine x 2)     Status: None (Preliminary result)   Collection Time: 03/25/20  1:10 PM   Specimen: BLOOD  Result Value Ref Range Status   Specimen Description   Final    BLOOD RIGHT ANTECUBITAL Performed at [facility], [address], [place], [place] [address]    Special Requests   Final    BOTTLES DRAWN AEROBIC AND ANAEROBIC Blood Culture adequate volume Performed at [facility], [address], [place], [place] [address]    Culture   Final    NO GROWTH 4 DAYS Performed at [facility], [address] [place], [place] [address]    Report Status PENDING  Incomplete  Blood Culture (routine x 2)     Status: None (Preliminary result)   Collection Time: 03/25/20  1:10 PM   Specimen: BLOOD LEFT HAND  Result Value Ref Range Status   Specimen Description   Final    BLOOD LEFT HAND Performed at [facility], [address] [place], [place] [address]    Special Requests   Final    BOTTLES DRAWN AEROBIC AND ANAEROBIC Blood Culture adequate volume Performed at [facility], [address], [place], [place] [address]    Culture   Final    NO GROWTH 4 DAYS Performed at [facility], [address], [place], [place] [address]    Report Status PENDING  Incomplete         Radiology Studies: DG CHEST PORT 1 VIEW  Result Date: 03/29/2020 CLINICAL DATA:  COVID-19 positive.  Pneumonia EXAM: PORTABLE CHEST 1 VIEW COMPARISON:  March 25, 2020 FINDINGS: Airspace opacity is seen throughout both mid  and lower lung regions with an overall increase in opacity on both sides. Patchy consolidation noted in left base. Heart is upper normal in size  with pulmonary vascularity normal. No adenopathy. No bone lesions. IMPRESSION: Multifocal airspace opacity with overall increase in opacity bilaterally compared to recent study. Patchy consolidation noted in the left base region with relative confluence of opacity in each mid lung region currently. Stable cardiac silhouette.  No adenopathy appreciable. Electronically Signed   By: Bretta Bang III M.D.   On: 03/29/2020 07:58        Scheduled Meds: . albuterol  2 puff Inhalation Q4H  . enoxaparin (LOVENOX) injection  40 mg Subcutaneous Q24H  . insulin aspart  0-9 Units Subcutaneous TID WC  . methylPREDNISolone (SOLU-MEDROL) injection  60 mg Intravenous Q12H   Continuous Infusions: . tocilizumab (ACTEMRA) - non-COVID treatment       LOS: 4 days    Time spent: over 30 min    Lacretia Nicks, MD [facility]   To contact the attending provider between 7A-7P or the covering provider during after hours 7P-7A, please log into the web site [email or web address] and access using universal [place] password for that web site. If you do not have the password, please call the hospital operator.  03/29/2020, 8:08 PM

## 2020-03-30 ENCOUNTER — Inpatient Hospital Stay (HOSPITAL_COMMUNITY): Payer: 59

## 2020-03-30 DIAGNOSIS — R7989 Other specified abnormal findings of blood chemistry: Secondary | ICD-10-CM

## 2020-03-30 DIAGNOSIS — R079 Chest pain, unspecified: Secondary | ICD-10-CM | POA: Diagnosis not present

## 2020-03-30 DIAGNOSIS — U071 COVID-19: Secondary | ICD-10-CM

## 2020-03-30 DIAGNOSIS — J9601 Acute respiratory failure with hypoxia: Secondary | ICD-10-CM | POA: Diagnosis not present

## 2020-03-30 LAB — MRSA PCR SCREENING: MRSA by PCR: NEGATIVE

## 2020-03-30 LAB — GLUCOSE, CAPILLARY
Glucose-Capillary: 139 mg/dL — ABNORMAL HIGH (ref 70–99)
Glucose-Capillary: 203 mg/dL — ABNORMAL HIGH (ref 70–99)
Glucose-Capillary: 155 mg/dL — ABNORMAL HIGH (ref 70–99)
Glucose-Capillary: 190 mg/dL — ABNORMAL HIGH (ref 70–99)

## 2020-03-30 LAB — CBC WITH DIFFERENTIAL/PLATELET
Abs Immature Granulocytes: 0.15 10*3/uL — ABNORMAL HIGH (ref 0.00–0.07)
Basophils Absolute: 0 10*3/uL (ref 0.0–0.1)
Basophils Relative: 0 %
Eosinophils Absolute: 0 10*3/uL (ref 0.0–0.5)
Eosinophils Relative: 0 %
HCT: 47 % (ref 39.0–52.0)
Hemoglobin: 15.9 g/dL (ref 13.0–17.0)
Immature Granulocytes: 2 %
Lymphocytes Relative: 7 %
Lymphs Abs: 0.7 10*3/uL (ref 0.7–4.0)
MCH: 30.1 pg (ref 26.0–34.0)
MCHC: 33.8 g/dL (ref 30.0–36.0)
MCV: 88.8 fL (ref 80.0–100.0)
Monocytes Absolute: 0.3 10*3/uL (ref 0.1–1.0)
Monocytes Relative: 3 %
Neutro Abs: 8.6 10*3/uL — ABNORMAL HIGH (ref 1.7–7.7)
Neutrophils Relative %: 88 %
Platelets: 390 10*3/uL (ref 150–400)
RBC: 5.29 MIL/uL (ref 4.22–5.81)
RDW: 12 % (ref 11.5–15.5)
WBC: 9.8 10*3/uL (ref 4.0–10.5)
nRBC: 0 % (ref 0.0–0.2)

## 2020-03-30 LAB — COMPREHENSIVE METABOLIC PANEL
ALT: 75 U/L — ABNORMAL HIGH (ref 0–44)
AST: 25 U/L (ref 15–41)
Albumin: 3 g/dL — ABNORMAL LOW (ref 3.5–5.0)
Alkaline Phosphatase: 107 U/L (ref 38–126)
Anion gap: 11 (ref 5–15)
BUN: 25 mg/dL — ABNORMAL HIGH (ref 6–20)
CO2: 22 mmol/L (ref 22–32)
Calcium: 8.6 mg/dL — ABNORMAL LOW (ref 8.9–10.3)
Chloride: 104 mmol/L (ref 98–111)
Creatinine, Ser: 0.7 mg/dL (ref 0.61–1.24)
GFR calc non Af Amer: >60 mL/min (ref >60)
Glucose, Bld: 146 mg/dL — ABNORMAL HIGH (ref 70–99)
Potassium: 4.8 mmol/L (ref 3.5–5.1)
Sodium: 137 mmol/L (ref 135–145)
Total Bilirubin: 0.8 mg/dL (ref 0.3–1.2)
Total Protein: 6.6 g/dL (ref 6.5–8.1)

## 2020-03-30 LAB — ECHOCARDIOGRAM COMPLETE
Height: 71 in
Weight: 3495.61 oz

## 2020-03-30 LAB — CULTURE, BLOOD (ROUTINE X 2)
Culture: NO GROWTH
Culture: NO GROWTH
Special Requests: ADEQUATE
Special Requests: ADEQUATE

## 2020-03-30 LAB — D-DIMER, QUANTITATIVE: D-Dimer, Quant: 1.48 ug/mL-FEU — ABNORMAL HIGH (ref 0.00–0.50)

## 2020-03-30 LAB — C-REACTIVE PROTEIN: CRP: 0.9 mg/dL (ref <1.0)

## 2020-03-30 LAB — BRAIN NATRIURETIC PEPTIDE: B Natriuretic Peptide: 103 pg/mL — ABNORMAL HIGH (ref 0.0–100.0)

## 2020-03-30 LAB — FERRITIN: Ferritin: 799 ng/mL — ABNORMAL HIGH (ref 24–336)

## 2020-03-30 MED ORDER — CHLORHEXIDINE GLUCONATE 0.12 % MT SOLN
15.0000 mL | Freq: Two times a day (BID) | OROMUCOSAL | Status: DC
  Administered 2020-03-31 – 2020-04-02 (×4): 15 mL via OROMUCOSAL
  Filled 2020-03-30 (×4): qty 15

## 2020-03-30 MED ORDER — FUROSEMIDE 10 MG/ML IJ SOLN
40.0000 mg | Freq: Once | INTRAMUSCULAR | Status: AC
  Administered 2020-03-30: 40 mg via INTRAVENOUS
  Filled 2020-03-30: qty 4

## 2020-03-30 MED ORDER — CHLORHEXIDINE GLUCONATE CLOTH 2 % EX PADS
6.0000 | MEDICATED_PAD | Freq: Every day | CUTANEOUS | Status: DC
  Administered 2020-03-30 – 2020-04-29 (×26): 6 via TOPICAL

## 2020-03-30 MED ORDER — ZINC SULFATE 220 (50 ZN) MG PO CAPS
220.0000 mg | ORAL_CAPSULE | Freq: Every day | ORAL | Status: DC
  Administered 2020-03-30 – 2020-04-01 (×3): 220 mg via ORAL
  Filled 2020-03-30 (×5): qty 1

## 2020-03-30 MED ORDER — METHYLPREDNISOLONE SODIUM SUCC 125 MG IJ SOLR
50.0000 mg | Freq: Two times a day (BID) | INTRAMUSCULAR | Status: DC
  Administered 2020-04-02 – 2020-04-04 (×3): 50 mg via INTRAVENOUS
  Filled 2020-03-30 (×2): qty 2

## 2020-03-30 MED ORDER — ASCORBIC ACID 500 MG PO TABS
500.0000 mg | ORAL_TABLET | Freq: Every day | ORAL | Status: DC
  Administered 2020-03-30 – 2020-04-02 (×4): 500 mg via ORAL
  Filled 2020-03-30 (×4): qty 1

## 2020-03-30 MED ORDER — POLYETHYLENE GLYCOL 3350 17 G PO PACK
17.0000 g | PACK | Freq: Two times a day (BID) | ORAL | Status: DC
  Administered 2020-03-30 – 2020-04-01 (×4): 17 g via ORAL
  Filled 2020-03-30 (×4): qty 1

## 2020-03-30 MED ORDER — SALINE SPRAY 0.65 % NA SOLN
1.0000 | NASAL | Status: DC | PRN
  Filled 2020-03-30: qty 44

## 2020-03-30 MED ORDER — METHYLPREDNISOLONE SODIUM SUCC 125 MG IJ SOLR
50.0000 mg | Freq: Four times a day (QID) | INTRAMUSCULAR | Status: AC
  Administered 2020-03-30 – 2020-04-02 (×12): 50 mg via INTRAVENOUS
  Filled 2020-03-30 (×11): qty 2

## 2020-03-30 MED ORDER — ORAL CARE MOUTH RINSE: 15.0000 mL | Freq: Two times a day (BID) | OROMUCOSAL | Status: DC

## 2020-03-30 NOTE — Progress Notes (Signed)
Notified MD of patient increase SOB, new orders placed, updated wife and patient of new medication and patient POC both verbalizing understanding.

## 2020-03-30 NOTE — Progress Notes (Signed)
VASCULAR LAB    Bilateral lower extremity venous duplex has been performed.  See CV proc for preliminary results.   Stanislaus Kaltenbach, RVT 03/30/2020, 12:28 PM  

## 2020-03-30 NOTE — Progress Notes (Addendum)
PROGRESS NOTE    Dennis Howe  [id number] [name]: 1975/09/15 DOA: 03/25/2020 PCP: Pcp, No   Chief Complaint  Patient presents with   Covid Positive   Brief Narrative:  44 year old gentleman with no significant medical history, not vaccinated against COVID-19 presented to the ER with intermittent substernal chest pain and shortness of breath worsening for 1 day, diagnosed with COVID-19 on 9/28 at outside facility.  He was also seen in the emergency room on 9/30 for nausea and vomiting and discharged home on conservative management.  Continue to have worsening symptoms so came back to the ER. In the emergency room, afebrile, tachypneic and tachycardic.  Initially requiring 2 L of oxygen.  Chest x-ray with bilateral consolidation.  Admitted with steroids and remdesivir. Patient initially admitted on 2 L oxygen, his oxygen requirement increased and currently on 15 L.  Assessment & Plan:   Principal Problem:   Acute hypoxemic respiratory failure due to COVID-19 [facility](HCC) Active Problems:   Chest pain   Elevated LFTs  Acute hypoxemic respiratory failure due to COVID-19 virus pneumonia: CXR 10/6 with multifocal airspace opacity with overall increase in opacity bilaterally compared to recent study Currently requiring 30 L HHFNC and wearing NRB as well, satting in low 90's Strict I/O, daily weights - lasix trial  Continue steroids.  Remdesivir (10/2-10/6).  Actemra 10/3, 10/6.  Supplemental oxygen to keep saturations more than 85 %. Prone as able, OOB, flutter, IS, therapy   COVID-19 Labs  Recent Labs    03/28/20 0522 03/29/20 0440 03/30/20 0436  DDIMER 0.62* 1.31* 1.48*  FERRITIN 1,706* 972* 799*  CRP 7.3* 2.6* 0.9    Lab Results  Component Value Date   SARSCOV2NAA POSITIVE (Rosemaria Inabinet) 03/25/2020   Elevated D dimer: rising.  likely 2/2 covid infection, related to inflammation.  Will follow LE US and echo.  Low threshold for therapeutic anticoagulation.  Prediabetes    Hyperglycemia 2/2 steroids: follow a1c (6.3), SSI  Elevated LFT's: 2/2 covid, continue to monitor  DVT prophylaxis: lovenox Code Status: full  Family Communication: wife 10/7 Disposition:   Status is: Inpatient  Remains inpatient appropriate because:Inpatient level of care appropriate due to severity of illness   Dispo: The patient is from: Home              Anticipated d/c is to: Home              Anticipated d/c date is: > 3 days              Patient currently is not medically stable to d/c.  Consultants:   none  Procedures:   none  Antimicrobials:  Anti-infectives (From admission, onward)   Start     Dose/Rate Route Frequency Ordered Stop   03/26/20 1000  remdesivir 100 mg in sodium chloride 0.9 % 100 mL IVPB       "Followed by" Linked Group Details   100 mg 200 mL/hr over 30 Minutes Intravenous Daily 03/25/20 1249 03/29/20 0946   03/26/20 1000  remdesivir 100 mg in sodium chloride 0.9 % 100 mL IVPB  Status:  Discontinued       "Followed by" Linked Group Details   100 mg 200 mL/hr over 30 Minutes Intravenous Daily 03/25/20 1720 03/25/20 1730   03/25/20 1720  remdesivir 200 mg in sodium chloride 0.9% 250 mL IVPB  Status:  Discontinued       "Followed by" Linked Group Details   200 mg 580 mL/hr over 30 Minutes Intravenous Once 03/25/20  1720 03/25/20 1730   03/25/20 1500  remdesivir 200 mg in sodium chloride 0.9% 250 mL IVPB       "Followed by" Linked Group Details   200 mg 580 mL/hr over 30 Minutes Intravenous Once 03/25/20 1249 03/25/20 1702     Subjective: Feels better than yesterday No new complaints Eager to get better Denies pain  Objective: Vitals:   03/30/20 0500 03/30/20 0558 03/30/20 0728 03/30/20 0740  BP:  109/82    Pulse:  70  86  Resp:  20  19  Temp:  97.7 F (36.5 C)    TempSrc:  Oral    SpO2:  92% (!) 88% (!) 86%  Weight: 99.1 kg     Height:        Intake/Output Summary (Last 24 hours) at 03/30/2020 0839 Last data filed at 03/30/2020  0555 Gross per 24 hour  Intake 826 ml  Output 1775 ml  Net -949 ml   Filed Weights   03/25/20 1328 03/30/20 0500  Weight: 97.5 kg 99.1 kg    Examination:  General: No acute distress. Cardiovascular: Heart sounds show Cayci Mcnabb regular rate, and rhythm. Lungs: scattered rales Abdomen: Soft, nontender, nondistended Neurological: Alert and oriented 3. Moves all extremities 4. Cranial nerves II through XII grossly intact. Skin: Warm and dry. No rashes or lesions. Extremities: No clubbing or cyanosis. No edema.   Data Reviewed: I have personally reviewed following labs and imaging studies  CBC: Recent Labs  Lab 03/26/20 0406 03/27/20 0341 03/28/20 0522 03/29/20 0440 03/30/20 0436  WBC 8.1 10.9* 11.5* 12.6* 9.8  NEUTROABS 7.1 9.8* 10.4* 11.5* 8.6*  HGB 14.5 14.6 15.5 15.3 15.9  HCT 42.1 42.7 44.9 44.9 47.0  MCV 86.6 87.7 88.0 88.2 88.8  PLT 214 270 307 325 390    Basic Metabolic Panel: Recent Labs  Lab 03/26/20 0406 03/27/20 0341 03/28/20 0522 03/29/20 0440 03/30/20 0436  NA 132* 135 137 134* 137  K 4.1 4.4 4.6 4.7 4.8  CL 96* 97* 101 103 104  CO2 23 27 24  21* 22  GLUCOSE 153* 153* 169* 165* 146*  BUN 19 23* 28* 26* 25*  CREATININE 0.77 0.74 0.79 0.68 0.70  CALCIUM 8.1* 8.1* 8.0* 8.0* 8.6*    GFR: Estimated Creatinine Clearance: 144.3 mL/min (by C-G formula based on SCr of 0.7 mg/dL).  Liver Function Tests: Recent Labs  Lab 03/26/20 0406 03/27/20 0341 03/28/20 0522 03/29/20 0440 03/30/20 0436  AST 103* 73* 41 28 25  ALT 167* 178* 130* 95* 75*  ALKPHOS 178* 146* 129* 109 107  BILITOT 0.8 0.7 0.7 0.8 0.8  PROT 7.6 7.4 6.8 6.7 6.6  ALBUMIN 3.1* 3.0* 3.0* 3.0* 3.0*    CBG: Recent Labs  Lab 03/29/20 1112 03/29/20 1809 03/30/20 0725  GLUCAP 158* 149* 139*     Recent Results (from the past 240 hour(s))  Respiratory Panel by RT PCR (Flu Katrell Milhorn&B, Covid) - Nasopharyngeal Swab     Status: Abnormal   Collection Time: 03/25/20  1:10 PM   Specimen:  Nasopharyngeal Swab  Result Value Ref Range Status   SARS Coronavirus 2 by RT PCR POSITIVE (Dennis Howe) NEGATIVE Final    Comment: RESULT CALLED TO, READ BACK BY AND VERIFIED WITH: M.QUICK,RN [id number] @1759  BY V.WILKINS (NOTE) SARS-CoV-2 target nucleic acids are DETECTED.  SARS-CoV-2 RNA is generally detectable in upper respiratory specimens  during the acute phase of infection. Positive results are indicative of the presence of the identified virus, but do not rule out bacterial infection  or co-infection with other pathogens not detected by the test. Clinical correlation with patient history and other diagnostic information is necessary to determine patient infection status. The expected result is Negative.  Fact Sheet for Patients:  https://www.moore.com/  Fact Sheet for Healthcare Providers: https://www.young.biz/  This test is not yet approved or cleared by the [place] FDA and  has been authorized for detection and/or diagnosis of SARS-CoV-2 by FDA under an Emergency Use Authorization (EUA).  This EUA will remain in effect (meaning this test can b e used) for the duration of  the COVID-19 declaration under Section 564(b)(1) of the Act, 21 U.S.C. section 360bbb-3(b)(1), unless the authorization is terminated or revoked sooner.      Influenza Amy Gothard by PCR NEGATIVE NEGATIVE Final   Influenza B by PCR NEGATIVE NEGATIVE Final    Comment: (NOTE) The Xpert Xpress SARS-CoV-2/FLU/RSV assay is intended as an aid in  the diagnosis of influenza from Nasopharyngeal swab specimens and  should not be used as Karsin Pesta sole basis for treatment. Nasal washings and  aspirates are unacceptable for Xpert Xpress SARS-CoV-2/FLU/RSV  testing.  Fact Sheet for Patients: https://www.moore.com/  Fact Sheet for Healthcare Providers: https://www.young.biz/  This test is not yet approved or cleared by the [place] FDA and  has been  authorized for detection and/or diagnosis of SARS-CoV-2 by  FDA under an Emergency Use Authorization (EUA). This EUA will remain  in effect (meaning this test can be used) for the duration of the  Covid-19 declaration under Section 564(b)(1) of the Act, 21  U.S.C. section 360bbb-3(b)(1), unless the authorization is  terminated or revoked. Performed at [facility], [address], [place], [place] [address]   Blood Culture (routine x 2)     Status: None (Preliminary result)   Collection Time: 03/25/20  1:10 PM   Specimen: BLOOD  Result Value Ref Range Status   Specimen Description   Final    BLOOD RIGHT ANTECUBITAL Performed at [facility], [address], [place], [place] [address]    Special Requests   Final    BOTTLES DRAWN AEROBIC AND ANAEROBIC Blood Culture adequate volume Performed at [facility], [address], [place], [place] [address]    Culture   Final    NO GROWTH 4 DAYS Performed at [facility], [address], [place], [place] [address]    Report Status PENDING  Incomplete  Blood Culture (routine x 2)     Status: None (Preliminary result)   Collection Time: 03/25/20  1:10 PM   Specimen: BLOOD LEFT HAND  Result Value Ref Range Status   Specimen Description   Final    BLOOD LEFT HAND Performed at [facility], [address] [place], [place] [address]    Special Requests   Final    BOTTLES DRAWN AEROBIC AND ANAEROBIC Blood Culture adequate volume Performed at [facility], [address] [place], [place] [address]    Culture   Final    NO GROWTH 4 DAYS Performed at [facility], [address], [place], [place] [address]    Report Status PENDING  Incomplete         Radiology Studies: DG CHEST PORT 1 VIEW  Result Date: 03/29/2020 CLINICAL DATA:  COVID-19 positive.  Pneumonia EXAM: PORTABLE CHEST 1 VIEW COMPARISON:  March 25, 2020 FINDINGS: Airspace  opacity is seen throughout both mid and lower lung regions with an overall increase in opacity  on both sides. Patchy consolidation noted in left base. Heart is upper normal in size with pulmonary vascularity normal. No adenopathy. No bone lesions. IMPRESSION: Multifocal airspace opacity with overall increase in opacity bilaterally compared to recent study. Patchy consolidation noted in the left base region with relative confluence of opacity in each mid lung region currently. Stable cardiac silhouette.  No adenopathy appreciable. Electronically Signed   By: Bretta Bang III M.D.   On: 03/29/2020 07:58        Scheduled Meds:  albuterol  2 puff Inhalation Q4H   enoxaparin (LOVENOX) injection  40 mg Subcutaneous Q24H   furosemide  40 mg Intravenous Once   insulin aspart  0-9 Units Subcutaneous TID WC   methylPREDNISolone (SOLU-MEDROL) injection  60 mg Intravenous Q12H   polyethylene glycol  17 g Oral BID   Continuous Infusions:    LOS: 5 days    Time spent: over 30 min 40 min critical care time with AHRF 2/2 COVID pneumonia requiring heated high flow nasal cannula     Lacretia Nicks, MD [facility]   To contact the attending provider between 7A-7P or the covering provider during after hours 7P-7A, please log into the web site [email or web address] and access using universal [place] password for that web site. If you do not have the password, please call the hospital operator.  03/30/2020, 8:39 AM

## 2020-03-30 NOTE — Progress Notes (Signed)
  Echocardiogram 2D Echocardiogram has been performed.  Dennis Howe Dennis Howe 03/30/2020, 2:24 PM

## 2020-03-31 ENCOUNTER — Inpatient Hospital Stay (HOSPITAL_COMMUNITY): Payer: 59

## 2020-03-31 DIAGNOSIS — U071 COVID-19: Secondary | ICD-10-CM | POA: Diagnosis not present

## 2020-03-31 DIAGNOSIS — J9312 Secondary spontaneous pneumothorax: Secondary | ICD-10-CM

## 2020-03-31 DIAGNOSIS — J9601 Acute respiratory failure with hypoxia: Secondary | ICD-10-CM | POA: Diagnosis not present

## 2020-03-31 LAB — CBC WITH DIFFERENTIAL/PLATELET
Abs Immature Granulocytes: 0.28 10*3/uL — ABNORMAL HIGH (ref 0.00–0.07)
Basophils Absolute: 0 10*3/uL (ref 0.0–0.1)
Basophils Relative: 0 %
Eosinophils Absolute: 0 10*3/uL (ref 0.0–0.5)
Eosinophils Relative: 0 %
HCT: 49.7 % (ref 39.0–52.0)
Hemoglobin: 17 g/dL (ref 13.0–17.0)
Immature Granulocytes: 3 %
Lymphocytes Relative: 6 %
Lymphs Abs: 0.6 10*3/uL — ABNORMAL LOW (ref 0.7–4.0)
MCH: 30 pg (ref 26.0–34.0)
MCHC: 34.2 g/dL (ref 30.0–36.0)
MCV: 87.7 fL (ref 80.0–100.0)
Monocytes Absolute: 0.2 10*3/uL (ref 0.1–1.0)
Monocytes Relative: 2 %
Neutro Abs: 9.3 10*3/uL — ABNORMAL HIGH (ref 1.7–7.7)
Neutrophils Relative %: 89 %
Platelets: 423 10*3/uL — ABNORMAL HIGH (ref 150–400)
RBC: 5.67 MIL/uL (ref 4.22–5.81)
RDW: 11.9 % (ref 11.5–15.5)
WBC: 10.4 10*3/uL (ref 4.0–10.5)
nRBC: 0 % (ref 0.0–0.2)

## 2020-03-31 LAB — COMPREHENSIVE METABOLIC PANEL
ALT: 70 U/L — ABNORMAL HIGH (ref 0–44)
AST: 29 U/L (ref 15–41)
Albumin: 3.3 g/dL — ABNORMAL LOW (ref 3.5–5.0)
Alkaline Phosphatase: 118 U/L (ref 38–126)
Anion gap: 14 (ref 5–15)
BUN: 32 mg/dL — ABNORMAL HIGH (ref 6–20)
CO2: 20 mmol/L — ABNORMAL LOW (ref 22–32)
Calcium: 8.5 mg/dL — ABNORMAL LOW (ref 8.9–10.3)
Chloride: 102 mmol/L (ref 98–111)
Creatinine, Ser: 0.72 mg/dL (ref 0.61–1.24)
GFR calc non Af Amer: >60 mL/min (ref >60)
Glucose, Bld: 173 mg/dL — ABNORMAL HIGH (ref 70–99)
Potassium: 4.9 mmol/L (ref 3.5–5.1)
Sodium: 136 mmol/L (ref 135–145)
Total Bilirubin: 1 mg/dL (ref 0.3–1.2)
Total Protein: 7.2 g/dL (ref 6.5–8.1)

## 2020-03-31 LAB — D-DIMER, QUANTITATIVE: D-Dimer, Quant: 1.98 ug/mL-FEU — ABNORMAL HIGH (ref 0.00–0.50)

## 2020-03-31 LAB — GLUCOSE, CAPILLARY
Glucose-Capillary: 187 mg/dL — ABNORMAL HIGH (ref 70–99)
Glucose-Capillary: 145 mg/dL — ABNORMAL HIGH (ref 70–99)
Glucose-Capillary: 170 mg/dL — ABNORMAL HIGH (ref 70–99)
Glucose-Capillary: 173 mg/dL — ABNORMAL HIGH (ref 70–99)

## 2020-03-31 LAB — C-REACTIVE PROTEIN: CRP: 0.6 mg/dL (ref <1.0)

## 2020-03-31 LAB — PHOSPHORUS: Phosphorus: 4.6 mg/dL (ref 2.5–4.6)

## 2020-03-31 LAB — MAGNESIUM: Magnesium: 2.6 mg/dL — ABNORMAL HIGH (ref 1.7–2.4)

## 2020-03-31 LAB — FERRITIN: Ferritin: 684 ng/mL — ABNORMAL HIGH (ref 24–336)

## 2020-03-31 NOTE — Consult Note (Signed)
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  Evaluated in ED 9/30 for nausea/vomiting.  He presented with substernal chest pain and shortness of breath on 10/2 Initially requiring 2 L of oxygen.  Was treated with steroids and remdesivir .  Developed progressive multifocal bilateral infiltrates and hypoxia, requiring high flow oxygen.  Received Actemra 10/3 & 10/6 .  Due to rising D-dimer lower extremity ultrasound and echo was obtained.  PCCM consulted 10/8 Past Medical History  None  Significant Hospital Events   10/2 hospital admission   Consults:    Procedures:    Significant Diagnostic Tests:  Chest x-ray 10/8 subcutaneous air in neck,?  Right apical pneumothorax 10/7 venous duplex negative BLE  10/3 echo [id number]  Micro Data:  10/2 Bc >> ng  Antimicrobials:     Interim history/subjective:  Lying in bed, left lateral decubitus position, Complains of dyspnea  Objective   Blood pressure (!) 151/82, pulse 84, temperature 98.2 F (36.8 C), temperature source Axillary, resp. rate (!) 29, height 5\' 11"  (1.803 m), weight 99.1 kg, SpO2 92 %.    FiO2 (%):  [100 %] 100 %   Intake/Output Summary (Last 24 hours) at 03/31/2020 1033 Last data filed at 03/31/2020 1000 Gross per 24 hour  Intake 240 ml  Output 800 ml  Net -560 ml   Filed Weights   03/25/20 1328 03/30/20 0500 03/31/20 0500  Weight: 97.5 kg 99.1 kg 99.1 kg    Examination: General: Middle-age man, left lateral decubitus position, anxious affect HENT:  Mild pallor, no icterus, no JVD Lungs: Mild accessory muscle use, RR 18-20, bilateral scattered crackles, no chest wall crepitus Cardiovascular: S1-S2 regular Abdomen: Soft nontender abdomen Extremities: No edema Neuro: Alert oriented, nonfocal   Chest x-ray 10/8 independently reviewed, stable bilateral infiltrates, subcutaneous air in the neck,?  Right apical pneumothorax  Resolved Hospital Problem list     Assessment & Plan:  Acute hypoxic respiratory failure due to COVID pneumonia  -Currently on high flow nasal cannula , tolerating well with decreased work of breathing however remains severely hypoxic and at high risk for intubation mechanical ventilation -Has completed remdesivir, continue steroids Has received Actemra  ?  Right apical pneumothorax noted on chest x-ray 10/8 There is some evidence of pneumomediastinum -Follow-up chest x-ray in 4 hours to clarify, if pneumothorax definitely present or worse, will place chest tube   Best practice:  Diet: Clear liquid Pain/Anxiety/Delirium protocol (if indicated): N/A VAP protocol (if indicated): N/A DVT prophylaxis: Lovenox GI prophylaxis: N/A Glucose control: SSI while on steroids Mobility: Bedrest with self proning as tolerated Code Status: Full Family Communication: Per primary Disposition: ICU  Labs   CBC: Recent Labs  Lab 03/27/20 0341 03/28/20 0522 03/29/20 0440 03/30/20 0436 03/31/20 0253  WBC 10.9* 11.5* 12.6* 9.8 10.4  NEUTROABS 9.8* 10.4* 11.5* 8.6* 9.3*  HGB 14.6 15.5 15.3 15.9 17.0  HCT 42.7 44.9 44.9 47.0 49.7  MCV 87.7 88.0 88.2 88.8 87.7  PLT 270 307 325 390 423*    Basic Metabolic Panel: Recent Labs  Lab 03/27/20 0341 03/28/20  0522 03/29/20 0440 03/30/20 0436 03/31/20 0253  NA 135 137 134* 137 136  K 4.4 4.6 4.7 4.8 4.9  CL 97* 101 103 104 102  CO2 27 24 21* 22 20*  GLUCOSE 153* 169* 165* 146* 173*  BUN 23* 28* 26* 25* 32*  CREATININE 0.74 0.79 0.68 0.70 0.72  CALCIUM 8.1* 8.0*  8.0* 8.6* 8.5*  MG  --   --   --   --  2.6*  PHOS  --   --   --   --  4.6   GFR: Estimated Creatinine Clearance: 144.3 mL/min (by C-G formula based on SCr of 0.72 mg/dL). Recent Labs  Lab 03/25/20 1310 03/26/20 0406 03/28/20 0522 03/29/20 0440 03/30/20 0436 03/31/20 0253  PROCALCITON 0.10  --   --   --   --   --   WBC 5.3   < > 11.5* 12.6* 9.8 10.4  LATICACIDVEN 1.2  --   --   --   --   --    < > = values in this interval not displayed.    Liver Function Tests: Recent Labs  Lab 03/27/20 0341 03/28/20 0522 03/29/20 0440 03/30/20 0436 03/31/20 0253  AST 73* 41 28 25 29   ALT 178* 130* 95* 75* 70*  ALKPHOS 146* 129* 109 107 118  BILITOT 0.7 0.7 0.8 0.8 1.0  PROT 7.4 6.8 6.7 6.6 7.2  ALBUMIN 3.0* 3.0* 3.0* 3.0* 3.3*   No results for input(s): LIPASE, AMYLASE in the last 168 hours. No results for input(s): AMMONIA in the last 168 hours.  ABG No results found for: PHART, PCO2ART, PO2ART, HCO3, TCO2, ACIDBASEDEF, O2SAT   Coagulation Profile: No results for input(s): INR, PROTIME in the last 168 hours.  Cardiac Enzymes: No results for input(s): CKTOTAL, CKMB, CKMBINDEX, TROPONINI in the last 168 hours.  HbA1C: Hgb A1c MFr Bld  Date/Time Value Ref Range Status  03/29/2020 04:40 AM 6.3 (H) 4.8 - 5.6 % Final    Comment:    (NOTE) Pre diabetes:          5.7%-6.4%  Diabetes:              >6.4%  Glycemic control for   <7.0% adults with diabetes     CBG: Recent Labs  Lab 03/30/20 0725 03/30/20 1156 03/30/20 1641 03/30/20 2228 03/31/20 0733  GLUCAP 139* 203* 155* 190* 187*    Review of Systems:   Shortness of breath at rest and exertion Dry cough Body aches No chest pain or palpitations No nausea, diarrhea, vomiting abdominal pain Making urine   Past Medical History  He,  has a past medical history of BMI 30.0-30.9,adult.   Surgical History   History reviewed. No pertinent surgical history.   Social History      Family History   His family  history is not on file.   Allergies No Known Allergies   Home Medications  Prior to Admission medications   Medication Sig Start Date End Date Taking? Authorizing Provider  benzonatate (TESSALON) 100 MG capsule Take 1 capsule (100 mg total) by mouth every 8 (eight) hours. 03/23/20  Yes Fondaw, Wylder S, PA  ondansetron (ZOFRAN ODT) 4 MG disintegrating tablet Take 1 tablet (4 mg total) by mouth every 8 (eight) hours as needed for nausea or vomiting. 03/23/20  Yes Fondaw, Wylder S, PA  promethazine-dextromethorphan (PROMETHAZINE-DM) 6.25-15 MG/5ML syrup Take 5 mLs by mouth 4 (four) times daily as needed for cough. 03/23/20  Yes 03/25/20, PA  Critical care time: 47m     Cyril Mourning MD. [facility]. [facility] See Amion for pager  If no response to pager , please call [phone]  After 7:00 pm call Elink  [phone]   03/31/2020

## 2020-03-31 NOTE — Progress Notes (Signed)
Radiology called this RN with concern for R pneumothorax and subcutaneous air. Anders Simmonds, critical care NP notified and reviewed images. Per CCM, continue to monitor for worsening symptoms to assess the need for a chest tube down the road.

## 2020-03-31 NOTE — Progress Notes (Addendum)
Spiritual Care received referral to this patient through local minister who has been supporting pt spouse.  Also consult from Dr Lowell Guitar .    Provided spiritual support with Ryder at bedside.  He notes exhaustion and hope for rest.  He speaks with chaplain about his family - spouse and 6 children. His youngest is 42 y/o.   Requests prayers.  Chaplain shared prayers for rest and encouragement.    Tejuan wished chaplain to report visit to [place].   Spiritual care will follow with this patient and family member for continued support.

## 2020-03-31 NOTE — Progress Notes (Addendum)
PROGRESS NOTE    Dennis Howe  ZOX:[id number] [name]: 04-08-1976 DOA: 03/25/2020 PCP: Pcp, No   Chief Complaint  Patient presents with  . Covid Positive   Brief Narrative:  44 year old gentleman with no significant medical history, not vaccinated against COVID-19 presented to the ER with intermittent substernal chest pain and shortness of breath worsening for 1 day, diagnosed with COVID-19 on 9/28 at outside facility.  He was also seen in the emergency room on 9/30 for nausea and vomiting and discharged home on conservative management.  Continue to have worsening symptoms so came back to the ER. In the emergency room, afebrile, tachypneic and tachycardic.  Initially requiring 2 L of oxygen.  Chest x-ray with bilateral consolidation.  Admitted with steroids and remdesivir. Patient initially admitted on 2 L oxygen, his oxygen requirement increased and currently on 15 L.  Assessment & Plan:   Principal Problem:   Acute hypoxemic respiratory failure due to COVID-19 [facility]) Active Problems:   Chest pain   Elevated LFTs  Acute hypoxemic respiratory failure due to COVID-19 virus pneumonia: CXR 10/6 with multifocal airspace opacity with overall increase in opacity bilaterally compared to recent study Currently requiring 50 L HHFNC and wearing NRB as well, satting in high 80's Strict I/O, daily weights Continue steroids.  Remdesivir (10/2-10/6).  Actemra 10/3, 10/6.  Supplemental oxygen to keep saturations more than 85 %. Prone as able, OOB, flutter, IS, therapy  PCCM consult, appreciate recommendations  COVID-19 Labs  Recent Labs    03/29/20 0440 03/30/20 0436 03/31/20 0253  DDIMER 1.31* 1.48* 1.98*  FERRITIN 972* 799* 684*  CRP 2.6* 0.9 0.6    Lab Results  Component Value Date   SARSCOV2NAA POSITIVE (Mairead Schwarzkopf) 03/25/2020   Possible Small R Apical Pneumothorax  Pneumomediastinum  Subcutaneous Emphysema: continue to follow, appreciate PCCM consult    Elevated D dimer: rising.   likely 2/2 covid infection, related to inflammation.  Will follow LE [place] (negative for DVT).  Echo 10/3 with normal EF, no RWMA, RVSF normal.  Low threshold for therapeutic anticoagulation.  Prediabetes  Hyperglycemia 2/2 steroids: follow a1c (6.3), SSI  Elevated LFT's: 2/2 covid, continue to monitor  DVT prophylaxis: lovenox Code Status: full  Family Communication: wife 10/8 Disposition:   Status is: Inpatient  Remains inpatient appropriate because:Inpatient level of care appropriate due to severity of illness   Dispo: The patient is from: Home              Anticipated d/c is to: Home              Anticipated d/c date is: > 3 days              Patient currently is not medically stable to d/c.  Consultants:   none  Procedures:  Echo IMPRESSIONS    1. Left ventricular ejection fraction, by estimation, is 65 to 70%. The  left ventricle has normal function. The left ventricle has no regional  wall motion abnormalities. Left ventricular diastolic parameters were  normal.  2. Right ventricular systolic function is normal. The right ventricular  size is normal.  3. The mitral valve is normal in structure. No evidence of mitral valve  regurgitation. No evidence of mitral stenosis.  4. The aortic valve is tricuspid.  5. Limited echo to evaluate LV and RV function   LE [place] Summary:  BILATERAL:  - No evidence of deep vein thrombosis seen in the lower extremities,  bilaterally.  -   Echo IMPRESSIONS  1. The inferior vena cava is normal in size with greater than 50%  respiratory variability, suggesting right atrial pressure of 3 mmHg.  2. I cannot see anything but the IVC and the right atrium on this echo.  Not an interpretable study.    Antimicrobials:  Anti-infectives (From admission, onward)   Start     Dose/Rate Route Frequency Ordered Stop   03/26/20 1000  remdesivir 100 mg in sodium chloride 0.9 % 100 mL IVPB       "Followed by" Linked Group Details    100 mg 200 mL/hr over 30 Minutes Intravenous Daily 03/25/20 1249 03/29/20 0946   03/26/20 1000  remdesivir 100 mg in sodium chloride 0.9 % 100 mL IVPB  Status:  Discontinued       "Followed by" Linked Group Details   100 mg 200 mL/hr over 30 Minutes Intravenous Daily 03/25/20 1720 03/25/20 1730   03/25/20 1720  remdesivir 200 mg in sodium chloride 0.9% 250 mL IVPB  Status:  Discontinued       "Followed by" Linked Group Details   200 mg 580 mL/hr over 30 Minutes Intravenous Once 03/25/20 1720 03/25/20 1730   03/25/20 1500  remdesivir 200 mg in sodium chloride 0.9% 250 mL IVPB       "Followed by" Linked Group Details   200 mg 580 mL/hr over 30 Minutes Intravenous Once 03/25/20 1249 03/25/20 1702     Subjective: No new complaints SOB, continued No pain  Objective: Vitals:   03/31/20 0700 03/31/20 0800 03/31/20 0849 03/31/20 0900  BP: (!) 153/90 (!) 146/112  (!) 164/98  Pulse: 90 88 93 84  Resp: 17 (!) 26 15 15   Temp:  98.2 F (36.8 C)    TempSrc:  Axillary    SpO2: 92% 92% (!) 89%   Weight:      Height:        Intake/Output Summary (Last 24 hours) at 03/31/2020 1024 Last data filed at 03/30/2020 1800 Gross per 24 hour  Intake --  Output 800 ml  Net -800 ml   Filed Weights   03/25/20 1328 03/30/20 0500 03/31/20 0500  Weight: 97.5 kg 99.1 kg 99.1 kg    Examination:  General: appears anxious Cardiovascular: Heart sounds show Deklan Minar regular rate, and rhythm. Lungs: scattered crackles, tachypneic Abdomen: Soft, nontender, nondistended Neurological: Alert and oriented 3. Moves all extremities 4 . Cranial nerves II through XII grossly intact. Skin: Warm and dry. No rashes or lesions. Extremities: No clubbing or cyanosis. No edema.   Data Reviewed: I have personally reviewed following labs and imaging studies  CBC: Recent Labs  Lab 03/27/20 0341 03/28/20 0522 03/29/20 0440 03/30/20 0436 03/31/20 0253  WBC 10.9* 11.5* 12.6* 9.8 10.4  NEUTROABS 9.8* 10.4* 11.5* 8.6*  9.3*  HGB 14.6 15.5 15.3 15.9 17.0  HCT 42.7 44.9 44.9 47.0 49.7  MCV 87.7 88.0 88.2 88.8 87.7  PLT 270 307 325 390 423*    Basic Metabolic Panel: Recent Labs  Lab 03/27/20 0341 03/28/20 0522 03/29/20 0440 03/30/20 0436 03/31/20 0253  NA 135 137 134* 137 136  K 4.4 4.6 4.7 4.8 4.9  CL 97* 101 103 104 102  CO2 27 24 21* 22 20*  GLUCOSE 153* 169* 165* 146* 173*  BUN 23* 28* 26* 25* 32*  CREATININE 0.74 0.79 0.68 0.70 0.72  CALCIUM 8.1* 8.0* 8.0* 8.6* 8.5*  MG  --   --   --   --  2.6*  PHOS  --   --   --   --  4.6    GFR: Estimated Creatinine Clearance: 144.3 mL/min (by C-G formula based on SCr of 0.72 mg/dL).  Liver Function Tests: Recent Labs  Lab 03/27/20 0341 03/28/20 0522 03/29/20 0440 03/30/20 0436 03/31/20 0253  AST 73* 41 28 25 29   ALT 178* 130* 95* 75* 70*  ALKPHOS 146* 129* 109 107 118  BILITOT 0.7 0.7 0.8 0.8 1.0  PROT 7.4 6.8 6.7 6.6 7.2  ALBUMIN 3.0* 3.0* 3.0* 3.0* 3.3*    CBG: Recent Labs  Lab 03/30/20 0725 03/30/20 1156 03/30/20 1641 03/30/20 2228 03/31/20 0733  GLUCAP 139* 203* 155* 190* 187*     Recent Results (from the past 240 hour(s))  Respiratory Panel by RT PCR (Flu Onetta Spainhower&B, Covid) - Nasopharyngeal Swab     Status: Abnormal   Collection Time: 03/25/20  1:10 PM   Specimen: Nasopharyngeal Swab  Result Value Ref Range Status   SARS Coronavirus 2 by RT PCR POSITIVE (Karliah Kowalchuk) NEGATIVE Final    Comment: RESULT CALLED TO, READ BACK BY AND VERIFIED WITH: M.QUICK,RN [id number] @1759  BY V.WILKINS (NOTE) SARS-CoV-2 target nucleic acids are DETECTED.  SARS-CoV-2 RNA is generally detectable in upper respiratory specimens  during the acute phase of infection. Positive results are indicative of the presence of the identified virus, but do not rule out bacterial infection or co-infection with other pathogens not detected by the test. Clinical correlation with patient history and other diagnostic information is necessary to determine patient infection  status. The expected result is Negative.  Fact Sheet for Patients:  05/25/20  Fact Sheet for Healthcare Providers:  This test is not yet approved or cleared by the https://www.moore.com/ FDA and  has been authorized for detection and/or diagnosis of SARS-CoV-2 by FDA under an Emergency Use Authorization (EUA).  This EUA will remain in effect (meaning this test can b e used) for the duration of  the COVID-19 declaration under Section 564(b)(1) of the Act, 21 U.S.C. section 360bbb-3(b)(1), unless the authorization is terminated or revoked sooner.      Influenza Fallan Mccarey by PCR NEGATIVE NEGATIVE Final   Influenza B by PCR NEGATIVE NEGATIVE Final    Comment: (NOTE) The Xpert Xpress SARS-CoV-2/FLU/RSV assay is intended as an aid in  the diagnosis of influenza from Nasopharyngeal swab specimens and  should not be used as Larhonda Dettloff sole basis for treatment. Nasal washings and  aspirates are unacceptable for Xpert Xpress SARS-CoV-2/FLU/RSV  testing.  Fact Sheet for Patients: https://www.young.biz/  Fact Sheet for Healthcare Providers: [place]  This test is not yet approved or cleared by the https://www.moore.com/ FDA and  has been authorized for detection and/or diagnosis of SARS-CoV-2 by  FDA under an Emergency Use Authorization (EUA). This EUA will remain  in effect (meaning this test can be used) for the duration of the  Covid-19 declaration under Section 564(b)(1) of the Act, 21  U.S.C. section 360bbb-3(b)(1), unless the authorization is  terminated or revoked. Performed at [facility], [address], [place], [place]   Blood Culture (routine x 2)     Status: None   Collection Time: 03/25/20  1:10 PM   Specimen: BLOOD  Result Value Ref Range Status   Specimen Description   Final    BLOOD RIGHT ANTECUBITAL Performed at [facility], [address] [place], [place]    Special Requests   Final    BOTTLES DRAWN AEROBIC AND ANAEROBIC Blood Culture adequate volume Performed at [facility], [address], [place], [place] [address]    Culture   Final    NO GROWTH 5 DAYS Performed at [facility], [address] [place], [place] [address]    Report Status 03/30/2020 FINAL  Final  Blood Culture (routine x 2)     Status: None   Collection Time: 03/25/20  1:10 PM   Specimen: BLOOD LEFT HAND  Result Value Ref Range Status   Specimen Description   Final    BLOOD LEFT HAND Performed at [facility], [address] [place], [place] [address]    Special Requests   Final    BOTTLES DRAWN AEROBIC AND ANAEROBIC Blood Culture adequate volume Performed at [facility], [address] [place], [place] [address]    Culture   Final    NO GROWTH 5 DAYS Performed at [facility], [address], [place], [place] [address]    Report Status 03/30/2020 FINAL  Final  MRSA PCR Screening     Status: None   Collection Time: 03/30/20  6:50 PM   Specimen: Nasal Mucosa; Nasopharyngeal  Result Value Ref Range Status   MRSA by PCR NEGATIVE NEGATIVE Final    Comment:        The GeneXpert MRSA Assay (FDA approved for NASAL specimens only), is one component of Mylasia Vorhees comprehensive MRSA colonization surveillance program. It is not intended to diagnose MRSA infection nor to guide or monitor treatment for MRSA infections. Performed at [facility], [address], [place], [place] [address]          Radiology Studies: DG CHEST PORT 1 VIEW  Result Date: 03/31/2020 CLINICAL DATA:  Hypoxia, COVID-19 positive EXAM: PORTABLE CHEST 1 VIEW COMPARISON:  03/29/2020 FINDINGS: Stable cardiomediastinal contours. Diffuse airspace opacities throughout both lungs with improved aeration of the right upper lobe. Interval development of subcutaneous  emphysema within the soft tissues of the neck, right greater than left. Possible small right apical pneumothorax. IMPRESSION: 1. Possible small right apical pneumothorax. Findings could be artifactual related to chest wall subcutaneous emphysema or skin fold. Recommend repeat upright chest x-ray after patient repositioning. 2. Interval development of subcutaneous emphysema within the soft tissues of the neck, right greater than left. 3. Improved aeration of the right upper lobe. These results will be called to the ordering clinician or representative by the Radiologist Assistant, and communication documented in the PACS or [employer]. Electronically Signed   By: Duanne Guess D.O.   On: 03/31/2020 08:19   ECHOCARDIOGRAM COMPLETE  Result Date: 03/30/2020    ECHOCARDIOGRAM REPORT   Patient Name:   TAMEL ABEL Date of Exam: 03/30/2020 Medical Rec #:  [id number]             Height:       71.0 in Accession #:    [id number]            Weight:       218.5 lb Date of Birth:  1976/03/03             BSA:          2.189 m Patient Age:    44 years              BP:           109/82 mmHg Patient Gender: M                     HR:  85 bpm. Exam Location:  Inpatient Procedure: 2D Echo, Color Doppler and Cardiac Doppler Indications:    Positive D Dimer. [id number]  History:        Patient has no prior history of Echocardiogram examinations.                 COVID-19 Positive.  Sonographer:    Tiffany Dance Referring Phys: [id number] Deniece Rankin CALDWELL POWELL JR  Sonographer Comments: Technically difficult study due to poor echo windows, no parasternal window, no apical window and suboptimal subcostal window. IMPRESSIONS  1. The inferior vena cava is normal in size with greater than 50% respiratory variability, suggesting right atrial pressure of 3 mmHg.  2. I cannot see anything but the IVC and the right atrium on this echo. Not an interpretable study. FINDINGS  Left Ventricle: Left ventricular ejection fraction, by  estimation, is 60 to 65%. The left ventricle has normal function. Left ventricular endocardial border not optimally defined to evaluate regional wall motion. The left ventricular internal cavity size was normal in size. There is no left ventricular hypertrophy. Left ventricular diastolic function could not be evaluated. Right Ventricle: The right ventricular size is not well visualized. Right vetricular wall thickness was not well visualized. Right ventricular systolic function was not well visualized. Left Atrium: Left atrial size was not well visualized. Right Atrium: Right atrial size was not well visualized. Pericardium: The pericardium was not well visualized. Mitral Valve: The mitral valve was not well visualized. No evidence of mitral valve regurgitation. Tricuspid Valve: The tricuspid valve is not well visualized. Tricuspid valve regurgitation is not demonstrated. Aortic Valve: The aortic valve was not well visualized. Aortic valve regurgitation is not visualized. Pulmonic Valve: The pulmonic valve was not well visualized. Pulmonic valve regurgitation is not visualized. Aorta: The aortic root was not well visualized. Venous: The inferior vena cava is normal in size with greater than 50% respiratory variability, suggesting right atrial pressure of 3 mmHg. IAS/Shunts: The interatrial septum was not assessed.  IVC IVC diam: 1.50 cm Marca Ancona MD Electronically signed by Marca Ancona MD Signature Date/Time: 03/30/2020/5:13:38 PM    Final    VAS [place] LOWER EXTREMITY VENOUS (DVT)  Result Date: 03/30/2020  Lower Venous DVT Study Indications: Covid-19, elevated D-Dimer.  Comparison Study: No prior study on file Performing Technologist: Sherren Kerns RVS  Examination Guidelines: Pegi Milazzo complete evaluation includes B-mode imaging, spectral Doppler, color Doppler, and power Doppler as needed of all accessible portions of each vessel. Bilateral testing is considered an integral part of Tung Pustejovsky complete examination. Limited  examinations for reoccurring indications may be performed as noted. The reflux portion of the exam is performed with the patient in reverse Trendelenburg.  +---------+---------------+---------+-----------+----------+--------------+ RIGHT    CompressibilityPhasicitySpontaneityPropertiesThrombus Aging +---------+---------------+---------+-----------+----------+--------------+ CFV      Full           Yes      Yes                                 +---------+---------------+---------+-----------+----------+--------------+ SFJ      Full                                                        +---------+---------------+---------+-----------+----------+--------------+ FV Prox  Full                                                        +---------+---------------+---------+-----------+----------+--------------+  FV Mid   Full                                                        +---------+---------------+---------+-----------+----------+--------------+ FV DistalFull                                                        +---------+---------------+---------+-----------+----------+--------------+ PFV      Full                                                        +---------+---------------+---------+-----------+----------+--------------+ POP      Full           Yes      Yes                                 +---------+---------------+---------+-----------+----------+--------------+ PTV      Full                                                        +---------+---------------+---------+-----------+----------+--------------+ PERO     Full                                                        +---------+---------------+---------+-----------+----------+--------------+   +---------+---------------+---------+-----------+----------+--------------+ LEFT     CompressibilityPhasicitySpontaneityPropertiesThrombus Aging  +---------+---------------+---------+-----------+----------+--------------+ CFV      Full           Yes      Yes                                 +---------+---------------+---------+-----------+----------+--------------+ SFJ      Full                                                        +---------+---------------+---------+-----------+----------+--------------+ FV Prox  Full                                                        +---------+---------------+---------+-----------+----------+--------------+ FV Mid   Full                                                        +---------+---------------+---------+-----------+----------+--------------+   FV DistalFull                                                        +---------+---------------+---------+-----------+----------+--------------+ PFV      Full                                                        +---------+---------------+---------+-----------+----------+--------------+ POP      Full           Yes      Yes                                 +---------+---------------+---------+-----------+----------+--------------+ PTV      Full                                                        +---------+---------------+---------+-----------+----------+--------------+ PERO     Full                                                        +---------+---------------+---------+-----------+----------+--------------+     Summary: BILATERAL: - No evidence of deep vein thrombosis seen in the lower extremities, bilaterally. -   *See table(s) above for measurements and observations. Electronically signed by Sherald Hesshristopher Clark MD on 03/30/2020 at 3:26:58 PM.    Final         Scheduled Meds: . albuterol  2 puff Inhalation Q4H  . vitamin C  500 mg Oral Daily  . chlorhexidine  15 mL Mouth Rinse BID  . Chlorhexidine Gluconate Cloth  6 each Topical Daily  . enoxaparin (LOVENOX) injection  40 mg Subcutaneous Q24H   . insulin aspart  0-9 Units Subcutaneous TID WC  . mouth rinse  15 mL Mouth Rinse q12n4p  . methylPREDNISolone (SOLU-MEDROL) injection  50 mg Intravenous Q6H  . [START ON 04/03/2020] methylPREDNISolone (SOLU-MEDROL) injection  50 mg Intravenous Q12H  . polyethylene glycol  17 g Oral BID  . zinc sulfate  220 mg Oral Daily   Continuous Infusions:    LOS: 6 days    Time spent: over 30 min 40 min critical care time with AHRF 2/2 COVID pneumonia requiring heated high flow nasal cannula     Lacretia Nicksaldwell Powell, MD [facility]   To contact the attending provider between 7A-7P or the covering provider during after hours 7P-7A, please log into the web site [email or web address] and access using universal [place] password for that web site. If you do not have the password, please call the hospital operator.  03/31/2020, 10:24 AM

## 2020-03-31 NOTE — Progress Notes (Signed)
Assisted tele visit to patient with family members.  Mauri Temkin M, RN  

## 2020-04-01 ENCOUNTER — Inpatient Hospital Stay (HOSPITAL_COMMUNITY): Payer: 59

## 2020-04-01 DIAGNOSIS — J9601 Acute respiratory failure with hypoxia: Secondary | ICD-10-CM | POA: Diagnosis not present

## 2020-04-01 DIAGNOSIS — U071 COVID-19: Secondary | ICD-10-CM | POA: Diagnosis not present

## 2020-04-01 DIAGNOSIS — J982 Interstitial emphysema: Secondary | ICD-10-CM | POA: Diagnosis not present

## 2020-04-01 LAB — BLOOD GAS, ARTERIAL
Acid-base deficit: 5.2 mmol/L — ABNORMAL HIGH (ref 0.0–2.0)
Bicarbonate: 23.2 mmol/L (ref 20.0–28.0)
Drawn by: 308601
FIO2: 100
MECHVT: 450 mL
O2 Saturation: 93.4 %
PEEP: 14 cmH2O
Patient temperature: 98.7
RATE: 32 resp/min
pCO2 arterial: 56 mmHg — ABNORMAL HIGH (ref 32.0–48.0)
pH, Arterial: 7.241 — ABNORMAL LOW (ref 7.350–7.450)
pO2, Arterial: 88.9 mmHg (ref 83.0–108.0)

## 2020-04-01 LAB — GLUCOSE, CAPILLARY
Glucose-Capillary: 150 mg/dL — ABNORMAL HIGH (ref 70–99)
Glucose-Capillary: 176 mg/dL — ABNORMAL HIGH (ref 70–99)
Glucose-Capillary: 181 mg/dL — ABNORMAL HIGH (ref 70–99)
Glucose-Capillary: 185 mg/dL — ABNORMAL HIGH (ref 70–99)

## 2020-04-01 LAB — BASIC METABOLIC PANEL
Anion gap: 11 (ref 5–15)
BUN: 35 mg/dL — ABNORMAL HIGH (ref 6–20)
CO2: 21 mmol/L — ABNORMAL LOW (ref 22–32)
Calcium: 8.4 mg/dL — ABNORMAL LOW (ref 8.9–10.3)
Chloride: 103 mmol/L (ref 98–111)
Creatinine, Ser: 0.71 mg/dL (ref 0.61–1.24)
GFR, Estimated: >60 mL/min (ref >60)
Glucose, Bld: 169 mg/dL — ABNORMAL HIGH (ref 70–99)
Potassium: 5 mmol/L (ref 3.5–5.1)
Sodium: 135 mmol/L (ref 135–145)

## 2020-04-01 LAB — PHOSPHORUS: Phosphorus: 4.9 mg/dL — ABNORMAL HIGH (ref 2.5–4.6)

## 2020-04-01 LAB — CBC
HCT: 50.7 % (ref 39.0–52.0)
Hemoglobin: 17.6 g/dL — ABNORMAL HIGH (ref 13.0–17.0)
MCH: 30.1 pg (ref 26.0–34.0)
MCHC: 34.7 g/dL (ref 30.0–36.0)
MCV: 86.8 fL (ref 80.0–100.0)
Platelets: 433 10*3/uL — ABNORMAL HIGH (ref 150–400)
RBC: 5.84 MIL/uL — ABNORMAL HIGH (ref 4.22–5.81)
RDW: 11.9 % (ref 11.5–15.5)
WBC: 11.5 10*3/uL — ABNORMAL HIGH (ref 4.0–10.5)
nRBC: 0 % (ref 0.0–0.2)

## 2020-04-01 LAB — D-DIMER, QUANTITATIVE
D-Dimer, Quant: 1.29 ug/mL-FEU — ABNORMAL HIGH (ref 0.00–0.50)
D-Dimer, Quant: 1.69 ug/mL-FEU — ABNORMAL HIGH (ref 0.00–0.50)

## 2020-04-01 LAB — FERRITIN: Ferritin: 776 ng/mL — ABNORMAL HIGH (ref 24–336)

## 2020-04-01 LAB — HEPATIC FUNCTION PANEL
ALT: 60 U/L — ABNORMAL HIGH (ref 0–44)
AST: 28 U/L (ref 15–41)
Albumin: 3.4 g/dL — ABNORMAL LOW (ref 3.5–5.0)
Alkaline Phosphatase: 108 U/L (ref 38–126)
Bilirubin, Direct: 0.2 mg/dL (ref 0.0–0.2)
Indirect Bilirubin: 0.9 mg/dL (ref 0.3–0.9)
Total Bilirubin: 1.1 mg/dL (ref 0.3–1.2)
Total Protein: 6.7 g/dL (ref 6.5–8.1)

## 2020-04-01 LAB — MAGNESIUM: Magnesium: 2.7 mg/dL — ABNORMAL HIGH (ref 1.7–2.4)

## 2020-04-01 MED ORDER — ETOMIDATE 2 MG/ML IV SOLN
INTRAVENOUS | Status: AC
  Administered 2020-04-01: 20 mg
  Filled 2020-04-01: qty 20

## 2020-04-01 MED ORDER — TRAZODONE HCL 50 MG PO TABS
50.0000 mg | ORAL_TABLET | Freq: Every day | ORAL | Status: DC
  Filled 2020-04-01: qty 1

## 2020-04-01 MED ORDER — MIDAZOLAM 50MG/50ML (1MG/ML) PREMIX INFUSION
2.0000 mg/h | INTRAVENOUS | Status: DC
  Administered 2020-04-01: 2 mg/h via INTRAVENOUS
  Administered 2020-04-02: 4 mg/h via INTRAVENOUS
  Filled 2020-04-01: qty 50

## 2020-04-01 MED ORDER — FENTANYL CITRATE (PF) 100 MCG/2ML IJ SOLN
INTRAMUSCULAR | Status: AC
  Administered 2020-04-01: 200 ug
  Filled 2020-04-01: qty 2

## 2020-04-01 MED ORDER — VECURONIUM BROMIDE 10 MG IV SOLR
INTRAVENOUS | Status: AC
  Administered 2020-04-01: 10 mg via INTRAVENOUS
  Filled 2020-04-01: qty 10

## 2020-04-01 MED ORDER — VANCOMYCIN HCL IN DEXTROSE 1-5 GM/200ML-% IV SOLN
1000.0000 mg | Freq: Three times a day (TID) | INTRAVENOUS | Status: DC
  Administered 2020-04-02: 1000 mg via INTRAVENOUS
  Filled 2020-04-01: qty 200

## 2020-04-01 MED ORDER — FENTANYL BOLUS VIA INFUSION
50.0000 ug | INTRAVENOUS | Status: DC | PRN
  Filled 2020-04-01: qty 50

## 2020-04-01 MED ORDER — MIDAZOLAM BOLUS VIA INFUSION
1.0000 mg | INTRAVENOUS | Status: DC | PRN
  Administered 2020-04-02 – 2020-04-03 (×3): 2 mg via INTRAVENOUS
  Filled 2020-04-01: qty 2

## 2020-04-01 MED ORDER — VECURONIUM BROMIDE 10 MG IV SOLR: 10.0000 mg | Freq: Once | INTRAVENOUS | Status: AC

## 2020-04-01 MED ORDER — DOCUSATE SODIUM 50 MG/5ML PO LIQD
100.0000 mg | Freq: Two times a day (BID) | ORAL | Status: DC
  Administered 2020-04-02: 100 mg via ORAL
  Filled 2020-04-01: qty 10

## 2020-04-01 MED ORDER — ETOMIDATE 2 MG/ML IV SOLN
INTRAVENOUS | Status: AC
  Filled 2020-04-01: qty 10

## 2020-04-01 MED ORDER — LORAZEPAM 2 MG/ML IJ SOLN
0.2500 mg | Freq: Once | INTRAMUSCULAR | Status: AC | PRN
  Administered 2020-04-01: 0.25 mg via INTRAVENOUS

## 2020-04-01 MED ORDER — SODIUM CHLORIDE 0.9 % IV SOLN
2.0000 g | Freq: Three times a day (TID) | INTRAVENOUS | Status: DC
  Administered 2020-04-01 – 2020-04-04 (×8): 2 g via INTRAVENOUS
  Filled 2020-04-01 (×8): qty 2

## 2020-04-01 MED ORDER — MIDAZOLAM HCL 2 MG/2ML IJ SOLN
2.0000 mg | INTRAMUSCULAR | Status: AC | PRN
  Administered 2020-04-01 (×2): 2 mg via INTRAVENOUS
  Filled 2020-04-01 (×3): qty 2

## 2020-04-01 MED ORDER — ARTIFICIAL TEARS OPHTHALMIC OINT
1.0000 "application " | TOPICAL_OINTMENT | Freq: Three times a day (TID) | OPHTHALMIC | Status: DC
  Administered 2020-04-01 – 2020-04-05 (×11): 1 via OPHTHALMIC
  Filled 2020-04-01 (×2): qty 3.5

## 2020-04-01 MED ORDER — FUROSEMIDE 10 MG/ML IJ SOLN
40.0000 mg | Freq: Once | INTRAMUSCULAR | Status: AC
  Administered 2020-04-01: 40 mg via INTRAVENOUS
  Filled 2020-04-01: qty 4

## 2020-04-01 MED ORDER — POLYETHYLENE GLYCOL 3350 17 G PO PACK
17.0000 g | PACK | Freq: Every day | ORAL | Status: DC
  Administered 2020-04-02: 17 g via ORAL
  Filled 2020-04-01: qty 1

## 2020-04-01 MED ORDER — FENTANYL 2500MCG IN NS 250ML (10MCG/ML) PREMIX INFUSION
50.0000 ug/h | INTRAVENOUS | Status: DC
  Administered 2020-04-01: 50 ug/h via INTRAVENOUS
  Filled 2020-04-01 (×2): qty 250

## 2020-04-01 MED ORDER — ALBUTEROL SULFATE (2.5 MG/3ML) 0.083% IN NEBU
2.5000 mg | INHALATION_SOLUTION | Freq: Four times a day (QID) | RESPIRATORY_TRACT | Status: DC
  Administered 2020-04-02 – 2020-04-10 (×33): 2.5 mg via RESPIRATORY_TRACT
  Filled 2020-04-01 (×33): qty 3

## 2020-04-01 MED ORDER — FENTANYL CITRATE (PF) 100 MCG/2ML IJ SOLN
50.0000 ug | Freq: Once | INTRAMUSCULAR | Status: AC
  Administered 2020-04-01: 50 ug via INTRAVENOUS

## 2020-04-01 MED ORDER — MIDAZOLAM HCL 2 MG/2ML IJ SOLN
2.0000 mg | INTRAMUSCULAR | Status: DC | PRN
  Administered 2020-04-01 – 2020-04-02 (×2): 2 mg via INTRAVENOUS
  Filled 2020-04-01 (×3): qty 2

## 2020-04-01 MED ORDER — PROPOFOL 1000 MG/100ML IV EMUL
0.0000 ug/kg/min | INTRAVENOUS | Status: DC
  Administered 2020-04-01: 5 ug/kg/min via INTRAVENOUS
  Administered 2020-04-02: 15 ug/kg/min via INTRAVENOUS
  Filled 2020-04-01 (×2): qty 100

## 2020-04-01 MED ORDER — ROCURONIUM BROMIDE 10 MG/ML (PF) SYRINGE
PREFILLED_SYRINGE | INTRAVENOUS | Status: AC
  Administered 2020-04-01: 50 mg
  Filled 2020-04-01: qty 10

## 2020-04-01 MED ORDER — VANCOMYCIN HCL 1500 MG/300ML IV SOLN
1500.0000 mg | Freq: Once | INTRAVENOUS | Status: AC
  Administered 2020-04-02: 1500 mg via INTRAVENOUS
  Filled 2020-04-01: qty 300

## 2020-04-01 MED ORDER — FENTANYL BOLUS VIA INFUSION
50.0000 ug | INTRAVENOUS | Status: DC | PRN
  Administered 2020-04-01 (×2): 50 ug via INTRAVENOUS
  Administered 2020-04-01: 150 ug via INTRAVENOUS
  Administered 2020-04-01 – 2020-04-15 (×37): 50 ug via INTRAVENOUS
  Administered 2020-04-15: 100 ug via INTRAVENOUS
  Administered 2020-04-15 – 2020-04-16 (×3): 50 ug via INTRAVENOUS
  Filled 2020-04-01: qty 50

## 2020-04-01 MED ORDER — VECURONIUM BROMIDE 10 MG IV SOLR
INTRAVENOUS | Status: AC
  Filled 2020-04-01: qty 10

## 2020-04-01 MED ORDER — PANTOPRAZOLE SODIUM 40 MG IV SOLR
40.0000 mg | Freq: Every day | INTRAVENOUS | Status: DC
  Administered 2020-04-02: 40 mg via INTRAVENOUS
  Filled 2020-04-01: qty 40

## 2020-04-01 MED ORDER — MIDAZOLAM 50MG/50ML (1MG/ML) PREMIX INFUSION: 0.5000 mg/h | INTRAVENOUS | Status: DC

## 2020-04-01 MED ORDER — MIDAZOLAM HCL 2 MG/2ML IJ SOLN
4.0000 mg | Freq: Once | INTRAMUSCULAR | Status: AC
  Administered 2020-04-01: 4 mg via INTRAVENOUS

## 2020-04-01 MED ORDER — FENTANYL CITRATE (PF) 100 MCG/2ML IJ SOLN: 50.0000 ug | Freq: Once | INTRAMUSCULAR | Status: AC

## 2020-04-01 MED ORDER — VECURONIUM BOLUS VIA INFUSION: 10.0000 mg | Freq: Once | INTRAVENOUS | Status: DC

## 2020-04-01 MED ORDER — FENTANYL 2500MCG IN NS 250ML (10MCG/ML) PREMIX INFUSION
50.0000 ug/h | INTRAVENOUS | Status: DC
  Administered 2020-04-02: 250 ug/h via INTRAVENOUS
  Administered 2020-04-02: 200 ug/h via INTRAVENOUS
  Administered 2020-04-03 (×3): 300 ug/h via INTRAVENOUS
  Administered 2020-04-04: 325 ug/h via INTRAVENOUS
  Administered 2020-04-04: 300 ug/h via INTRAVENOUS
  Administered 2020-04-04 (×2): 400 ug/h via INTRAVENOUS
  Administered 2020-04-05: 275 ug/h via INTRAVENOUS
  Administered 2020-04-05: 300 ug/h via INTRAVENOUS
  Administered 2020-04-05: 250 ug/h via INTRAVENOUS
  Administered 2020-04-06 (×3): 400 ug/h via INTRAVENOUS
  Administered 2020-04-06: 250 ug/h via INTRAVENOUS
  Administered 2020-04-07: 400 ug/h via INTRAVENOUS
  Administered 2020-04-07 (×2): 300 ug/h via INTRAVENOUS
  Administered 2020-04-08: 250 ug/h via INTRAVENOUS
  Administered 2020-04-08: 400 ug/h via INTRAVENOUS
  Administered 2020-04-08: 200 ug/h via INTRAVENOUS
  Administered 2020-04-08: 300 ug/h via INTRAVENOUS
  Administered 2020-04-09 – 2020-04-10 (×5): 400 ug/h via INTRAVENOUS
  Administered 2020-04-10: 350 ug/h via INTRAVENOUS
  Administered 2020-04-10: 375 ug/h via INTRAVENOUS
  Administered 2020-04-10: 400 ug/h via INTRAVENOUS
  Administered 2020-04-11: 350 ug/h via INTRAVENOUS
  Administered 2020-04-11: 300 ug/h via INTRAVENOUS
  Administered 2020-04-11: 400 ug/h via INTRAVENOUS
  Administered 2020-04-12 (×2): 350 ug/h via INTRAVENOUS
  Administered 2020-04-12: 300 ug/h via INTRAVENOUS
  Administered 2020-04-12: 400 ug/h via INTRAVENOUS
  Administered 2020-04-13: 275 ug/h via INTRAVENOUS
  Administered 2020-04-13: 350 ug/h via INTRAVENOUS
  Administered 2020-04-13: 325 ug/h via INTRAVENOUS
  Administered 2020-04-14: 300 ug/h via INTRAVENOUS
  Administered 2020-04-14: 275 ug/h via INTRAVENOUS
  Administered 2020-04-14: 250 ug/h via INTRAVENOUS
  Administered 2020-04-15: 300 ug/h via INTRAVENOUS
  Administered 2020-04-15: 200 ug/h via INTRAVENOUS
  Filled 2020-04-01 (×42): qty 250

## 2020-04-01 MED ORDER — ROCURONIUM BROMIDE 10 MG/ML (PF) SYRINGE
PREFILLED_SYRINGE | INTRAVENOUS | Status: AC
  Filled 2020-04-01: qty 10

## 2020-04-01 MED ORDER — SENNOSIDES-DOCUSATE SODIUM 8.6-50 MG PO TABS: 2.0000 | ORAL_TABLET | Freq: Every day | ORAL | Status: DC

## 2020-04-01 MED ORDER — MIDAZOLAM HCL 2 MG/2ML IJ SOLN
INTRAMUSCULAR | Status: AC
  Administered 2020-04-01: 4 mg
  Filled 2020-04-01: qty 4

## 2020-04-01 MED ORDER — HYDROXYZINE HCL 25 MG PO TABS
25.0000 mg | ORAL_TABLET | Freq: Three times a day (TID) | ORAL | Status: DC | PRN
  Administered 2020-04-01: 25 mg via ORAL
  Filled 2020-04-01: qty 1

## 2020-04-01 NOTE — Progress Notes (Signed)
PROGRESS NOTE    Dennis Howe  AOZ:[id number] [name]: April 19, 1976 DOA: 03/25/2020 PCP: Pcp, No   Chief Complaint  Patient presents with  . Covid Positive   Brief Narrative:  44 year old gentleman with no significant medical history, not vaccinated against COVID-19 presented to the ER with intermittent substernal chest pain and shortness of breath worsening for 1 day, diagnosed with COVID-19 on 9/28 at outside facility.  He was also seen in the emergency room on 9/30 for nausea and vomiting and discharged home on conservative management.  Continue to have worsening symptoms so came back to the ER. In the emergency room, afebrile, tachypneic and tachycardic.  Initially requiring 2 L of oxygen.  Chest x-ray with bilateral consolidation.  Admitted with steroids and remdesivir. Patient initially admitted on 2 L oxygen, his oxygen requirement increased and currently on 15 L.  Assessment & Plan:   Principal Problem:   Acute hypoxemic respiratory failure due to COVID-19 [facility]) Active Problems:   Chest pain   Elevated LFTs  Acute hypoxemic respiratory failure due to COVID-19 virus pneumonia: CXR 10/6 with multifocal airspace opacity with overall increase in opacity bilaterally compared to recent study Currently requiring 40 L HHFNC and wearing NRB as well, satting in high 80's and low 90's at times Strict I/O, daily weights - net negative 7.1 L  Continue steroids.  Remdesivir (10/2-10/6).  Actemra 10/3, 10/6.  Supplemental oxygen to keep saturations more than 85 %. Prone as able, OOB, flutter, IS, therapy  PCCM consult, appreciate recommendations  COVID-19 Labs  Recent Labs    03/30/20 0436 03/31/20 0253 04/01/20 0525  DDIMER 1.48* 1.98* 1.29*  FERRITIN 799* 684* 776*  CRP 0.9 0.6  --     Lab Results  Component Value Date   SARSCOV2NAA POSITIVE (English Tomer) 03/25/2020   Possible Small R Apical Pneumothorax  Pneumomediastinum  Subcutaneous Emphysema:  Daily CXR continue to  follow, appreciate PCCM consult    Elevated D dimer: improving today.  likely 2/2 covid infection, related to inflammation.  Will follow LE [place] (negative for DVT).  Echo 10/3 with normal EF, no RWMA, RVSF normal.  Low threshold for therapeutic anticoagulation.  Prediabetes  Hyperglycemia 2/2 steroids: follow a1c (6.3), SSI  Elevated LFT's: 2/2 covid, continue to monitor  DVT prophylaxis: lovenox Code Status: full  Family Communication: wife 10/8 Disposition:   Status is: Inpatient  Remains inpatient appropriate because:Inpatient level of care appropriate due to severity of illness   Dispo: The patient is from: Home              Anticipated d/c is to: Home              Anticipated d/c date is: > 3 days              Patient currently is not medically stable to d/c.  Consultants:   none  Procedures:  Echo IMPRESSIONS    1. Left ventricular ejection fraction, by estimation, is 65 to 70%. The  left ventricle has normal function. The left ventricle has no regional  wall motion abnormalities. Left ventricular diastolic parameters were  normal.  2. Right ventricular systolic function is normal. The right ventricular  size is normal.  3. The mitral valve is normal in structure. No evidence of mitral valve  regurgitation. No evidence of mitral stenosis.  4. The aortic valve is tricuspid.  5. Limited echo to evaluate LV and RV function   LE [place] Summary:  BILATERAL:  - No evidence of  deep vein thrombosis seen in the lower extremities,  bilaterally.  -   Echo IMPRESSIONS    1. The inferior vena cava is normal in size with greater than 50%  respiratory variability, suggesting right atrial pressure of 3 mmHg.  2. I cannot see anything but the IVC and the right atrium on this echo.  Not an interpretable study.    Antimicrobials:  Anti-infectives (From admission, onward)   Start     Dose/Rate Route Frequency Ordered Stop   03/26/20 1000  remdesivir 100 mg in sodium  chloride 0.9 % 100 mL IVPB       "Followed by" Linked Group Details   100 mg 200 mL/hr over 30 Minutes Intravenous Daily 03/25/20 1249 03/29/20 0946   03/26/20 1000  remdesivir 100 mg in sodium chloride 0.9 % 100 mL IVPB  Status:  Discontinued       "Followed by" Linked Group Details   100 mg 200 mL/hr over 30 Minutes Intravenous Daily 03/25/20 1720 03/25/20 1730   03/25/20 1720  remdesivir 200 mg in sodium chloride 0.9% 250 mL IVPB  Status:  Discontinued       "Followed by" Linked Group Details   200 mg 580 mL/hr over 30 Minutes Intravenous Once 03/25/20 1720 03/25/20 1730   03/25/20 1500  remdesivir 200 mg in sodium chloride 0.9% 250 mL IVPB       "Followed by" Linked Group Details   200 mg 580 mL/hr over 30 Minutes Intravenous Once 03/25/20 1249 03/25/20 1702     Subjective: Interpreter used (pt did not use, but interpreter was available during conversation) No new complaints, no pain  Objective: Vitals:   04/01/20 0900 04/01/20 1000 04/01/20 1100 04/01/20 1200  BP: (!) 143/88 136/77 126/90 131/85  Pulse: 86 91 83 94  Resp: (!) 31 (!) 21 (!) 34 (!) 32  Temp:   97.6 F (36.4 C)   TempSrc:   Axillary   SpO2: (!) 89% 90% 91% (!) 89%  Weight:      Height:        Intake/Output Summary (Last 24 hours) at 04/01/2020 1257 Last data filed at 04/01/2020 1100 Gross per 24 hour  Intake 320 ml  Output 1100 ml  Net -780 ml   Filed Weights   03/30/20 0500 03/31/20 0500 04/01/20 0500  Weight: 99.1 kg 99.1 kg 86.9 kg    Examination:  General: No acute distress. Cardiovascular: Heart sounds show Stefhanie Kachmar regular rate, and rhythm Lungs: diminished, tachypneic Abdomen: Soft, nontender, nondistended  Neurological: Alert and oriented 3. Moves all extremities 4. Cranial nerves II through XII grossly intact. Skin: Warm and dry. No rashes or lesions. Extremities: No clubbing or cyanosis. No edema.   Data Reviewed: I have personally reviewed following labs and imaging  studies  CBC: Recent Labs  Lab 03/27/20 0341 03/27/20 0341 03/28/20 0522 03/29/20 0440 03/30/20 0436 03/31/20 0253 04/01/20 0525  WBC 10.9*   < > 11.5* 12.6* 9.8 10.4 11.5*  NEUTROABS 9.8*  --  10.4* 11.5* 8.6* 9.3*  --   HGB 14.6   < > 15.5 15.3 15.9 17.0 17.6*  HCT 42.7   < > 44.9 44.9 47.0 49.7 50.7  MCV 87.7   < > 88.0 88.2 88.8 87.7 86.8  PLT 270   < > 307 325 390 423* 433*   < > = values in this interval not displayed.    Basic Metabolic Panel: Recent Labs  Lab 03/28/20 0522 03/29/20 0440 03/30/20 7793 03/31/20 0253 04/01/20 9030  NA 137 134* 137 136 135  K 4.6 4.7 4.8 4.9 5.0  CL 101 103 104 102 103  CO2 24 21* 22 20* 21*  GLUCOSE 169* 165* 146* 173* 169*  BUN 28* 26* 25* 32* 35*  CREATININE 0.79 0.68 0.70 0.72 0.71  CALCIUM 8.0* 8.0* 8.6* 8.5* 8.4*  MG  --   --   --  2.6* 2.7*  PHOS  --   --   --  4.6 4.9*    GFR: Estimated Creatinine Clearance: 128.1 mL/min (by C-G formula based on SCr of 0.71 mg/dL).  Liver Function Tests: Recent Labs  Lab 03/28/20 0522 03/29/20 0440 03/30/20 0436 03/31/20 0253 04/01/20 0525  AST 41 ALT 130* 95* 75* 70* 60*  ALKPHOS 129* 109 107 118 108  BILITOT 0.7 0.8 0.8 1.0 1.1  PROT 6.8 6.7 6.6 7.2 6.7  ALBUMIN 3.0* 3.0* 3.0* 3.3* 3.4*    CBG: Recent Labs  Lab 03/31/20 1205 03/31/20 1657 03/31/20 2152 04/01/20 0754 04/01/20 1058  GLUCAP 145* 170* 173* 150* 185*     Recent Results (from the past 240 hour(s))  Respiratory Panel by RT PCR (Flu Malikhi Ogan&B, Covid) - Nasopharyngeal Swab     Status: Abnormal   Collection Time: 03/25/20  1:10 PM   Specimen: Nasopharyngeal Swab  Result Value Ref Range Status   SARS Coronavirus 2 by RT PCR POSITIVE (Brita Jurgensen) NEGATIVE Final    Comment: RESULT CALLED TO, READ BACK BY AND VERIFIED WITH: M.QUICK,RN [id number]  BY V.WILKINS (NOTE) SARS-CoV-2 target nucleic acids are DETECTED.  SARS-CoV-2 RNA is generally detectable in upper respiratory specimens  during the acute  phase of infection. Positive results are indicative of the presence of the identified virus, but do not rule out bacterial infection or co-infection with other pathogens not detected by the test. Clinical correlation with patient history and other diagnostic information is necessary to determine patient infection status. The expected result is Negative.  Fact Sheet for Patients:  https://www.moore.com/  Fact Sheet for Healthcare Providers: https://www.young.biz/  This test is not yet approved or cleared by the [place] FDA and  has been authorized for detection and/or diagnosis of SARS-CoV-2 by FDA under an Emergency Use Authorization (EUA).  This EUA will remain in effect (meaning this test can b e used) for the duration of  the COVID-19 declaration under Section 564(b)(1) of the Act, 21 U.S.C. section 360bbb-3(b)(1), unless the authorization is terminated or revoked sooner.      Influenza Khiara Shuping by PCR NEGATIVE NEGATIVE Final   Influenza B by PCR NEGATIVE NEGATIVE Final    Comment: (NOTE) The Xpert Xpress SARS-CoV-2/FLU/RSV assay is intended as an aid in  the diagnosis of influenza from Nasopharyngeal swab specimens and  should not be used as Burris Matherne sole basis for treatment. Nasal washings and  aspirates are unacceptable for Xpert Xpress SARS-CoV-2/FLU/RSV  testing.  Fact Sheet for Patients: https://www.moore.com/  Fact Sheet for Healthcare Providers: https://www.young.biz/  This test is not yet approved or cleared by the [place] FDA and  has been authorized for detection and/or diagnosis of SARS-CoV-2 by  FDA under an Emergency Use Authorization (EUA). This EUA will remain  in effect (meaning this test can be used) for the duration of the  Covid-19 declaration under Section 564(b)(1) of the Act, 21  U.S.C. section 360bbb-3(b)(1), unless the authorization is  terminated or revoked. Performed at  [facility], [address] [place], [place] [address]   Blood Culture (routine  x 2)     Status: None   Collection Time: 03/25/20  1:10 PM   Specimen: BLOOD  Result Value Ref Range Status   Specimen Description   Final    BLOOD RIGHT ANTECUBITAL Performed at [facility], [address] [place], [place] [address]    Special Requests   Final    BOTTLES DRAWN AEROBIC AND ANAEROBIC Blood Culture adequate volume Performed at [facility], [address] [place], [place] [address]    Culture   Final    NO GROWTH 5 DAYS Performed at [facility], [address] [place], [place] [address]    Report Status 03/30/2020 FINAL  Final  Blood Culture (routine x 2)     Status: None   Collection Time: 03/25/20  1:10 PM   Specimen: BLOOD LEFT HAND  Result Value Ref Range Status   Specimen Description   Final    BLOOD LEFT HAND Performed at [facility], [address], [place], [place] [address]    Special Requests   Final    BOTTLES DRAWN AEROBIC AND ANAEROBIC Blood Culture adequate volume Performed at [facility], [address], [place], [place] [address]    Culture   Final    NO GROWTH 5 DAYS Performed at [facility], [address], [place], [place] [address]    Report Status 03/30/2020 FINAL  Final  MRSA PCR Screening     Status: None   Collection Time: 03/30/20  6:50 PM   Specimen: Nasal Mucosa; Nasopharyngeal  Result Value Ref Range Status   MRSA by PCR NEGATIVE NEGATIVE Final    Comment:        The GeneXpert MRSA Assay (FDA approved for NASAL specimens only), is one component of Colm Lyford comprehensive MRSA colonization surveillance program. It is not intended to diagnose MRSA infection nor to guide or monitor treatment for MRSA infections. Performed at [facility], [address] [place], [place] [address]          Radiology Studies: [facility] Chest Port 1  View  Result Date: 04/01/2020 CLINICAL DATA:  Acute respiratory failure due to COVID 19 infection. EXAM: PORTABLE CHEST 1 VIEW COMPARISON:  03/31/2020; 03/29/2020; 03/25/2020; 03/23/2020 FINDINGS: Grossly unchanged cardiac silhouette and mediastinal contours. Unchanged trace right apical pneumothorax, pneumomediastinum and subcutaneous emphysema overlying the base of the neck. Redemonstrated extensive bilateral interstitial and airspace opacities with relative area of confluence within the peripheral aspect the left mid lung. No new focal airspace opacities. No definite pleural effusion. No definite evidence of edema. No acute osseous abnormalities. IMPRESSION: 1. Similar appearing extensive interstitial and airspace opacities with relative area of confluence with peripheral aspect the left lung compatible provided history of COVID-19 infection. 2. Grossly unchanged trace right apical pneumothorax, pneumomediastinum and subcutaneous emphysema. Electronically Signed   By: Simonne Come M.D.   On: 04/01/2020 11:06   DG CHEST PORT 1 VIEW  Result Date: 03/31/2020 CLINICAL DATA:  Right pneumothorax EXAM: PORTABLE CHEST 1 VIEW COMPARISON:  Radiograph 03/31/2020 FINDINGS: Mixed consolidative and patchy opacities again seen throughout both lungs. There is Tevita Gomer residual visceral pleural line in the right lung apex suggesting Jewelene Mairena right apical pneumothorax. Not significantly changed from prior. Additional mediastinal gas is seen extending along the left paramediastinal border with extensive subcutaneous emphysema of the chest wall at the base of the neck. This is also unchanged from comparison. Remaining cardiomediastinal contours are unremarkable. Telemetry leads overlie the  chest. No acute osseous abnormality. IMPRESSION: No significant interval change in size of Manjit Bufano small right apical pneumothorax with extensive pneumomediastinum and subcutaneous emphysema. Widespread airspace disease bilaterally, left greater than right is  also unchanged from prior. Electronically Signed   By: Kreg ShropshirePrice  DeHay M.D.   On: 03/31/2020 16:00   DG CHEST PORT 1 VIEW  Result Date: 03/31/2020 CLINICAL DATA:  Hypoxia, COVID-19 positive EXAM: PORTABLE CHEST 1 VIEW COMPARISON:  03/29/2020 FINDINGS: Stable cardiomediastinal contours. Diffuse airspace opacities throughout both lungs with improved aeration of the right upper lobe. Interval development of subcutaneous emphysema within the soft tissues of the neck, right greater than left. Possible small right apical pneumothorax. IMPRESSION: 1. Possible small right apical pneumothorax. Findings could be artifactual related to chest wall subcutaneous emphysema or skin fold. Recommend repeat upright chest x-ray after patient repositioning. 2. Interval development of subcutaneous emphysema within the soft tissues of the neck, right greater than left. 3. Improved aeration of the right upper lobe. These results will be called to the ordering clinician or representative by the Radiologist Assistant, and communication documented in the PACS or [employer] Dashboard. Electronically Signed   By: Duanne GuessNicholas  Plundo D.O.   On: 03/31/2020 08:19   ECHOCARDIOGRAM COMPLETE  Result Date: 03/30/2020    ECHOCARDIOGRAM REPORT   Patient Name:   Alphonse GuildJUAN Drianna Chandran MANCERA Howe Date of Exam: 03/30/2020 Medical Rec #:  [id number]             Height:       71.0 in Accession #:    [id number]            Weight:       218.5 lb Date of Birth:  1975-07-24             BSA:          2.189 m Patient Age:    44 years              BP:           109/82 mmHg Patient Gender: M                     HR:           85 bpm. Exam Location:  Inpatient Procedure: 2D Echo, Color Doppler and Cardiac Doppler Indications:    Positive D Dimer. [id number]  History:        Patient has no prior history of Echocardiogram examinations.                 COVID-19 Positive.  Sonographer:    Tiffany Dance Referring Phys: [id number] Marek Nghiem CALDWELL POWELL JR  Sonographer Comments: Technically difficult  study due to poor echo windows, no parasternal window, no apical window and suboptimal subcostal window. IMPRESSIONS  1. The inferior vena cava is normal in size with greater than 50% respiratory variability, suggesting right atrial pressure of 3 mmHg.  2. I cannot see anything but the IVC and the right atrium on this echo. Not an interpretable study. FINDINGS  Left Ventricle: Left ventricular ejection fraction, by estimation, is 60 to 65%. The left ventricle has normal function. Left ventricular endocardial border not optimally defined to evaluate regional wall motion. The left ventricular internal cavity size was normal in size. There is no left ventricular hypertrophy. Left ventricular diastolic function could not be evaluated. Right Ventricle: The right ventricular size is not well visualized. Right vetricular wall thickness was not well visualized. Right ventricular systolic function was not well  visualized. Left Atrium: Left atrial size was not well visualized. Right Atrium: Right atrial size was not well visualized. Pericardium: The pericardium was not well visualized. Mitral Valve: The mitral valve was not well visualized. No evidence of mitral valve regurgitation. Tricuspid Valve: The tricuspid valve is not well visualized. Tricuspid valve regurgitation is not demonstrated. Aortic Valve: The aortic valve was not well visualized. Aortic valve regurgitation is not visualized. Pulmonic Valve: The pulmonic valve was not well visualized. Pulmonic valve regurgitation is not visualized. Aorta: The aortic root was not well visualized. Venous: The inferior vena cava is normal in size with greater than 50% respiratory variability, suggesting right atrial pressure of 3 mmHg. IAS/Shunts: The interatrial septum was not assessed.  IVC IVC diam: 1.50 cm Marca Ancona MD Electronically signed by Marca Ancona MD Signature Date/Time: 03/30/2020/5:13:38 PM    Final         Scheduled Meds: . albuterol  2 puff  Inhalation Q4H  . vitamin C  500 mg Oral Daily  . chlorhexidine  15 mL Mouth Rinse BID  . Chlorhexidine Gluconate Cloth  6 each Topical Daily  . enoxaparin (LOVENOX) injection  40 mg Subcutaneous Q24H  . insulin aspart  0-9 Units Subcutaneous TID WC  . mouth rinse  15 mL Mouth Rinse q12n4p  . methylPREDNISolone (SOLU-MEDROL) injection  50 mg Intravenous Q6H  . [START ON 04/03/2020] methylPREDNISolone (SOLU-MEDROL) injection  50 mg Intravenous Q12H  . polyethylene glycol  17 g Oral BID  . zinc sulfate  220 mg Oral Daily   Continuous Infusions:    LOS: 7 days    Time spent: over 30 min 37 min critical care time with [facility] 2/2 COVID pneumonia on 40 L HHFNC + NRB    Lacretia Nicks, MD [facility]   To contact the attending provider between 7A-7P or the covering provider during after hours 7P-7A, please log into the web site [email or web address] and access using universal  password for that web site. If you do not have the password, please call the hospital operator.  04/01/2020, 12:57 PM

## 2020-04-01 NOTE — Progress Notes (Signed)
Pt became increasingly anxious, diaphoretic, and tachycardic, with increased WOB around 1715. Despite gradual titration of heated HFNC with NRBM, patient remained labored with O2 sats remaining high 70's to low 80s. Dr. Lowell Guitar paged and placed a one time order for ativan. Charge RN also assessed pt.  When pt remained labored and anxious, Dr. Lowell Guitar came to see pt at bedside. Pt is now up to 70L heated HFNC with NRBM. STAT CXR also ordered. CCM also notified by Dr. Lowell Guitar of change in oxygenation status. Awaiting further orders at this time. O2 sat currently 82%, with RR of 33. Dr. Lowell Guitar is updating patient's wife over the phone.

## 2020-04-01 NOTE — Progress Notes (Addendum)
Pt c/o dull chest pain with cough. Pt denies persistent or worsening pain. No increased SOB reported by pt. Pt remains in NSR in the monitor. Dr. Lowell Guitar notified by this RN; orders to continue to monitor. Will also trend CXR per orders. PRN cough syrup given; pt experienced some relief.

## 2020-04-01 NOTE — Procedures (Signed)
Central Venous Catheter Insertion Procedure Note  Dennis Howe  [id number]  [birth year]  Date:04/01/20  Time:9:00 PM   Provider Performing:Eutimio Gharibian, Victorino Dike   Procedure: Insertion of Non-tunneled Central Venous Catheter(36556) with US guidance (56213)   Indication(s) Medication administration  Consent Unable to obtain consent due to emergent nature of procedure.  Anesthesia Topical only with 1% lidocaine   Timeout Verified patient identification, verified procedure, site/side was marked, verified correct patient position, special equipment/implants available, medications/allergies/relevant history reviewed, required imaging and test results available.  Sterile Technique Maximal sterile technique including full sterile barrier drape, hand hygiene, sterile gown, sterile gloves, mask, hair covering, sterile ultrasound probe cover (if used).  Procedure Description Area of catheter insertion was cleaned with chlorhexidine and draped in sterile fashion.  With real-time ultrasound guidance a central venous catheter was placed into the right internal jugular vein. Nonpulsatile blood flow and easy flushing noted in all ports.  The catheter was sutured in place and sterile dressing applied.  Complications/Tolerance None; patient tolerated the procedure well. Chest X-ray is ordered to verify placement for internal jugular cannulation.    EBL Minimal  Specimen(s) None

## 2020-04-01 NOTE — Progress Notes (Signed)
eLink Physician-Brief Progress Note Patient Name: Adolph Clutter [name]: [birth year] [name]: [id number]   Date of Service  04/01/2020  HPI/Events of Note  Recently intubated for acute hypoxemic respiratory failure, patient has subcutaneous emphysema and possible subtle apical pneumothorax, now with marked ventilator dyssynchrony.  eICU Interventions  Patient paralyzed and sedated per ventilator muscle relaxant protocol, RR increased to 35 for hypercarbia and respiratory acidosis, stat portable CXR ordered to exclude pneumothorax .        Thomasene Lot Zylen Wenig 04/01/2020, 10:28 PM

## 2020-04-01 NOTE — Progress Notes (Addendum)
Vitals:   04/01/20 1800 04/01/20 1813  BP:  (!) 158/109  Pulse: (!) 110 (!) 104  Resp: (!) 38 (!) 45  Temp:    SpO2:     SpO2: (!) 84 % O2 Flow Rate (L/min): 60 L/min (plus 15L NRBM) FiO2 (%): 100 %  Pt has had recurrent anxiety throughout day, suspected 2/2 hypoxia.  Initially trial hydroxyzine.  Came to bedside this afternoon, pt noted pleuritic chest discomfort and anxiety today.  Low dose ativan ordered x1.  Stat CXR ordered. pt appeared to be decompensating prior to this being done (with saturations falling to the 60's) and it was canceled.  Repeat CXR.  Pt now on max oxygen support on HHFNC + NRB satting in high 70s and low 80s.  Follow repeat d dimer.  Discussion with Dr. Vassie Loll.  He's at high risk for intubation.  Discussed with pt wife and updated.  Abx for HCAP ordered.  Dose of lasix.  Plan to intubate.  Updating wife.

## 2020-04-01 NOTE — Procedures (Signed)
Intubation Procedure Note  Dennis Howe  [id number]  Sep 13, 1975  Date:04/01/20  Time:7:27 PM   Provider Performing:Marlisha Vanwyk V. Marai Teehan    Procedure: Intubation (31500)  Indication(s) Respiratory Failure  Consent Risks of the procedure as well as the alternatives and risks of each were explained to the patient and/or caregiver.  Consent for the procedure was obtained and is signed in the bedside chart   Anesthesia Etomidate, Versed, Fentanyl and Rocuronium   Time Out Verified patient identification, verified procedure, site/side was marked, verified correct patient position, special equipment/implants available, medications/allergies/relevant history reviewed, required imaging and test results available.   Sterile Technique Usual hand hygeine, masks, and gloves were used   Versed 4  fentanyl 200 in divided doses Etomidate 20 Roc 50   Procedure Description Patient positioned in bed supine.  Sedation given as noted above.  Patient was intubated with endotracheal tube using Glidescope.  View was Grade 1 full glottis .  Number of attempts was 1.  Colorimetric CO2 detector was consistent with tracheal placement.   Complications/Tolerance None; patient tolerated the procedure well. Chest X-ray is ordered to verify placement.   EBL Minimal   Specimen(s) None   Dennis Howe V. Vassie Loll MD

## 2020-04-01 NOTE — Progress Notes (Signed)
Pt was intubated by Dr. Vassie Loll around [phone]. Pt spoke to his wife over the phone prior to procedure. All questions from patient and family answered by CCM and triad. 4 mg versed, 200 mcg fentanyl, 20 mg Etomidate, and 50 mg Rocuronium given. Pt tolerated procedure well. Awaiting results of CXR to confirm tube placement. CO2 detector was consistent with tracheal placement. Pt is currently on 100% FiO2; O2 sat 99%. Nightshift RN will continue care for patient.

## 2020-04-01 NOTE — Progress Notes (Signed)
Pharmacy Antibiotic Note  Dennis Howe Dennis Howe is a 44 y.o. male admitted on 03/25/2020.  Pharmacy has been consulted for Vancomycin and Cefepime dosing for pneumonia  Plan: Cefepime 2g IV q8h Vancomycin 1500 mg IV x1 then 1g IV q8h. Follow up renal function, culture results, and clinical course.   Height: 5\' 11"  (180.3 cm) Weight: 86.9 kg (191 lb 9.3 oz) IBW/kg (Calculated) : 75.3  Temp (24hrs), Avg:98.3 F (36.8 C), Min:97.6 F (36.4 C), Max:99.5 F (37.5 C)  Recent Labs  Lab 03/28/20 0522 03/29/20 0440 03/30/20 0436 03/31/20 0253 04/01/20 0525  WBC 11.5* 12.6* 9.8 10.4 11.5*  CREATININE 0.79 0.68 0.70 0.72 0.71    Estimated Creatinine Clearance: 128.1 mL/min (by C-G formula based on SCr of 0.71 mg/dL).    No Known Allergies   Antimicrobials this admission:  10/2 Remdesivir >> 10/6 10/3 Actemra x1 10/6 Actemra x1 (second dose) 10/9 Vancomycin >>  10/9 Cefepime >>   Dose adjustments this admission:   Microbiology results:  10/2 Influenza A/B: neg; Covid positive 10/2 BC: NGF 10/7 MRSA PCR: negative    Thank you for allowing pharmacy to be a part of this patient's care.  12/7 PharmD, BCPS Clinical Pharmacist [facility] main pharmacy [phone] 04/01/2020 7:37 PM

## 2020-04-01 NOTE — Progress Notes (Signed)
Pt expressing feelings of anxiety to this RN after weaning trial with HHFNC this AM. He reports waking up and panicking about breathing and oxygenation level. O2 sat remains stable, 91% on 40L Heated HFNC with NRBM. No respiratory distress noted upon assessment. This RN provided pt with reassurance and gave pt a heat pack for comfort. Dr. Lowell Guitar paged, and added PRN vistaril for anxiety. Pt is currently calm/resting. This RN will continue to carefully monitor pt.

## 2020-04-01 NOTE — Progress Notes (Signed)
eLink Physician-Brief Progress Note Patient Name: Dennis Howe [name]: 09-Jul-1975 [name]: [id number]   Date of Service  04/01/2020  HPI/Events of Note  Bedside RN unable to place OG tube.  eICU Interventions  Bedside RN instructed to ask a colleague with more experience to try.        Thomasene Lot Jaine Estabrooks 04/01/2020, 9:44 PM

## 2020-04-01 NOTE — Consult Note (Signed)
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  Evaluated in ED 9/30 for nausea/vomiting.  He presented with substernal chest pain and shortness of breath on 10/2 Initially requiring 2 L of oxygen.  Was treated with steroids and remdesivir .  Developed progressive multifocal bilateral infiltrates and hypoxia, requiring high flow oxygen.  Received Actemra 10/3 & 10/6 .  Due to rising D-dimer lower extremity ultrasound and echo was obtained.  PCCM consulted 10/8 Past Medical History  None  Significant Hospital Events   10/2 hospital admission   Consults:    Procedures:    Significant Diagnostic Tests:  Chest x-ray 10/8 subcutaneous air in neck,?  Right apical pneumothorax 10/7 venous duplex negative BLE  10/3 echo [id number]  Micro Data:  10/2 Bc >> ng  Antimicrobials:     Interim history/subjective:   Remains critically ill on high flow oxygen On 40 L, 90% and nonrebreather in addition. Lying on his left side  Objective   Blood pressure (!) 143/88, pulse 86, temperature 97.6 F (36.4 C), temperature source Axillary, resp. rate (!) 31, height 5\' 11"  (1.803 m), weight 86.9 kg, SpO2 (!) 89 %.    FiO2 (%):  [90 %-100 %] 90 %   Intake/Output Summary (Last 24 hours) at 04/01/2020 1036 Last data filed at 04/01/2020 0000 Gross per 24 hour  Intake 100 ml  Output 1100 ml  Net -1000 ml   Filed Weights   03/30/20 0500 03/31/20 0500 04/01/20 0500  Weight: 99.1 kg 99.1 kg 86.9 kg    Examination: General: Middle-age man,  left lateral decubitus position, anxious affect HENT: Mild pallor, no icterus, no JVD Lungs: Mild accessory muscle use, crepitus over neck, no chest wall crepitus, decreased breath sounds bilateral Cardiovascular: S1-S2 regular Abdomen: Soft nontender abdomen Extremities: No edema Neuro: Alert, oriented, nonfocal   Chest x-ray 10/9 independently reviewed , right apical pneumothorax versus artifact, unchanged from 10/8, no worsening of subcu emphysema  Resolved Hospital Problem list     Assessment & Plan:  Acute hypoxic respiratory failure due to COVID pneumonia  -Remains tenuous on high flow nasal cannula , I attempted to drop him to 30 L to decrease PEEP but he had a desaturation event and we had to increase back up to 40 L -He remains high risk for mechanical ventilation -Has completed remdesivir, continue steroids Has received Actemra  ?  Right apical pneumothorax noted on chest x-ray 10/8 There is subcutaneous emphysema in his neck and evidence of pneumomediastinum on chest x-ray -No worsening in last 24 hours, follow serial imaging daily, if pneumothorax definitely present or worse, will place chest tube   Best practice:  Diet: Clear liquid Pain/Anxiety/Delirium protocol (if indicated): N/A VAP protocol (if indicated): N/A DVT prophylaxis: Lovenox GI prophylaxis: N/A Glucose control: SSI while on steroids Mobility: Bedrest with self proning as tolerated Code Status: Full Family Communication: Per primary Disposition: ICU  Labs   CBC: Recent Labs  Lab 03/27/20 0341 03/27/20 0341 03/28/20 0522 03/29/20 0440 03/30/20 0436 03/31/20 0253 04/01/20 0525  WBC 10.9*   < > 11.5*  12.6* 9.8 10.4 11.5*  NEUTROABS 9.8*  --  10.4* 11.5* 8.6* 9.3*  --   HGB 14.6   < > 15.5 15.3 15.9 17.0 17.6*  HCT 42.7   < > 44.9 44.9 47.0 49.7 50.7  MCV 87.7   < > 88.0 88.2 88.8 87.7 86.8  PLT 270   < > 307 325 390 423* 433*   < > = values in this interval not displayed.    Basic  Metabolic Panel: Recent Labs  Lab 03/28/20 0522 03/29/20 0440 03/30/20 0436 03/31/20 0253 04/01/20 0525  NA 137 134* 137 136 135  K 4.6 4.7 4.8 4.9 5.0  CL 101 103 104 102 103  CO2 24 21* 22 20* 21*  GLUCOSE 169* 165* 146* 173* 169*  BUN 28* 26* 25* 32* 35*  CREATININE 0.79 0.68 0.70 0.72 0.71  CALCIUM 8.0* 8.0* 8.6* 8.5* 8.4*  MG  --   --   --  2.6* 2.7*  PHOS  --   --   --  4.6 4.9*   GFR: Estimated Creatinine Clearance: 128.1 mL/min (by C-G formula based on SCr of 0.71 mg/dL). Recent Labs  Lab 03/25/20 1310 03/26/20 0406 03/29/20 0440 03/30/20 0436 03/31/20 0253 04/01/20 0525  PROCALCITON 0.10  --   --   --   --   --   WBC 5.3   < > 12.6* 9.8 10.4 11.5*  LATICACIDVEN 1.2  --   --   --   --   --    < > = values in this interval not displayed.         Cyril Mourning MD. Tonny Bollman. [facility] See Amion for pager  If no response to pager , please call [phone]  After 7:00 pm call Elink  [phone]   04/01/2020

## 2020-04-01 NOTE — Progress Notes (Signed)
1.75 mg ativan wasted with Gayland Curry, RN.

## 2020-04-02 ENCOUNTER — Inpatient Hospital Stay (HOSPITAL_COMMUNITY): Payer: 59

## 2020-04-02 DIAGNOSIS — J80 Acute respiratory distress syndrome: Secondary | ICD-10-CM | POA: Diagnosis not present

## 2020-04-02 DIAGNOSIS — J189 Pneumonia, unspecified organism: Secondary | ICD-10-CM | POA: Diagnosis not present

## 2020-04-02 DIAGNOSIS — J9601 Acute respiratory failure with hypoxia: Secondary | ICD-10-CM | POA: Diagnosis not present

## 2020-04-02 DIAGNOSIS — J939 Pneumothorax, unspecified: Secondary | ICD-10-CM

## 2020-04-02 DIAGNOSIS — U071 COVID-19: Secondary | ICD-10-CM | POA: Diagnosis not present

## 2020-04-02 DIAGNOSIS — J9312 Secondary spontaneous pneumothorax: Secondary | ICD-10-CM | POA: Diagnosis not present

## 2020-04-02 LAB — CBC WITH DIFFERENTIAL/PLATELET
Abs Immature Granulocytes: 0.16 10*3/uL — ABNORMAL HIGH (ref 0.00–0.07)
Basophils Absolute: 0.1 10*3/uL (ref 0.0–0.1)
Basophils Relative: 1 %
Eosinophils Absolute: 0 10*3/uL (ref 0.0–0.5)
Eosinophils Relative: 0 %
HCT: 55.5 % — ABNORMAL HIGH (ref 39.0–52.0)
Hemoglobin: 18.6 g/dL — ABNORMAL HIGH (ref 13.0–17.0)
Immature Granulocytes: 1 %
Lymphocytes Relative: 3 %
Lymphs Abs: 0.5 10*3/uL — ABNORMAL LOW (ref 0.7–4.0)
MCH: 29.9 pg (ref 26.0–34.0)
MCHC: 33.5 g/dL (ref 30.0–36.0)
MCV: 89.2 fL (ref 80.0–100.0)
Monocytes Absolute: 0.4 10*3/uL (ref 0.1–1.0)
Monocytes Relative: 2 %
Neutro Abs: 16.4 10*3/uL — ABNORMAL HIGH (ref 1.7–7.7)
Neutrophils Relative %: 93 %
Platelets: 316 10*3/uL (ref 150–400)
RBC: 6.22 MIL/uL — ABNORMAL HIGH (ref 4.22–5.81)
RDW: 12.2 % (ref 11.5–15.5)
WBC: 17.6 10*3/uL — ABNORMAL HIGH (ref 4.0–10.5)
nRBC: 0 % (ref 0.0–0.2)

## 2020-04-02 LAB — BLOOD GAS, ARTERIAL
Acid-base deficit: 4.9 mmol/L — ABNORMAL HIGH (ref 0.0–2.0)
Bicarbonate: 22.3 mmol/L (ref 20.0–28.0)
Drawn by: 308601
FIO2: 100
MECHVT: 450 mL
O2 Saturation: 93 %
PEEP: 14 cmH2O
Patient temperature: 98.1
RATE: 35 resp/min
pCO2 arterial: 48.8 mmHg — ABNORMAL HIGH (ref 32.0–48.0)
pH, Arterial: 7.279 — ABNORMAL LOW (ref 7.350–7.450)
pO2, Arterial: 80.2 mmHg — ABNORMAL LOW (ref 83.0–108.0)

## 2020-04-02 LAB — TRIGLYCERIDES: Triglycerides: 485 mg/dL — ABNORMAL HIGH (ref <150)

## 2020-04-02 LAB — COMPREHENSIVE METABOLIC PANEL
ALT: 51 U/L — ABNORMAL HIGH (ref 0–44)
AST: 19 U/L (ref 15–41)
Albumin: 3.4 g/dL — ABNORMAL LOW (ref 3.5–5.0)
Alkaline Phosphatase: 108 U/L (ref 38–126)
Anion gap: 12 (ref 5–15)
BUN: 39 mg/dL — ABNORMAL HIGH (ref 6–20)
CO2: 23 mmol/L (ref 22–32)
Calcium: 8.2 mg/dL — ABNORMAL LOW (ref 8.9–10.3)
Chloride: 99 mmol/L (ref 98–111)
Creatinine, Ser: 1.1 mg/dL (ref 0.61–1.24)
GFR, Estimated: >60 mL/min (ref >60)
Glucose, Bld: 178 mg/dL — ABNORMAL HIGH (ref 70–99)
Potassium: 5.9 mmol/L — ABNORMAL HIGH (ref 3.5–5.1)
Sodium: 134 mmol/L — ABNORMAL LOW (ref 135–145)
Total Bilirubin: 1.4 mg/dL — ABNORMAL HIGH (ref 0.3–1.2)
Total Protein: 7 g/dL (ref 6.5–8.1)

## 2020-04-02 LAB — GLUCOSE, CAPILLARY
Glucose-Capillary: 182 mg/dL — ABNORMAL HIGH (ref 70–99)
Glucose-Capillary: 196 mg/dL — ABNORMAL HIGH (ref 70–99)
Glucose-Capillary: 191 mg/dL — ABNORMAL HIGH (ref 70–99)
Glucose-Capillary: 208 mg/dL — ABNORMAL HIGH (ref 70–99)
Glucose-Capillary: 199 mg/dL — ABNORMAL HIGH (ref 70–99)
Glucose-Capillary: 186 mg/dL — ABNORMAL HIGH (ref 70–99)

## 2020-04-02 LAB — D-DIMER, QUANTITATIVE: D-Dimer, Quant: 3.7 ug/mL-FEU — ABNORMAL HIGH (ref 0.00–0.50)

## 2020-04-02 LAB — BASIC METABOLIC PANEL
Anion gap: 10 (ref 5–15)
BUN: 39 mg/dL — ABNORMAL HIGH (ref 6–20)
CO2: 24 mmol/L (ref 22–32)
Calcium: 7.8 mg/dL — ABNORMAL LOW (ref 8.9–10.3)
Chloride: 103 mmol/L (ref 98–111)
Creatinine, Ser: 0.84 mg/dL (ref 0.61–1.24)
GFR, Estimated: >60 mL/min (ref >60)
Glucose, Bld: 185 mg/dL — ABNORMAL HIGH (ref 70–99)
Potassium: 5.5 mmol/L — ABNORMAL HIGH (ref 3.5–5.1)
Sodium: 137 mmol/L (ref 135–145)

## 2020-04-02 LAB — C-REACTIVE PROTEIN: CRP: 0.9 mg/dL (ref <1.0)

## 2020-04-02 LAB — MAGNESIUM: Magnesium: 3.1 mg/dL — ABNORMAL HIGH (ref 1.7–2.4)

## 2020-04-02 LAB — PHOSPHORUS: Phosphorus: 6.1 mg/dL — ABNORMAL HIGH (ref 2.5–4.6)

## 2020-04-02 MED ORDER — ORAL CARE MOUTH RINSE
15.0000 mL | OROMUCOSAL | Status: DC
  Administered 2020-04-02 – 2020-04-29 (×274): 15 mL via OROMUCOSAL

## 2020-04-02 MED ORDER — SENNOSIDES-DOCUSATE SODIUM 8.6-50 MG PO TABS
2.0000 | ORAL_TABLET | Freq: Every day | ORAL | Status: DC
  Administered 2020-04-02 – 2020-04-04 (×3): 2
  Filled 2020-04-02 (×3): qty 2

## 2020-04-02 MED ORDER — PANTOPRAZOLE SODIUM 40 MG PO PACK
40.0000 mg | PACK | Freq: Every day | ORAL | Status: DC
  Administered 2020-04-03 – 2020-04-29 (×27): 40 mg
  Filled 2020-04-02 (×26): qty 20

## 2020-04-02 MED ORDER — POLYETHYLENE GLYCOL 3350 17 G PO PACK
17.0000 g | PACK | Freq: Every day | ORAL | Status: DC
  Administered 2020-04-03 – 2020-04-04 (×2): 17 g
  Filled 2020-04-02 (×2): qty 1

## 2020-04-02 MED ORDER — SODIUM ZIRCONIUM CYCLOSILICATE 5 G PO PACK
5.0000 g | PACK | Freq: Once | ORAL | Status: DC
  Filled 2020-04-02: qty 1

## 2020-04-02 MED ORDER — DOCUSATE SODIUM 50 MG/5ML PO LIQD
100.0000 mg | Freq: Two times a day (BID) | ORAL | Status: DC
  Administered 2020-04-02 – 2020-04-11 (×15): 100 mg
  Filled 2020-04-02 (×17): qty 10

## 2020-04-02 MED ORDER — SODIUM CHLORIDE 0.9% FLUSH
10.0000 mL | Freq: Two times a day (BID) | INTRAVENOUS | Status: DC
  Administered 2020-04-02: 10 mL
  Administered 2020-04-02: 20 mL
  Administered 2020-04-03 – 2020-04-21 (×32): 10 mL
  Administered 2020-04-22: 20 mL
  Administered 2020-04-23 – 2020-04-29 (×12): 10 mL

## 2020-04-02 MED ORDER — LIDOCAINE HCL 2 % IJ SOLN
INTRAMUSCULAR | Status: AC
  Administered 2020-04-02: 400 mg
  Filled 2020-04-02: qty 20

## 2020-04-02 MED ORDER — ASCORBIC ACID 500 MG PO TABS
500.0000 mg | ORAL_TABLET | Freq: Every day | ORAL | Status: DC
  Administered 2020-04-03 – 2020-04-28 (×26): 500 mg
  Filled 2020-04-02 (×26): qty 1

## 2020-04-02 MED ORDER — SODIUM CHLORIDE 0.9 % IV SOLN
2.0000 mg/h | INTRAVENOUS | Status: DC
  Administered 2020-04-02: 6 mg/h via INTRAVENOUS
  Administered 2020-04-03 (×2): 7.5 mg/h via INTRAVENOUS
  Filled 2020-04-02 (×5): qty 10

## 2020-04-02 MED ORDER — TRAZODONE HCL 50 MG PO TABS
50.0000 mg | ORAL_TABLET | Freq: Every day | ORAL | Status: DC
  Administered 2020-04-02 – 2020-04-03 (×2): 50 mg
  Filled 2020-04-02 (×2): qty 1

## 2020-04-02 MED ORDER — MELATONIN 3 MG PO TABS: 6.0000 mg | ORAL_TABLET | Freq: Every evening | ORAL | Status: DC | PRN

## 2020-04-02 MED ORDER — ACETAMINOPHEN 325 MG PO TABS
650.0000 mg | ORAL_TABLET | Freq: Four times a day (QID) | ORAL | Status: DC | PRN
  Administered 2020-04-02: 650 mg via NASOGASTRIC
  Filled 2020-04-02: qty 2

## 2020-04-02 MED ORDER — PHENYLEPHRINE HCL-NACL 10-0.9 MG/250ML-% IV SOLN
0.0000 ug/min | INTRAVENOUS | Status: DC
  Filled 2020-04-02: qty 250

## 2020-04-02 MED ORDER — STERILE WATER FOR INJECTION IJ SOLN
INTRAMUSCULAR | Status: AC
  Administered 2020-04-02: 10 mL
  Filled 2020-04-02: qty 10

## 2020-04-02 MED ORDER — ACETAMINOPHEN 160 MG/5ML PO SOLN
650.0000 mg | Freq: Four times a day (QID) | ORAL | Status: DC | PRN
  Administered 2020-04-07 – 2020-04-29 (×26): 650 mg via NASOGASTRIC
  Filled 2020-04-02 (×27): qty 20.3

## 2020-04-02 MED ORDER — ZINC SULFATE 220 (50 ZN) MG PO CAPS
220.0000 mg | ORAL_CAPSULE | Freq: Every day | ORAL | Status: DC
  Administered 2020-04-03 – 2020-04-28 (×26): 220 mg
  Filled 2020-04-02 (×26): qty 1

## 2020-04-02 MED ORDER — VITAL HIGH PROTEIN PO LIQD
1000.0000 mL | ORAL | Status: AC
  Administered 2020-04-02: 1000 mL

## 2020-04-02 MED ORDER — SODIUM ZIRCONIUM CYCLOSILICATE 5 G PO PACK
5.0000 g | PACK | Freq: Once | ORAL | Status: AC
  Administered 2020-04-02: 5 g
  Filled 2020-04-02: qty 1

## 2020-04-02 MED ORDER — INSULIN ASPART 100 UNIT/ML ~~LOC~~ SOLN
0.0000 [IU] | SUBCUTANEOUS | Status: DC
  Administered 2020-04-02: 5 [IU] via SUBCUTANEOUS
  Administered 2020-04-02 – 2020-04-04 (×14): 3 [IU] via SUBCUTANEOUS
  Administered 2020-04-04: 2 [IU] via SUBCUTANEOUS
  Administered 2020-04-04 – 2020-04-05 (×4): 3 [IU] via SUBCUTANEOUS
  Administered 2020-04-05: 2 [IU] via SUBCUTANEOUS
  Administered 2020-04-05 – 2020-04-07 (×10): 3 [IU] via SUBCUTANEOUS
  Administered 2020-04-07 (×2): 2 [IU] via SUBCUTANEOUS
  Administered 2020-04-07 (×2): 3 [IU] via SUBCUTANEOUS
  Administered 2020-04-08: 2 [IU] via SUBCUTANEOUS
  Administered 2020-04-08 – 2020-04-09 (×6): 3 [IU] via SUBCUTANEOUS
  Administered 2020-04-09 (×2): 2 [IU] via SUBCUTANEOUS
  Administered 2020-04-09 (×3): 3 [IU] via SUBCUTANEOUS
  Administered 2020-04-09: 5 [IU] via SUBCUTANEOUS
  Administered 2020-04-10: 2 [IU] via SUBCUTANEOUS
  Administered 2020-04-10 – 2020-04-11 (×5): 3 [IU] via SUBCUTANEOUS
  Administered 2020-04-11: 5 [IU] via SUBCUTANEOUS
  Administered 2020-04-11: 2 [IU] via SUBCUTANEOUS
  Administered 2020-04-12: 3 [IU] via SUBCUTANEOUS
  Administered 2020-04-12: 5 [IU] via SUBCUTANEOUS
  Administered 2020-04-12 (×3): 2 [IU] via SUBCUTANEOUS
  Administered 2020-04-13: 5 [IU] via SUBCUTANEOUS
  Administered 2020-04-13 (×2): 2 [IU] via SUBCUTANEOUS
  Administered 2020-04-13: 5 [IU] via SUBCUTANEOUS
  Administered 2020-04-14: 2 [IU] via SUBCUTANEOUS
  Administered 2020-04-14: 5 [IU] via SUBCUTANEOUS
  Administered 2020-04-14: 3 [IU] via SUBCUTANEOUS
  Administered 2020-04-15: 2 [IU] via SUBCUTANEOUS
  Administered 2020-04-15: 3 [IU] via SUBCUTANEOUS
  Administered 2020-04-15: 5 [IU] via SUBCUTANEOUS
  Administered 2020-04-15: 3 [IU] via SUBCUTANEOUS
  Administered 2020-04-16 – 2020-04-17 (×3): 2 [IU] via SUBCUTANEOUS
  Administered 2020-04-17: 3 [IU] via SUBCUTANEOUS
  Administered 2020-04-17 – 2020-04-19 (×8): 2 [IU] via SUBCUTANEOUS
  Administered 2020-04-19 (×2): 3 [IU] via SUBCUTANEOUS
  Administered 2020-04-19: 2 [IU] via SUBCUTANEOUS
  Administered 2020-04-19: 3 [IU] via SUBCUTANEOUS
  Administered 2020-04-19 – 2020-04-20 (×4): 2 [IU] via SUBCUTANEOUS
  Administered 2020-04-20: 5 [IU] via SUBCUTANEOUS
  Administered 2020-04-20: 3 [IU] via SUBCUTANEOUS
  Administered 2020-04-20 – 2020-04-21 (×2): 2 [IU] via SUBCUTANEOUS
  Administered 2020-04-21: 3 [IU] via SUBCUTANEOUS
  Administered 2020-04-21: 2 [IU] via SUBCUTANEOUS
  Administered 2020-04-21 – 2020-04-22 (×4): 3 [IU] via SUBCUTANEOUS
  Administered 2020-04-22: 0 [IU] via SUBCUTANEOUS
  Administered 2020-04-22: 3 [IU] via SUBCUTANEOUS
  Administered 2020-04-23: 2 [IU] via SUBCUTANEOUS
  Administered 2020-04-23: 3 [IU] via SUBCUTANEOUS
  Administered 2020-04-23 (×2): 2 [IU] via SUBCUTANEOUS
  Administered 2020-04-23: 3 [IU] via SUBCUTANEOUS
  Administered 2020-04-24 (×3): 2 [IU] via SUBCUTANEOUS
  Administered 2020-04-24: 3 [IU] via SUBCUTANEOUS
  Administered 2020-04-24: 2 [IU] via SUBCUTANEOUS
  Administered 2020-04-25 (×2): 3 [IU] via SUBCUTANEOUS
  Administered 2020-04-25 – 2020-04-26 (×2): 2 [IU] via SUBCUTANEOUS
  Administered 2020-04-26: 3 [IU] via SUBCUTANEOUS
  Administered 2020-04-26 (×3): 2 [IU] via SUBCUTANEOUS
  Administered 2020-04-26: 3 [IU] via SUBCUTANEOUS
  Administered 2020-04-27 (×3): 2 [IU] via SUBCUTANEOUS
  Administered 2020-04-27: 3 [IU] via SUBCUTANEOUS
  Administered 2020-04-27 – 2020-04-28 (×3): 2 [IU] via SUBCUTANEOUS
  Administered 2020-04-28: 3 [IU] via SUBCUTANEOUS
  Administered 2020-04-28 – 2020-04-29 (×4): 2 [IU] via SUBCUTANEOUS
  Administered 2020-04-29: 3 [IU] via SUBCUTANEOUS

## 2020-04-02 MED ORDER — HYDROXYZINE HCL 25 MG PO TABS
25.0000 mg | ORAL_TABLET | Freq: Three times a day (TID) | ORAL | Status: DC | PRN
  Administered 2020-04-02 – 2020-04-20 (×6): 25 mg
  Filled 2020-04-02 (×6): qty 1

## 2020-04-02 MED ORDER — VANCOMYCIN HCL 1250 MG/250ML IV SOLN
1250.0000 mg | Freq: Two times a day (BID) | INTRAVENOUS | Status: DC
  Administered 2020-04-02 – 2020-04-03 (×2): 1250 mg via INTRAVENOUS
  Filled 2020-04-02 (×2): qty 250

## 2020-04-02 MED ORDER — VECURONIUM BROMIDE 10 MG IV SOLR
10.0000 mg | INTRAVENOUS | Status: DC | PRN
  Administered 2020-04-02 – 2020-04-03 (×5): 10 mg via INTRAVENOUS
  Filled 2020-04-02 (×8): qty 10

## 2020-04-02 MED ORDER — SODIUM CHLORIDE 0.9% FLUSH: 10.0000 mL | INTRAVENOUS | Status: DC | PRN

## 2020-04-02 MED ORDER — CHLORHEXIDINE GLUCONATE 0.12% ORAL RINSE (MEDLINE KIT)
15.0000 mL | Freq: Two times a day (BID) | OROMUCOSAL | Status: DC
  Administered 2020-04-02 – 2020-04-29 (×55): 15 mL via OROMUCOSAL

## 2020-04-02 NOTE — Progress Notes (Signed)
Pt intubated and on critical care service, will sign off.  Please let us know if we can be of assistance.

## 2020-04-02 NOTE — Progress Notes (Signed)
Pharmacy Antibiotic Note  Dennis Howe is a 44 y.o. male admitted on 03/25/2020.  Pharmacy has been consulted for Vancomycin and Cefepime dosing for pneumonia  Today, 04/02/2020 SCr increasing, 1.1 today, re-calculate Vancomycin dose  Plan: Cefepime 2g IV q8h Vancomycin 1500 mg x1, followed by 1250mg  q12 Follow up renal function, culture results, and clinical course.   Height: 5\' 11"  (180.3 cm) Weight: 86.8 kg (191 lb 5.8 oz) IBW/kg (Calculated) : 75.3  Temp (24hrs), Avg:100.8 F (38.2 C), Min:97.6 F (36.4 C), Max:101.8 F (38.8 C)  Recent Labs  Lab 03/29/20 0440 03/30/20 0436 03/31/20 0253 04/01/20 0525 04/02/20 0524  WBC 12.6* 9.8 10.4 11.5* 17.6*  CREATININE 0.68 0.70 0.72 0.71 1.10    Estimated Creatinine Clearance: 93.2 mL/min (by C-G formula based on SCr of 1.1 mg/dL).    No Known Allergies   Antimicrobials this admission:  10/2 Remdesivir >> 10/6 10/3 Actemra x1 10/6 Actemra x1 (second dose) 10/9 Vancomycin >>  10/9 Cefepime >>   Dose adjustments this admission:  10/10 Vanc 1gm q8 > 1250mg  q12  Microbiology results:  10/2 Influenza A/B: neg; Covid positive 10/2 BC: NGF 10/7 MRSA PCR: negative 10/9 Trach asp: few GPC, rare GNR   Thank you for allowing pharmacy to be a part of this patient's care.  12/2 PharmD [facility] [id number] 04/02/2020, 9:42 AM

## 2020-04-02 NOTE — Progress Notes (Signed)
eLink Physician-Brief Progress Note Patient Name: Dennis Howe [name]: Jan 24, 1976 [name]: [id number]   Date of Service  04/02/2020  HPI/Events of Note  K+ 5.9  eICU Interventions  Lokelma 5 gm via NG tube x 1        Quilla Freeze U Deroy Noah 04/02/2020, 7:02 AM

## 2020-04-02 NOTE — Progress Notes (Addendum)
Patient proned at 1700 by RN staff and RT team. Pt was given 10 mg Vecuronium push prior, as well as PRN fentanyl and versed. Continuous versed and fentanyl gtts also infusing. Pt tolerated well, without complication. O2 sat currently 94%, BIS 50. Per Dr. Vassie Loll, okay to infuse trickle feeds while pt is in reverse trendelenburg, proned. This RN will continue to carefully monitor pt.

## 2020-04-02 NOTE — Progress Notes (Signed)
Consent for Left chest tube obtained by this RN and charge RN earlier in the day; placed on paper chart. Procedure explained earlier, and questions answered by Dr. Vassie Loll. Pt was medicated with fentanyl and versed prior/throughout procedure; tolerated well without complications. Per orders, CXR obtained and tube is connected to -20 mmHg suction. This RN will continue to monitor patient's response to interventions.

## 2020-04-02 NOTE — Procedures (Signed)
Insertion of Chest Tube Procedure Note  Dennis Howe  [id number]  [birth year]  Date:04/02/20  Time:11:20 AM    Provider Performing: Comer Locket. Pharrah Rottman   Procedure: Chest Tube Insertion (28413)  Indication(s) Pneumothorax with subcutaneous emphysema especially on the right  Consent Risks of the procedure as well as the alternatives and risks of each were explained to the patient and/or caregiver.  Consent for the procedure was obtained and is signed in the bedside chart  Anesthesia Topical only with 1% lidocaine  Sedated on fentanyl and Versed  Time Out Verified patient identification, verified procedure, site/side was marked, verified correct patient position, special equipment/implants available, medications/allergies/relevant history reviewed, required imaging and test results available.   Sterile Technique Maximal sterile technique including full sterile barrier drape, hand hygiene, sterile gown, sterile gloves, mask, hair covering, sterile ultrasound probe cover (if used).   Procedure Description Ultrasound not used to identify appropriate pleural anatomy for placement and overlying skin marked. Area of placement cleaned and draped in sterile fashion.  A 28 French chest tube was placed into the right pleural space using Kelly dissection. Appropriate return of air was obtained.  The tube was connected to atrium and placed on -20 cm H2O wall suction.   Complications/Tolerance None; patient tolerated the procedure well. Chest X-ray is ordered to verify placement.   EBL Minimal  Specimen(s) none   Dennis Howe V. Vassie Loll MD

## 2020-04-02 NOTE — Progress Notes (Signed)
eLink Physician-Brief Progress Note Patient Name: Dennis Howe [name]: 1975/08/14 [name]: [id number]   Date of Service  04/02/2020  HPI/Events of Note  R pneumothorax - R chest tube placed. No post procedure CXR ordered to assess adequate decompression.   eICU Interventions  Plan: 1. Portable CXR STAT.      Intervention Category Major Interventions: Other:  Genevia Bouldin Dennard Nip 04/02/2020, 10:03 PM

## 2020-04-02 NOTE — Progress Notes (Signed)
Pt proned at 1700, no complications noted. Vitals stable at this time, RT will continue to monitor.

## 2020-04-02 NOTE — Progress Notes (Signed)
eLink Physician-Brief Progress Note Patient Name: Dennis Howe [name]: 08/25/1975 [name]: [id number]   Date of Service  04/02/2020  HPI/Events of Note  Hypoxia - Sat = 80%  eICU Interventions  Plan: 1. Portable CXR STAT.      Intervention Category Major Interventions: Hypoxemia - evaluation and management  Jameel Quant Eugene 04/02/2020, 7:29 PM

## 2020-04-02 NOTE — Progress Notes (Signed)
eLink Physician-Brief Progress Note Patient Name: Dennis Howe [name]: 07-06-1975 [name]: [id number]   Date of Service  04/02/2020  HPI/Events of Note  Review of portable CXR post placement of R chest tube: Interval right chest tube placement with evacuation of right pneumothorax. Increasing diffuse body wall subcutaneous gas. However, R chest tube is barely in R pleural space.   eICU Interventions  Will notify ground team of findings.      Intervention Category Major Interventions: Other:  Lenell Antu 04/02/2020, 11:16 PM

## 2020-04-02 NOTE — Procedures (Signed)
Insertion of Chest Tube Procedure Note  Malcome Ambrocio  [id number]  10/19/1975  Date:04/02/20  Time:1:40 PM    Provider Performing: Comer Locket. Mitchelle Goerner   Procedure: Chest Tube Insertion (28768)  Indication(s) Pneumothorax  Consent Risks of the procedure as well as the alternatives and risks of each were explained to the patient and/or caregiver.  Consent for the procedure was obtained and is signed in the bedside chart  Anesthesia Topical only with 1% lidocaine    Time Out Verified patient identification, verified procedure, site/side was marked, verified correct patient position, special equipment/implants available, medications/allergies/relevant history reviewed, required imaging and test results available.   Sterile Technique Maximal sterile technique including full sterile barrier drape, hand hygiene, sterile gown, sterile gloves, mask, hair covering, sterile ultrasound probe cover (if used).   Procedure Description Ultrasound not used to identify appropriate pleural anatomy for placement and overlying skin marked. Area of placement cleaned and draped in sterile fashion.  A 28 French chest tube was placed into the left pleural space using Kelly dissection. Appropriate return of air was obtained.  The tube was connected to atrium and placed on -20 cm H2O wall suction.   Complications/Tolerance None; patient tolerated the procedure well. Chest X-ray is ordered to verify placement.   EBL Minimal  Specimen(s) none   Rollande Thursby V. Vassie Loll MD

## 2020-04-02 NOTE — Progress Notes (Signed)
eLink Physician-Brief Progress Note Patient Name: Dennis Howe [name]: [birth year] [name]: [id number]   Date of Service  04/02/2020  HPI/Events of Note  Ventilator dyssynchrony while well sedated, blood pressure okay now but slowly drifting down under the impact of his sedation, patient needs q 4 hour CBG with SSI coverage.  eICU Interventions  PRN Vecuronium ordered for ventilator dyssynchrony, Phenylephrine ordered to be started only if MAP drops below 65 mmHg, CBG Q 4 hourly with SSI coverage ordered, CXR and KUB previously checked, CXR with stable small right , and trace left apical pneumothoraces. KUB shows NG tube in the stomach.        Thomasene Lot Lizette Pazos 04/02/2020, 1:54 AM

## 2020-04-02 NOTE — Progress Notes (Signed)
Pt flipped back to supine position to facilitate Chest tube insertion.  Pt tolerated well, RT to monitor and assess as needed.

## 2020-04-02 NOTE — Progress Notes (Signed)
eLink Physician-Brief Progress Note Patient Name: Dennis Howe [name]: [birth year] [name]: [id number]   Date of Service  04/02/2020  HPI/Events of Note  Temp 101.1  eICU Interventions  Tylenol 650 mg via NG tube Q  4 hours PRN.        Bunnie Rehberg U Manpreet Kemmer 04/02/2020, 4:20 AM

## 2020-04-02 NOTE — Progress Notes (Signed)
Patient supinated due to large right pneumothorax. Right chest tube without bubbling. Wall suction was changed and some bubbling then seen on -40 cmH2O. Decision made to place new apically-directed right-sided chest tube which was placed successfully. 28 Fr chest tube placed without complication. Large leak noted in chest tube atrium post-placement. Asked nursing to order post-procedural chest XR. Chest tube left at -40 cmH2O pending CXR. Will maintain left-sided chest tube to -20 cmH2O. No air leak noted on that side. Keep patient supine overnight.  Marcelo Baldy, MD 04/02/20 10:48 PM

## 2020-04-02 NOTE — Procedures (Signed)
Insertion of Chest Tube Procedure Note  [name]  [id number]  [birth year]  Date:04/02/20  Time:10:40 PM    Provider Performing: Shari Heritage Kennidy Lamke   Procedure: Chest Tube Insertion [phone])  Indication(s) Pneumothorax  Consent Risks of the procedure as well as the alternatives and risks of each were explained to the patient and/or caregiver.  Consent for the procedure was obtained and is signed in the bedside chart  Anesthesia None   Time Out Verified patient identification, verified procedure, site/side was marked, verified correct patient position, special equipment/implants available, medications/allergies/relevant history reviewed, required imaging and test results available.   Sterile Technique Maximal sterile technique including full sterile barrier drape, hand hygiene, sterile gown, sterile gloves, mask, hair covering, sterile ultrasound probe cover (if used).   Procedure Description Ultrasound not used to identify appropriate pleural anatomy for placement and overlying skin marked. Area of placement cleaned and draped in sterile fashion.  A 28 French chest tube was placed into the right pleural space using Kelly dissection. Appropriate return of air was obtained.  The tube was connected to atrium and placed on -40 cm H2O wall suction.   Complications/Tolerance None; patient tolerated the procedure well. Chest X-ray is ordered to verify placement.   EBL Minimal  Specimen(s) none

## 2020-04-02 NOTE — Progress Notes (Signed)
Pt O2 sat droped to 80% on 100% FiO2. Sedation and vec given, BIS 48. RT notified and instructed RT to increase PEEP to 16. No improvement noted. Awaiting further orders at this time, RN will continue to carefully monitor.

## 2020-04-02 NOTE — Progress Notes (Signed)
Consent for Right chest tube obtained by this RN and charge RN.; placed on paper chart. Procedure explained and questions answered by Dr. Vassie Loll. Pt was medicated with fentanyl and versed prior/throughout procedure; tolerated well without complications. Per orders, CXR obtained and tube is connected to -20 mmHG suction. This RN will continue to monitor patient's response to interventions.

## 2020-04-02 NOTE — Progress Notes (Addendum)
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  Evaluated in ED 9/30 for nausea/vomiting.  He presented with substernal chest pain and shortness of breath on 10/2 Initially requiring 2 L of oxygen.  Was treated with steroids and remdesivir .  Developed progressive multifocal bilateral infiltrates and hypoxia, requiring high flow oxygen.  Received Actemra 10/3 & 10/6 .  Due to rising D-dimer lower extremity ultrasound and echo was obtained.  PCCM consulted 10/8 Past Medical History  None  Significant Hospital Events   10/2 hospital admission 10/8 subcutaneous air in neck and?  Right apical pneumothorax versus artifact 10/9 intubated due to prolonged desaturation to 70s without recovery   Consults:    Procedures:  ETT 10/9 >> Right IJ CVL 10/9 >> Right chest tube 10/10>>  Significant Diagnostic Tests:  Chest x-ray 10/8 subcutaneous air in neck,?  Right apical pneumothorax 10/7 venous duplex negative BLE  10/3 echo [id number]  Micro Data:  10/2 Bc >> ng 10/9 resp >>  Antimicrobials:   10/9 cefepime >> 10/9 vancomycin >>  Interim history/subjective:   Critically ill, intubated Sedated on Versed and fentanyl, intermittent paralytic 100%/PEEP of 14  Objective   Blood pressure 116/76, pulse (!) 108, temperature (!) 100.6 F (38.1 C), temperature source Bladder, resp. rate (!) 31, height 5\' 11"  (1.803 m), weight 86.8 kg, SpO2 93 %.    Vent Mode: PRVC FiO2 (%):  [100 %] 100 % Set Rate:  [32 bmp-35 bmp] 35 bmp Vt Set:   [450 mL] 450 mL PEEP:  [14 cmH20] 14 cmH20 Plateau Pressure:  [30 cmH20-36 cmH20] 30 cmH20   Intake/Output Summary (Last 24 hours) at 04/02/2020 1122 Last data filed at 04/02/2020 0800 Gross per 24 hour  Intake 766.44 ml  Output 1650 ml  Net -883.56 ml   Filed Weights   03/31/20 0500 04/01/20 0500 04/02/20 0500  Weight: 99.1 kg 86.9 kg 86.8 kg    Examination: Gen:   Middle-age man man, intubated, sedated HEENT:  EOMI, sclera anicteric, no pallor Neck:     No JVD; no thyromegaly , fullness on the right with crepitus Lungs:   Bilateral scattered crackles CV:         Regular rate and rhythm; no murmurs Abd:      + bowel sounds; soft, non-tender; no palpable masses, no distension Ext:    No edema; adequate peripheral perfusion Skin:      Warm and dry; no rash Neuro: Sedated, RASS -5   Chest x-ray 10/10 and others independently reviewed, right apical pneumothorax is unchanged, subcutaneous air appears worse, left apical pneumothorax commented on by radiologist but not convincing  Labs show hyperkalemia, increase leukocytosis, slight increase in creatinine, increased triglycerides and mild hyperglycemia, polycythemia  Resolved Hospital Problem list     Assessment & Plan:  ARDS due to COVID pneumonia  -Continue low tidal volume ventilation per ARDS protocol Target TV 6cc/kgIBW Target Plateau Pressure < 30cm H20 , at goal Target driving pressure less than 15 cm of water , at goal Tolerating PEEP of 14 without any worsening of pneumothorax  Target PaO2 55-65: titrate PEEP/FiO2 per protocol Consider prone position 16h/day  while PaO2 to FiO2 ratio remains less than 1:150  Check CVP daily if CVL in place Target CVP less than 4, diurese as necessary   COVID-19 pneumonia -Has completed remdesivir & received Actemra -Continue steroids,  ?  Right apical pneumothorax noted on chest x-ray 10/8 Increased subcutaneous emphysema in his neck and evidence of pneumomediastinum on chest  x-ray -Placed right chest tube with good air leak, monitor left side with twice daily chest x-ray  Possible H CAP -Await respiratory culture, broad-spectrum antibiotics started 10/9  AKI/hyperkalemia -No EKG changes -Given Lokelma, repeat potassium  Steroid-induced hyperglycemia -SSI -Starting tube feeds at trickle  Best practice:  Diet: Tube feeds Pain/Anxiety/Delirium protocol (if indicated): N/A VAP protocol (if indicated): N/A DVT prophylaxis: Lovenox GI prophylaxis: Protonix Glucose control: SSI while on steroids Mobility: Bedrest  Code Status: Full Family Communication: Wife updated in detail Disposition: ICU  Labs   CBC: Recent Labs  Lab 03/28/20 0522 03/28/20 0522 03/29/20 0440 03/30/20 0436 03/31/20 0253 04/01/20 0525 04/02/20 0524  WBC 11.5*   < > 12.6* 9.8 10.4 11.5* 17.6*  NEUTROABS 10.4*  --  11.5* 8.6* 9.3*  --  16.4*  HGB 15.5   < > 15.3 15.9 17.0 17.6* 18.6*  HCT 44.9   < > 44.9 47.0 49.7 50.7 55.5*  MCV 88.0   < > 88.2 88.8 87.7 86.8 89.2  PLT 307   < > 325 390 423* 433* 316   < > = values in this interval not displayed.    Basic Metabolic Panel: Recent Labs  Lab 03/29/20 0440 03/30/20 0436 03/31/20 0253 04/01/20 0525 04/02/20 0524  NA 134* 137 136 135 134*  K 4.7 4.8 4.9 5.0 5.9*  CL 103 104 102 103 99  CO2 21* 22 20* 21* 23  GLUCOSE 165* 146* 173* 169* 178*  BUN 26* 25* 32* 35* 39*  CREATININE 0.68 0.70 0.72 0.71 1.10  CALCIUM 8.0* 8.6* 8.5* 8.4* 8.2*  MG  --   --  2.6* 2.7* 3.1*  PHOS  --   --  4.6 4.9* 6.1*   GFR: Estimated Creatinine Clearance: 93.2 mL/min (by C-G formula based on SCr of 1.1 mg/dL). Recent Labs  Lab 03/30/20 0436 03/31/20 0253 04/01/20 0525 04/02/20 0524  WBC 9.8 10.4 11.5* 17.6*     Critical care time x 40m    Cyril Mourning MD. FCCP. [facility] See Amion for pager  If no response to pager , please call [phone]  After 7:00 pm call Elink  [phone]    04/02/2020

## 2020-04-02 NOTE — Progress Notes (Signed)
eLink Physician-Brief Progress Note Patient Name: Dennis Howe [name]: [birth year] [name]: [id number]   Date of Service  04/02/2020  HPI/Events of Note  Patient needs KUB to check NG tube.  eICU Interventions  KUB ordered.        Thomasene Lot Alannah Averhart 04/02/2020, 12:14 AM

## 2020-04-03 ENCOUNTER — Inpatient Hospital Stay (HOSPITAL_COMMUNITY): Payer: 59

## 2020-04-03 DIAGNOSIS — J9601 Acute respiratory failure with hypoxia: Secondary | ICD-10-CM | POA: Diagnosis not present

## 2020-04-03 DIAGNOSIS — U071 COVID-19: Secondary | ICD-10-CM | POA: Diagnosis not present

## 2020-04-03 LAB — BASIC METABOLIC PANEL
Anion gap: 7 (ref 5–15)
Anion gap: 6 (ref 5–15)
BUN: 38 mg/dL — ABNORMAL HIGH (ref 6–20)
BUN: 41 mg/dL — ABNORMAL HIGH (ref 6–20)
CO2: 27 mmol/L (ref 22–32)
CO2: 28 mmol/L (ref 22–32)
Calcium: 7.8 mg/dL — ABNORMAL LOW (ref 8.9–10.3)
Calcium: 7.8 mg/dL — ABNORMAL LOW (ref 8.9–10.3)
Chloride: 103 mmol/L (ref 98–111)
Chloride: 105 mmol/L (ref 98–111)
Creatinine, Ser: 0.85 mg/dL (ref 0.61–1.24)
Creatinine, Ser: 0.73 mg/dL (ref 0.61–1.24)
GFR, Estimated: >60 mL/min (ref >60)
GFR, Estimated: >60 mL/min (ref >60)
Glucose, Bld: 192 mg/dL — ABNORMAL HIGH (ref 70–99)
Glucose, Bld: 186 mg/dL — ABNORMAL HIGH (ref 70–99)
Potassium: 5.8 mmol/L — ABNORMAL HIGH (ref 3.5–5.1)
Potassium: 5.4 mmol/L — ABNORMAL HIGH (ref 3.5–5.1)
Sodium: 137 mmol/L (ref 135–145)
Sodium: 139 mmol/L (ref 135–145)

## 2020-04-03 LAB — BLOOD GAS, ARTERIAL
Acid-Base Excess: 1.1 mmol/L (ref 0.0–2.0)
Acid-base deficit: 0.4 mmol/L (ref 0.0–2.0)
Bicarbonate: 27.4 mmol/L (ref 20.0–28.0)
Bicarbonate: 28.5 mmol/L — ABNORMAL HIGH (ref 20.0–28.0)
FIO2: 90
FIO2: 60
MECHVT: 450 mL
O2 Saturation: 96.5 %
O2 Saturation: 91.2 %
PEEP: 14 cmH2O
Patient temperature: 99.3
Patient temperature: 98.6
RATE: 35 resp/min
pCO2 arterial: 59.7 mmHg — ABNORMAL HIGH (ref 32.0–48.0)
pCO2 arterial: 58.9 mmHg — ABNORMAL HIGH (ref 32.0–48.0)
pH, Arterial: 7.286 — ABNORMAL LOW (ref 7.350–7.450)
pH, Arterial: 7.306 — ABNORMAL LOW (ref 7.350–7.450)
pO2, Arterial: 97.1 mmHg (ref 83.0–108.0)
pO2, Arterial: 67.5 mmHg — ABNORMAL LOW (ref 83.0–108.0)

## 2020-04-03 LAB — CULTURE, RESPIRATORY W GRAM STAIN

## 2020-04-03 LAB — CBC
HCT: 47.7 % (ref 39.0–52.0)
Hemoglobin: 16 g/dL (ref 13.0–17.0)
MCH: 30.6 pg (ref 26.0–34.0)
MCHC: 33.5 g/dL (ref 30.0–36.0)
MCV: 91.2 fL (ref 80.0–100.0)
Platelets: 221 10*3/uL (ref 150–400)
RBC: 5.23 MIL/uL (ref 4.22–5.81)
RDW: 12.1 % (ref 11.5–15.5)
WBC: 12.9 10*3/uL — ABNORMAL HIGH (ref 4.0–10.5)
nRBC: 0 % (ref 0.0–0.2)

## 2020-04-03 LAB — GLUCOSE, CAPILLARY
Glucose-Capillary: 169 mg/dL — ABNORMAL HIGH (ref 70–99)
Glucose-Capillary: 195 mg/dL — ABNORMAL HIGH (ref 70–99)
Glucose-Capillary: 160 mg/dL — ABNORMAL HIGH (ref 70–99)
Glucose-Capillary: 148 mg/dL — ABNORMAL HIGH (ref 70–99)

## 2020-04-03 LAB — TRIGLYCERIDES: Triglycerides: 400 mg/dL — ABNORMAL HIGH (ref <150)

## 2020-04-03 LAB — MAGNESIUM: Magnesium: 3.2 mg/dL — ABNORMAL HIGH (ref 1.7–2.4)

## 2020-04-03 LAB — PHOSPHORUS: Phosphorus: 4.2 mg/dL (ref 2.5–4.6)

## 2020-04-03 MED ORDER — MIDAZOLAM 50MG/50ML (1MG/ML) PREMIX INFUSION
2.0000 mg/h | INTRAVENOUS | Status: DC
  Administered 2020-04-03: 10 mg/h via INTRAVENOUS
  Filled 2020-04-03 (×2): qty 50

## 2020-04-03 MED ORDER — GUAIFENESIN-DM 100-10 MG/5ML PO SYRP: 10.0000 mL | ORAL_SOLUTION | ORAL | Status: DC | PRN

## 2020-04-03 MED ORDER — PROSOURCE TF PO LIQD
90.0000 mL | Freq: Four times a day (QID) | ORAL | Status: DC
  Administered 2020-04-03 – 2020-04-13 (×40): 90 mL
  Filled 2020-04-03 (×38): qty 90

## 2020-04-03 MED ORDER — ONDANSETRON HCL 4 MG/2ML IJ SOLN
4.0000 mg | Freq: Four times a day (QID) | INTRAMUSCULAR | Status: DC | PRN
  Administered 2020-04-29: 4 mg via INTRAVENOUS
  Filled 2020-04-03: qty 2

## 2020-04-03 MED ORDER — SODIUM ZIRCONIUM CYCLOSILICATE 10 G PO PACK
10.0000 g | PACK | ORAL | Status: AC
  Administered 2020-04-03: 10 g
  Filled 2020-04-03: qty 1

## 2020-04-03 MED ORDER — NEPRO/CARBSTEADY PO LIQD
1000.0000 mL | ORAL | Status: DC
  Administered 2020-04-03 – 2020-04-06 (×6): 1000 mL
  Filled 2020-04-03 (×8): qty 1000

## 2020-04-03 MED ORDER — VANCOMYCIN HCL 1500 MG/300ML IV SOLN
1500.0000 mg | Freq: Two times a day (BID) | INTRAVENOUS | Status: DC
  Administered 2020-04-03 – 2020-04-04 (×2): 1500 mg via INTRAVENOUS
  Filled 2020-04-03 (×3): qty 300

## 2020-04-03 MED ORDER — SODIUM ZIRCONIUM CYCLOSILICATE 10 G PO PACK
10.0000 g | PACK | ORAL | Status: DC
  Administered 2020-04-03: 10 g via ORAL
  Filled 2020-04-03 (×2): qty 1

## 2020-04-03 MED ORDER — ONDANSETRON HCL 4 MG PO TABS
4.0000 mg | ORAL_TABLET | Freq: Four times a day (QID) | ORAL | Status: DC | PRN
  Administered 2020-04-28: 4 mg
  Filled 2020-04-03: qty 1

## 2020-04-03 MED ORDER — MIDAZOLAM BOLUS VIA INFUSION
1.0000 mg | INTRAVENOUS | Status: DC | PRN
  Administered 2020-04-08: 1 mg via INTRAVENOUS
  Administered 2020-04-08 (×3): 2 mg via INTRAVENOUS
  Administered 2020-04-08: 1 mg via INTRAVENOUS
  Administered 2020-04-09 – 2020-04-21 (×27): 2 mg via INTRAVENOUS
  Filled 2020-04-03: qty 2

## 2020-04-03 NOTE — Consult Note (Addendum)
ECMO Consult Note   Called to [facility] ICU for ECMO Consult at (time)[phone] by Belva Crome MD Admitting Diagnosis- Covid pneumonia Primary Issue- ARDS Age:44 y.o. Weight: 89kg, BMI 27  Days on Mechanical Ventilation- 3 days , 5 days on HHFNC  MAP FiO2 Oxygen Index P/F Ratio  80 90 N/A 107   Vasopressors no  MSOF No   RESP score (VV-ECMO) : 3 - 76% survival http://www.respscore.com  SAVE score (VA-ECMO) : N/A http://www.save-score.com  Recent Blood Gas:     Component Value Date/Time   PHART 7.286 (L) 04/03/2020 0600   PCO2ART 59.7 (H) 04/03/2020 0600   PO2ART 97.1 04/03/2020 0600   HCO3 27.4 04/03/2020 0600   ACIDBASEDEF 0.4 04/03/2020 0600   O2SAT 96.5 04/03/2020 0600    Coags:    Component Value Date/Time   FIBRINOGEN <60 (LL) 03/25/2020 1310    CBC    Component Value Date/Time   WBC 12.9 (H) 04/03/2020 0552   RBC 5.23 04/03/2020 0552   HGB 16.0 04/03/2020 0552   HCT 47.7 04/03/2020 0552   PLT 221 04/03/2020 0552   MCV 91.2 04/03/2020 0552   MCH 30.6 04/03/2020 0552   MCHC 33.5 04/03/2020 0552   RDW 12.1 04/03/2020 0552   LYMPHSABS 0.5 (L) 04/02/2020 0524   MONOABS 0.4 04/02/2020 0524   EOSABS 0.0 04/02/2020 0524   BASOSABS 0.1 04/02/2020 0524    BMET    Component Value Date/Time   NA 137 04/03/2020 0552   K 5.8 (H) 04/03/2020 0552   CL 103 04/03/2020 0552   CO2 27 04/03/2020 0552   GLUCOSE 192 (H) 04/03/2020 0552   BUN 38 (H) 04/03/2020 0552   CREATININE 0.85 04/03/2020 0552   CALCIUM 7.8 (L) 04/03/2020 0552   GFRNONAA >60 04/03/2020 0552   GFRAA >60 03/28/2020 0522                                                                                                                                                             ECMO physician Maikol Grassia notified at 1500 (time) Candidate meets ECMO Criteria- Yes  Placed on ECMO watch at 1700 (time).  Additional notes  Dennis Howe is an 44 y.o. male.  HPI: 44 year old man who is  unvaccinated. Tested positive for COVID 9/28.  Admitted 10/2 with hypoxia. 5 days on HHFNC prior to intubation 3 days ago.   Bilateral pneumothoraces post intubation with worsening when proned. Additional tube placed.  Past Medical History:  Diagnosis Date  . BMI 30.0-30.9,adult     History reviewed. No pertinent surgical history.  History reviewed. No pertinent family history.  Social History:  has no history on file for tobacco use, alcohol use, and drug use.  Allergies: No Known Allergies  Medications: I have reviewed the patient's  current medications.  Results for orders placed or performed during the hospital encounter of 03/25/20 (from the past 48 hour(s))  Glucose, capillary     Status: Abnormal   Collection Time: 04/01/20  5:29 PM  Result Value Ref Range   Glucose-Capillary 181 (H) 70 - 99 mg/dL    Comment: Glucose reference range applies only to samples taken after fasting for at least 8 hours.  Draw ABG 1 hour after initiation of ventilator     Status: Abnormal   Collection Time: 04/01/20  7:04 PM  Result Value Ref Range   FIO2 100%    Delivery systems VENTILATOR    Mode PRESSURE REGULATED VOLUME CONTROL    VT 450 mL   LHR 32 resp/min   Peep/cpap 14 cm H20   pH, Arterial 7.241 (L) 7.35 - 7.45   pCO2 arterial 56.0 (H) 32 - 48 mmHg   pO2, Arterial 88.9 83 - 108 mmHg   Bicarbonate 23.2 20.0 - 28.0 mmol/L   Acid-base deficit 5.2 (H) 0.0 - 2.0 mmol/L   O2 Saturation 93.4 %   Patient temperature 98.7    Collection site LEFT BRACHIAL    Drawn by [id number]    Allens test (pass/fail) PASS PASS    Comment: Performed at [facility], [address] [place], [place] [address]  Culture, respiratory (non-expectorated)     Status: None   Collection Time: 04/01/20  7:30 PM   Specimen: Tracheal Aspirate; Respiratory  Result Value Ref Range   Specimen Description      TRACHEAL ASPIRATE Performed at [facility], [address],  [place], [place] [address]    Special Requests      NONE Performed at [facility], [address], [place], [place] [address]    Gram Stain      RARE WBC PRESENT, PREDOMINANTLY PMN FEW GRAM POSITIVE COCCI IN PAIRS RARE GRAM NEGATIVE RODS    Culture      ABUNDANT HAEMOPHILUS INFLUENZAE BETA LACTAMASE POSITIVE Performed at [facility], [address] [place], [place] [address]    Report Status 04/03/2020 FINAL   D-dimer, quantitative (not at [facility])     Status: Abnormal   Collection Time: 04/01/20  7:46 PM  Result Value Ref Range   D-Dimer, Quant 1.69 (H) 0.00 - 0.50 ug/mL-FEU    Comment: (NOTE) At the manufacturer cut-off value of 0.5 g/mL FEU, this assay has a negative predictive value of 95-100%.This assay is intended for use in conjunction with a clinical pretest probability (PTP) assessment model to exclude pulmonary embolism (PE) and deep venous thrombosis (DVT) in outpatients suspected of PE or DVT. Results should be correlated with clinical presentation. Performed at [facility], [address] [place], [place] [address]   Glucose, capillary     Status: Abnormal   Collection Time: 04/01/20 11:47 PM  Result Value Ref Range   Glucose-Capillary 176 (H) 70 - 99 mg/dL    Comment: Glucose reference range applies only to samples taken after fasting for at least 8 hours.  Blood gas, arterial     Status: Abnormal   Collection Time: 04/02/20 12:01 AM  Result Value Ref Range   FIO2 100%    Delivery systems VENTILATOR    Mode PRESSURE REGULATED VOLUME CONTROL    VT 450 mL   LHR 35 resp/min   Peep/cpap 14 cm H20   pH, Arterial 7.279 (L) 7.35 - 7.45   pCO2 arterial 48.8 (H) 32 -  48 mmHg   pO2, Arterial 80.2 (L) 83 - 108 mmHg   Bicarbonate 22.3 20.0 - 28.0 mmol/L   Acid-base deficit 4.9 (H) 0.0 - 2.0 mmol/L   O2 Saturation 93.0 %   Patient temperature 98.1    Collection site RIGHT RADIAL    Drawn by [id number]    Allens test  (pass/fail) PASS PASS    Comment: Performed at [facility], [address], [place], [place] [address]  CBC with Differential/Platelet     Status: Abnormal   Collection Time: 04/02/20  5:24 AM  Result Value Ref Range   WBC 17.6 (H) 4.0 - 10.5 K/uL   RBC 6.22 (H) 4.22 - 5.81 MIL/uL   Hemoglobin 18.6 (H) 13.0 - 17.0 g/dL   HCT 98.1 (H) 39 - 52 %   MCV 89.2 80.0 - 100.0 fL   MCH 29.9 26.0 - 34.0 pg   MCHC 33.5 30.0 - 36.0 g/dL   RDW 19.1 47.8 - 29.5 %   Platelets 316 150 - 400 K/uL   nRBC 0.0 0.0 - 0.2 %   Neutrophils Relative % 93 %   Neutro Abs 16.4 (H) 1.7 - 7.7 K/uL   Lymphocytes Relative 3 %   Lymphs Abs 0.5 (L) 0.7 - 4.0 K/uL   Monocytes Relative 2 %   Monocytes Absolute 0.4 0.1 - 1.0 K/uL   Eosinophils Relative 0 %   Eosinophils Absolute 0.0 0 - 0 K/uL   Basophils Relative 1 %   Basophils Absolute 0.1 0 - 0 K/uL   Immature Granulocytes 1 %   Abs Immature Granulocytes 0.16 (H) 0.00 - 0.07 K/uL    Comment: Performed at [facility], [address] [place], [place] [address]  Comprehensive metabolic panel     Status: Abnormal   Collection Time: 04/02/20  5:24 AM  Result Value Ref Range   Sodium 134 (L) 135 - 145 mmol/L   Potassium 5.9 (H) 3.5 - 5.1 mmol/L   Chloride 99 98 - 111 mmol/L   CO2 23 22 - 32 mmol/L   Glucose, Bld 178 (H) 70 - 99 mg/dL    Comment: Glucose reference range applies only to samples taken after fasting for at least 8 hours.   BUN 39 (H) 6 - 20 mg/dL   Creatinine, Ser 8.65 0.61 - 1.24 mg/dL   Calcium 8.2 (L) 8.9 - 10.3 mg/dL   Total Protein 7.0 6.5 - 8.1 g/dL   Albumin 3.4 (L) 3.5 - 5.0 g/dL   AST 19 15 - 41 U/L   ALT 51 (H) 0 - 44 U/L   Alkaline Phosphatase 108 38 - 126 U/L   Total Bilirubin 1.4 (H) 0.3 - 1.2 mg/dL   GFR, Estimated >78 >46 mL/min   Anion gap 12 5 - 15    Comment: Performed at [facility], [address] [place], [place] [address]  Magnesium     Status: Abnormal    Collection Time: 04/02/20  5:24 AM  Result Value Ref Range   Magnesium 3.1 (H) 1.7 - 2.4 mg/dL    Comment: Performed at [facility], [address], [place], [place] [address]  Phosphorus     Status: Abnormal   Collection Time: 04/02/20  5:24 AM  Result Value Ref Range   Phosphorus 6.1 (H) 2.5 - 4.6 mg/dL    Comment: Performed at [facility], [address], [place], [place] [address]  C-reactive protein  Status: None   Collection Time: 04/02/20  5:24 AM  Result Value Ref Range   CRP 0.9 <1.0 mg/dL    Comment: Performed at [facility], [address], [place], [place] [address]  D-dimer, quantitative (not at [facility])     Status: Abnormal   Collection Time: 04/02/20  5:24 AM  Result Value Ref Range   D-Dimer, Quant 3.70 (H) 0.00 - 0.50 ug/mL-FEU    Comment: (NOTE) At the manufacturer cut-off value of 0.5 g/mL FEU, this assay has a negative predictive value of 95-100%.This assay is intended for use in conjunction with a clinical pretest probability (PTP) assessment model to exclude pulmonary embolism (PE) and deep venous thrombosis (DVT) in outpatients suspected of PE or DVT. Results should be correlated with clinical presentation. Performed at [facility], [address] [place], [place] [address]   Triglycerides     Status: Abnormal   Collection Time: 04/02/20  5:24 AM  Result Value Ref Range   Triglycerides 485 (H) <150 mg/dL    Comment: Performed at [facility], [address], [place], [place] [address]  Glucose, capillary     Status: Abnormal   Collection Time: 04/02/20  5:40 AM  Result Value Ref Range   Glucose-Capillary 182 (H) 70 - 99 mg/dL    Comment: Glucose reference range applies only to samples taken after fasting for at least 8 hours.  Glucose, capillary     Status: Abnormal   Collection Time: 04/02/20  8:07 AM  Result Value Ref Range   Glucose-Capillary 196 (H)  70 - 99 mg/dL    Comment: Glucose reference range applies only to samples taken after fasting for at least 8 hours.  Glucose, capillary     Status: Abnormal   Collection Time: 04/02/20 11:20 AM  Result Value Ref Range   Glucose-Capillary 191 (H) 70 - 99 mg/dL    Comment: Glucose reference range applies only to samples taken after fasting for at least 8 hours.   Comment 1 Notify RN    Comment 2 Document in Chart   Glucose, capillary     Status: Abnormal   Collection Time: 04/02/20  3:38 PM  Result Value Ref Range   Glucose-Capillary 208 (H) 70 - 99 mg/dL    Comment: Glucose reference range applies only to samples taken after fasting for at least 8 hours.  Basic metabolic panel     Status: Abnormal   Collection Time: 04/02/20  4:00 PM  Result Value Ref Range   Sodium 137 135 - 145 mmol/L   Potassium 5.5 (H) 3.5 - 5.1 mmol/L   Chloride 103 98 - 111 mmol/L   CO2 24 22 - 32 mmol/L   Glucose, Bld 185 (H) 70 - 99 mg/dL    Comment: Glucose reference range applies only to samples taken after fasting for at least 8 hours.   BUN 39 (H) 6 - 20 mg/dL   Creatinine, Ser 5.78 0.61 - 1.24 mg/dL   Calcium 7.8 (L) 8.9 - 10.3 mg/dL   GFR, Estimated >46 >96 mL/min   Anion gap 10 5 - 15    Comment: Performed at [facility], [address], [place], [place] [address]  Glucose, capillary     Status: Abnormal   Collection Time: 04/02/20  8:10 PM  Result Value Ref Range   Glucose-Capillary 199 (H) 70 - 99 mg/dL    Comment: Glucose reference range applies only to samples taken after fasting for at  least 8 hours.  Glucose, capillary     Status: Abnormal   Collection Time: 04/02/20 11:23 PM  Result Value Ref Range   Glucose-Capillary 186 (H) 70 - 99 mg/dL    Comment: Glucose reference range applies only to samples taken after fasting for at least 8 hours.  Glucose, capillary     Status: Abnormal   Collection Time: 04/03/20  5:47 AM  Result Value Ref Range   Glucose-Capillary 169 (H)  70 - 99 mg/dL    Comment: Glucose reference range applies only to samples taken after fasting for at least 8 hours.  Triglycerides     Status: Abnormal   Collection Time: 04/03/20  5:52 AM  Result Value Ref Range   Triglycerides 400 (H) <150 mg/dL    Comment: Performed at [facility], [address], [place], [place] [address]  CBC     Status: Abnormal   Collection Time: 04/03/20  5:52 AM  Result Value Ref Range   WBC 12.9 (H) 4.0 - 10.5 K/uL   RBC 5.23 4.22 - 5.81 MIL/uL   Hemoglobin 16.0 13.0 - 17.0 g/dL   HCT 03.5 39 - 52 %   MCV 91.2 80.0 - 100.0 fL   MCH 30.6 26.0 - 34.0 pg   MCHC 33.5 30.0 - 36.0 g/dL   RDW 00.9 38.1 - 82.9 %   Platelets 221 150 - 400 K/uL   nRBC 0.0 0.0 - 0.2 %    Comment: Performed at [facility], [address], [place], [place] [address]  Basic metabolic panel     Status: Abnormal   Collection Time: 04/03/20  5:52 AM  Result Value Ref Range   Sodium 137 135 - 145 mmol/L   Potassium 5.8 (H) 3.5 - 5.1 mmol/L   Chloride 103 98 - 111 mmol/L   CO2 27 22 - 32 mmol/L   Glucose, Bld 192 (H) 70 - 99 mg/dL    Comment: Glucose reference range applies only to samples taken after fasting for at least 8 hours.   BUN 38 (H) 6 - 20 mg/dL   Creatinine, Ser 9.67 0.61 - 1.24 mg/dL   Calcium 7.8 (L) 8.9 - 10.3 mg/dL   GFR, Estimated >89 >38 mL/min   Anion gap 7 5 - 15    Comment: Performed at [facility], [address] [place], [place] [address]  Magnesium     Status: Abnormal   Collection Time: 04/03/20  5:52 AM  Result Value Ref Range   Magnesium 3.2 (H) 1.7 - 2.4 mg/dL    Comment: Performed at [facility], [address], [place], [place] [address]  Phosphorus     Status: None   Collection Time: 04/03/20  5:52 AM  Result Value Ref Range   Phosphorus 4.2 2.5 - 4.6 mg/dL    Comment: Performed at [facility], [address], [place], [place] [address]  Blood gas,  arterial     Status: Abnormal   Collection Time: 04/03/20  6:00 AM  Result Value Ref Range   FIO2 90.00    Delivery systems VENTILATOR    Mode PRESSURE REGULATED VOLUME CONTROL    VT 450 mL   LHR 35 resp/min   Peep/cpap 14.0 cm H20   pH, Arterial 7.286 (L) 7.35 - 7.45   pCO2 arterial 59.7 (H) 32 - 48 mmHg   pO2, Arterial 97.1 83 - 108 mmHg   Bicarbonate 27.4 20.0 - 28.0 mmol/L  Acid-base deficit 0.4 0.0 - 2.0 mmol/L   O2 Saturation 96.5 %   Patient temperature 99.3    Allens test (pass/fail) PASS PASS    Comment: Performed at [facility], [address] [place], [place] [id number]  Glucose, capillary     Status: Abnormal   Collection Time: 04/03/20  8:35 AM  Result Value Ref Range   Glucose-Capillary 195 (H) 70 - 99 mg/dL    Comment: Glucose reference range applies only to samples taken after fasting for at least 8 hours.   Comment 1 Notify RN    Comment 2 Document in Chart     DG Abd 1 View  Result Date: 04/02/2020 CLINICAL DATA:  OG tube placed EXAM: ABDOMEN - 1 VIEW COMPARISON:  None. FINDINGS: The enteric tube projects over the gastric body. The bowel gas pattern is unremarkable. IMPRESSION: The enteric tube projects over the gastric body. Electronically Signed   By: Katherine Mantlehristopher  Green M.D.   On: 04/02/2020 00:36   DG Abd 1 View  Result Date: 04/01/2020 CLINICAL DATA:  OG tube placement EXAM: ABDOMEN - 1 VIEW COMPARISON:  None. FINDINGS: The OG tube is not visualized. There is gaseous distention of the stomach. The bowel gas pattern is otherwise unremarkable. IMPRESSION: 1. Nonvisualization of the OG tube. 2. Gaseous distention of the stomach. Electronically Signed   By: Katherine Mantlehristopher  Green M.D.   On: 04/01/2020 21:22   DG CHEST PORT 1 VIEW  Result Date: 04/03/2020 CLINICAL DATA:  Chest tube in place.  COVID-19. EXAM: PORTABLE CHEST 1 VIEW COMPARISON:  04/02/2020 FINDINGS: The patient is mildly rotated to the left. Endotracheal tube terminates near the  inferior margin of the clavicular heads, well above the carina. The more superiorly located right chest tube has been advanced and now terminates over the right lung apex. The second right chest tube and the left chest tube are unchanged. No definite pneumothorax is identified. Extensive subcutaneous emphysema remains throughout the chest wall and lower neck bilaterally, improved in the right chest wall since the prior study. There are persistent asymmetric patchy airspace opacities in the left mid and lower lung. No sizable pleural effusion is identified. IMPRESSION: 1. Interval advancement of one of the right chest tube. No definite pneumothorax. 2. Extensive subcutaneous emphysema, decreased in the right chest wall. 3. Persistent asymmetric left lung airspace disease. Electronically Signed   By: Sebastian AcheAllen  Grady M.D.   On: 04/03/2020 04:46   DG CHEST PORT 1 VIEW  Result Date: 04/02/2020 CLINICAL DATA:  Respiratory failure, pneumothorax EXAM: PORTABLE CHEST 1 VIEW COMPARISON:  7:46 p.m. FINDINGS: Since the prior examination, a second chest tube has been placed within the a right mid lung zone and large right pneumothorax noted on prior examination has been completely evacuated. Pre-existing right chest tube crosses the midline anteriorly. Right internal jugular central venous catheter with its tip within the superior vena cava, nasogastric tube with its tip overlying the expected proximal body of the stomachl, endotracheal tube 3.7 cm above the carina, and left large bore chest tube are all unchanged. No pneumothorax on the left. No pleural effusion. Focal consolidation within the left mid lung zone peripherally appears similar to multiple prior examinations. Extensive subcutaneous gas within the chest wall has progressed in the interval since prior examination. IMPRESSION: Interval right chest tube placement with evacuation of right pneumothorax. Increasing diffuse body wall subcutaneous gas. Otherwise stable  support lines and tubes. Stable left peripheral mid lung zone consolidation, likely infectious or inflammatory. Electronically  Signed   By: Helyn Numbers MD   On: 04/02/2020 23:09   DG Chest Port 1 View  Result Date: 04/02/2020 CLINICAL DATA:  44 year old male with desaturation. EXAM: PORTABLE CHEST 1 VIEW COMPARISON:  Chest radiograph dated 04/02/2020. FINDINGS: There is a large right pneumothorax (greater than 50%). There is complete collapse of the right lung with shift of the mediastinum into the left hemithorax concerning for a degree of tension. The left chest tube is in similar position. The tip of the right chest tube is not to the left of the midline. Endotracheal tube remains above the carina and enteric tube in the proximal stomach. Right IJ central venous line again noted. There is extensive subcutaneous and chest wall emphysema. IMPRESSION: 1. Large right pneumothorax, new since the earlier radiograph, with shift of the mediastinum into the left hemithorax concerning for tension. 2. Bilateral chest tubes. These results were called by telephone at the time of interpretation on 04/02/2020 at 8:27 pm to nurse Juanetta Gosling, who verbally acknowledged these results. Electronically Signed   By: Elgie Collard M.D.   On: 04/02/2020 20:31   DG CHEST PORT 1 VIEW  Result Date: 04/02/2020 CLINICAL DATA:  New left chest tube. EXAM: PORTABLE CHEST 1 VIEW COMPARISON:  Chest radiograph earlier same date FINDINGS: ET tube mid trachea. Enteric tube courses inferior to the diaphragm. Right IJ central venous catheter tip projects over the superior vena cava. Bilateral chest tubes in place. Monitoring leads overlie the patient. Stable cardiac and mediastinal contours. No definite residual pneumothorax bilaterally. Redemonstrated patchy bilateral airspace opacities. Extensive subcutaneous emphysema. IMPRESSION: 1. Bilateral chest tubes in place. No definite residual pneumothorax bilaterally. 2. Redemonstrated patchy  bilateral airspace opacities. 3. Extensive subcutaneous emphysema. Electronically Signed   By: Annia Belt M.D.   On: 04/02/2020 14:48   DG CHEST PORT 1 VIEW  Result Date: 04/02/2020 CLINICAL DATA:  Right pneumothorax. EXAM: PORTABLE CHEST 1 VIEW COMPARISON:  Chest radiograph 04/02/2020. FINDINGS: ET tube mid trachea. Enteric tube courses inferior to the diaphragm. Right IJ central venous catheter tip projects over the superior vena cava. Interval insertion right chest tube. Monitoring leads overlie the patient. Stable cardiac and mediastinal contours. Similar diffuse bilateral airspace opacities. Possible residual trace left apical pneumothorax. Possible trace right apical pneumothorax. Extensive subcutaneous emphysema overlying the chest. IMPRESSION: 1. Interval insertion right chest tube. 2. Possible trace residual left apical pneumothorax. 3. Possible trace right apical pneumothorax. 4. Similar diffuse bilateral airspace opacities. 5. Similar-appearing subcutaneous emphysema. Electronically Signed   By: Annia Belt M.D.   On: 04/02/2020 12:44   Portable Chest xray  Result Date: 04/02/2020 CLINICAL DATA:  COVID-19.  Shortness of breath. EXAM: PORTABLE CHEST 1 VIEW COMPARISON:  Chest radiograph 04/02/2020. FINDINGS: ETT distal trachea. Enteric tube courses inferior to the diaphragm. Right IJ central venous catheter tip projects over the superior vena cava. Monitoring leads overlie the patient. Stable cardiac and mediastinal contours. Extensive pneumomediastinum. Similar-appearing diffuse bilateral airspace opacities. Persistent small left apical pneumothorax. Persistent small right apical pneumothorax. Small amount of left pleural fluid. Extensive subcutaneous emphysema overlying the chest, similar to prior. IMPRESSION: 1. Similar-appearing diffuse bilateral airspace opacities. 2. Persistent small left apical pneumothorax. Persistent small right apical pneumothorax. 3. Stable support apparatus. 4.  Redemonstrated pneumomediastinum and extensive subcutaneous emphysema. Electronically Signed   By: Annia Belt M.D.   On: 04/02/2020 10:35   DG Chest Port 1 View  Result Date: 04/02/2020 CLINICAL DATA:  Lines and tubes EXAM: PORTABLE CHEST 1 VIEW COMPARISON:  04/01/2020 FINDINGS: The endotracheal tube is stable. The central venous catheter is stable. The enteric tube now extends below the left hemidiaphragm. There is a stable small right-sided pneumothorax. Extensive subcutaneous emphysema and pneumomediastinum is again noted. This appears to have worsened since the prior study. There is a questionable trace left apical pneumothorax. Diffuse bilateral hazy airspace opacities are again noted. IMPRESSION: 1. Lines and tubes as above. 2. Worsening subcutaneous emphysema and pneumomediastinum. 3. Persistent bilateral hazy airspace opacities, not significantly changed. 4. Stable small right-sided pneumothorax. Questionable trace left apical pneumothorax. Electronically Signed   By: Katherine Mantle M.D.   On: 04/02/2020 00:36   DG CHEST PORT 1 VIEW  Result Date: 04/01/2020 CLINICAL DATA:  Endotracheal tube positioning. EXAM: PORTABLE CHEST 1 VIEW COMPARISON:  04/01/2020 FINDINGS: The endotracheal tube terminates above the carina by approximately 2.6 cm. There is a new right-sided central venous catheter with tip projecting the expected region of the cavoatrial junction. There is a stable small right-sided pneumothorax. Pneumomediastinum and subcutaneous gas is again noted. The OG tube appears to be coiled within the hypopharynx. Persistent diffuse bilateral hazy airspace opacities are again noted. There is no acute osseous abnormality. IMPRESSION: 1. New right-sided central venous catheter as above. 2. The OG tube appears to be coiled within the hypopharynx. Repositioning is recommended. 3. Improved positioning of the endotracheal tube. 4. Otherwise, no significant interval change with a persistent small  right-sided pneumothorax. These results will be called to the ordering clinician or representative by the Radiologist Assistant, and communication documented in the PACS or [employer]. Electronically Signed   By: Katherine Mantle M.D.   On: 04/01/2020 21:21   Portable Chest x-ray  Result Date: 04/01/2020 CLINICAL DATA:  Endotracheal tube placement EXAM: PORTABLE CHEST 1 VIEW COMPARISON:  04/01/2020 FINDINGS: The endotracheal tube terminates at the level of the carina. The tube should be retracted by approximately 1 cm. There is a persistent small right-sided pneumothorax. Pneumomediastinum and subcutaneous emphysema is again noted. There is new gaseous distention of the stomach. There are proved lung volumes with persistent bilateral hazy airspace opacities. There is no left-sided pneumothorax. No convincing pleural effusion. IMPRESSION: 1. Endotracheal tube terminates at the level of the carina. The tube should be retracted by approximately 1 cm. 2. Persistent small right-sided pneumothorax and pneumomediastinum. 3. Improved lung volumes with persistent bilateral hazy airspace opacities. These results will be called to the ordering clinician or representative by the Radiologist Assistant, and communication documented in the PACS or [employer]. Electronically Signed   By: Katherine Mantle M.D.   On: 04/01/2020 19:33   DG CHEST PORT 1 VIEW  Result Date: 04/01/2020 CLINICAL DATA:  Pneumonia. EXAM: PORTABLE CHEST 1 VIEW COMPARISON:  04/01/2020 FINDINGS: There is a persistent small right-sided pneumothorax which appears unchanged from prior study. There is pneumomediastinum with extensive subcutaneous emphysema. The lung volumes are low. Diffuse hazy bilateral airspace opacities are again noted. The heart size is stable from prior study. IMPRESSION: 1. Stable small right-sided pneumothorax. 2. Persistent pneumomediastinum and subcutaneous emphysema. 3. Persistent unchanged hazy bilateral airspace  opacities. 4. Low lung volumes. Electronically Signed   By: Katherine Mantle M.D.   On: 04/01/2020 19:01    Review of Systems  Unable to perform ROS: Intubated   Blood pressure 117/64, pulse 100, temperature 99.3 F (37.4 C), temperature source Core (Comment), resp. rate (!) 35, height  (1.803 m), weight 89 kg, SpO2 95 %. Physical Exam Constitutional:      Appearance: Normal appearance.  He is normal weight.     Interventions: He is sedated and intubated.  HENT:     Head: Atraumatic.  Neck:     Trachea: Trachea normal.  Cardiovascular:     Rate and Rhythm: Normal rate and regular rhythm.     Heart sounds: Normal heart sounds.  Pulmonary:     Effort: He is intubated.     Breath sounds: Rhonchi and rales present.  Abdominal:     General: Abdomen is flat.  Neurological:     Mental Status: He is unresponsive.     Assessment/Plan:  While patient is a suitable candidate for ECMO at the present time his gas exchange is acceptable with room to increase and with option of NMB and prone ventilation which should be more successful with additional chest tube.   Pneumothorax appears to be adequately managed with current chest tubes with no evidence of broncho-pleural fistula which would justify ECMO.   Will accept to 79M. TEE for RV assessment as poor TTE windows, although unlikely to have RV dysfunction given no pressor requirement.   Low threshold to proceed with ECMO given favorable RESP score suggesting high chance of salvage.   CRITICAL CARE Performed by: Lynnell Catalan   Total critical care time: 60 minutes  Critical care time was exclusive of separately billable procedures and treating other patients.  Critical care was necessary to treat or prevent imminent or life-threatening deterioration.  Critical care was time spent personally by me on the following activities: development of treatment plan with patient and/or surrogate as well as nursing, discussions with  consultants, evaluation of patient's response to treatment, examination of patient, obtaining history from patient or surrogate, ordering and performing treatments and interventions, ordering and review of laboratory studies, ordering and review of radiographic studies, pulse oximetry, re-evaluation of patient's condition and participation in multidisciplinary rounds.  Lynnell Catalan, MD [facility] ICU Physician [facility]  Pager: [phone] Mobile: [phone] After hours: [phone].

## 2020-04-03 NOTE — TOC Initial Note (Signed)
Transition of Care [facility]) - Initial/Assessment Note    Patient Details  Name: Dennis Howe [name]: [id number] Date of Birth: [birth year]  Transition of [facility]) CM/SW Contact:    Golda Acre, RN Phone Number: 04/03/2020, 7:44 AM  Clinical Narrative:                 10/2 hospital admission 10/8 subcutaneous air in neck and?  Right apical pneumothorax versus artifact 10/9 intubated due to prolonged desaturation to 70s without recovery 10/10-rt chest tube insertion due to pneumothorax. Following for progression and toc needs. Unable to access at this point as to if will need snf, ltac or hhc Expected Discharge Plan: Home w Home Health Services Barriers to Discharge: Barriers Unresolved (comment)   Patient Goals and CMS Choice Patient states their goals for this hospitalization and ongoing recovery are:: unable to state due to vent CMS Medicare.gov Compare Post Acute Care list provided to:: Patient    Expected Discharge Plan and Services Expected Discharge Plan: Home w Home Health Services   Discharge Planning Services: CM Consult   Living arrangements for the past 2 months: Single Family Home                                      Prior Living Arrangements/Services Living arrangements for the past 2 months: Single Family Home Lives with:: Spouse Patient language and need for interpreter reviewed:: No        Need for Family Participation in Patient Care: Yes (Comment) Care giver support system in place?: Yes (comment)   Criminal Activity/Legal Involvement Pertinent to Current Situation/Hospitalization: No - Comment as needed  Activities of Daily Living Home Assistive Devices/Equipment: None ADL Screening (condition at time of admission) Patient's cognitive ability adequate to safely complete daily activities?: Yes Is the patient deaf or have difficulty hearing?: No Does the patient have difficulty seeing, even when wearing glasses/contacts?: No Does  the patient have difficulty concentrating, remembering, or making decisions?: No Patient able to express need for assistance with ADLs?: Yes Does the patient have difficulty dressing or bathing?: No Independently performs ADLs?: Yes (appropriate for developmental age) Does the patient have difficulty walking or climbing stairs?: No Weakness of Legs: None Weakness of Arms/Hands: None  Permission Sought/Granted                  Emotional Assessment Appearance:: Appears stated age Attitude/Demeanor/Rapport: Unable to Assess Affect (typically observed): Unable to Assess Orientation: : Fluctuating Orientation (Suspected and/or reported Sundowners) Alcohol / Substance Use: Not Applicable Psych Involvement: No (comment)  Admission diagnosis:  Acute hypoxemic respiratory failure due to COVID-19 (HCC) [U07.1, J96.01] Patient Active Problem List   Diagnosis Date Noted  . Pneumothorax on left   . Acute hypoxemic respiratory failure due to COVID-19 (HCC) 03/25/2020  . Chest pain 03/25/2020  . Elevated LFTs 03/25/2020  . BMI 30.0-30.9,adult    PCP:  Pcp, No Pharmacy:   [id number] - [place], [place] - [address] AT [address]  [place] [address] Phone: [phone] Fax: [phone]     Social Determinants of Health (SDOH) Interventions    Readmission Risk Interventions No flowsheet data found.

## 2020-04-03 NOTE — Progress Notes (Signed)
eLink Physician-Brief Progress Note Patient Name: Dennis Howe [name]: December 24, 1975 [name]: [id number]   Date of Service  04/03/2020  HPI/Events of Note  Nursing does not feel that sedation is adequate for pronation. Currently on Fentanyl and Versed IV infusions.   eICU Interventions  Plan: 1. Increase ceiling on Fentanyl IV infusion to 400 mcg/hour. 2. Increase ceiling on Versed IV infusion to 12 mg/hour.  2. Increase Versed 1-2 mg IV bolus from infusion to Q 1 hour.     Intervention Category Major Interventions: Delirium, psychosis, severe agitation - evaluation and management  Cordella Nyquist Eugene 04/03/2020, 8:29 PM

## 2020-04-03 NOTE — Consult Note (Signed)
Advanced Heart Failure Team Consult Note   Primary Physician: Pcp, No PCP-Cardiologist:  No primary care provider on file.  Reason for Consultation: Respiratory failure - possible ECMO   HPI:    Dennis Howe is seen today for evaluation of respiratory failure at the request of Dr. Abigail Butts.   44 y/o male with no significant PMHx who tested positive for COVID 19 at an outside facility on 03/21/20  His COVID 19 symptoms started on 9/25 and include fever, congestion, productive cough, vomiting, diarrhea, loss of taste and smell, and generalized malaise. He was recently seen in the ED on 9/30 for nausea and vomiting and was sent home in stable condition with conservative treatments. He was referred to the infusion center on 10/2 and was found to be hypoxic 87% on room air and was sent to the ED for evaluation.    Was on HHFNC O2 for 5 days then intubated on 10/9 and bilateral CTs placed for PTx. Has continued to be hypoxic and failed proning due to PTX.   Today transferred from [facility] to [facility] for consideration of ECMO  Has completed remdesivir and actemara. Currently on steroids  Intubated/sedated   Vent FiO2 60%  Sats 89-92%  ABG 7.31/59/68/91%   ECHO 03/26/2020: EF 60-65% RV normal. Personally reviewed   Review of Systems: unavailable due to intubation   albuterol  2.5 mg Nebulization Q6H   artificial tears  1 application Both Eyes J9E   vitamin C  500 mg Per Tube Daily   chlorhexidine gluconate (MEDLINE KIT)  15 mL Mouth Rinse BID   Chlorhexidine Gluconate Cloth  6 each Topical Daily   docusate  100 mg Per Tube BID   enoxaparin (LOVENOX) injection  40 mg Subcutaneous Q24H   feeding supplement (PROSource TF)  90 mL Per Tube QID   insulin aspart  0-15 Units Subcutaneous Q4H   mouth rinse  15 mL Mouth Rinse 10 times per day   methylPREDNISolone (SOLU-MEDROL) injection  50 mg Intravenous Q12H   pantoprazole sodium  40 mg Per Tube Q1200   polyethylene glycol   17 g Per Tube Daily   senna-docusate  2 tablet Per Tube QHS   sodium chloride flush  10-40 mL Intracatheter Q12H   traZODone  50 mg Per Tube QHS   zinc sulfate  220 mg Per Tube Daily    ceFEPime (MAXIPIME) IV Stopped (04/03/20 1354)   feeding supplement (NEPRO CARB STEADY) 1,000 mL (04/03/20 1614)   fentaNYL infusion INTRAVENOUS 300 mcg/hr (04/03/20 1919)   midazolam 10 mg/hr (04/03/20 1946)   phenylephrine (NEO-SYNEPHRINE) Adult infusion Stopped (04/02/20 0819)   vancomycin      Past Medical History: Past Medical History:  Diagnosis Date   BMI 30.0-30.9,adult     Past Surgical History: Unavailable due to intubation   Family History: Unavailable due to intubation   Social History: Social History   Socioeconomic History   Marital status: Married    Spouse name: Not on file   Number of children: Not on file   Years of education: Not on file   Highest education level: Not on file  Occupational History   Not on file  Tobacco Use   Smoking status: Not on file  Substance and Sexual Activity   Alcohol use: Not on file   Drug use: Not on file   Sexual activity: Not on file  Other Topics Concern   Not on file  Social History Narrative   Not on  file   Social Determinants of Health   Financial Resource Strain:    Difficulty of Paying Living Expenses: Not on file  Food Insecurity:    Worried About Charity fundraiser in the Last Year: Not on file   [employer] of Food in the Last Year: Not on file  Transportation Needs:    Lack of Transportation (Medical): Not on file   Lack of Transportation (Non-Medical): Not on file  Physical Activity:    Days of Exercise per Week: Not on file   Minutes of Exercise per Session: Not on file  Stress:    Feeling of Stress : Not on file  Social Connections:    Frequency of Communication with Friends and Family: Not on file   Frequency of Social Gatherings with Friends and Family: Not on file   Attends  Religious Services: Not on file   Active Member of Clubs or Organizations: Not on file   Attends Archivist Meetings: Not on file   Marital Status: Not on file    Allergies:  No Known Allergies  Objective:    Vital Signs:   Temp:  [97.7 F (36.5 C)-100.4 F (38 C)] 97.7 F (36.5 C) (10/11 1946) Pulse Rate:  [88-123] 91 (10/11 1845) Resp:  [23-36] 36 (10/11 1845) BP: (98-128)/(51-80) 98/71 (10/11 1845) SpO2:  [84 %-100 %] 89 % (10/11 1845) FiO2 (%):  [60 %-90 %] 60 % (10/11 1845) Weight:  [89 kg] 89 kg (10/11 0500) Last BM Date: 03/25/20  Weight change: Filed Weights   04/01/20 0500 04/02/20 0500 04/03/20 0500  Weight: 86.9 kg 86.8 kg 89 kg    Intake/Output:   Intake/Output Summary (Last 24 hours) at 04/03/2020 2011 Last data filed at 04/03/2020 1800 Gross per 24 hour  Intake 1948.47 ml  Output 1050 ml  Net 898.47 ml      Physical Exam    General:  Intubated/sedated HEENT: normal + ETT Neck: supple. JVP har to assess Cor: PMI nondisplaced. Regular rate & rhythm. No rubs, gallops or murmurs. Lungs: coarse + subcu air Abdomen: soft, nontender, nondistended. No hepatosplenomegaly. No bruits or masses. Good bowel sounds. Extremities: no cyanosis, clubbing, rash, edema Neuro:  Intubated/sedated  Telemetry   Sinus 90-100 Personally reviewed   EKG    Sinus 74 nonspecific T wave abnormalities Personally reviewed   Labs   Basic Metabolic Panel: Recent Labs  Lab 03/31/20 0253 03/31/20 0253 04/01/20 0525 04/01/20 0525 04/02/20 0524 04/02/20 0524 04/02/20 1600 04/03/20 0552 04/03/20 1625  NA 136   < > 135  --  134*  --  137 137 139  K 4.9   < > 5.0  --  5.9*  --  5.5* 5.8* 5.4*  CL 102   < > 103  --  99  --  103 103 105  CO2 20*   < > 21*  --  23  --  _0 GLUCOSE 173*   < > 169*  --  178*  --  185* 192* 186*  BUN 32*   < > 35*  --  39*  --  39* 38* 41*  CREATININE 0.72   < > 0.71  --  1.10  --  0.84 0.85 0.73  CALCIUM 8.5*   < >  8.4*   < > 8.2*   < > 7.8* 7.8* 7.8*  MG 2.6*  --  2.7*  --  3.1*  --   --  3.2*  --  PHOS 4.6  --  4.9*  --  6.1*  --   --  4.2  --    < > = values in this interval not displayed.    Liver Function Tests: Recent Labs  Lab 03/29/20 0440 03/30/20 0436 03/31/20 0253 04/01/20 0525 04/02/20 0524  AST _0 ALT 95* 75* 70* 60* 51*  ALKPHOS 109 107 118 108 108  BILITOT 0.8 0.8 1.0 1.1 1.4*  PROT 6.7 6.6 7.2 6.7 7.0  ALBUMIN 3.0* 3.0* 3.3* 3.4* 3.4*   No results for input(s): LIPASE, AMYLASE in the last 168 hours. No results for input(s): AMMONIA in the last 168 hours.  CBC: Recent Labs  Lab 03/28/20 0522 03/28/20 0522 03/29/20 0440 03/29/20 0440 03/30/20 0436 03/31/20 0253 04/01/20 0525 04/02/20 0524 04/03/20 0552  WBC 11.5*   < > 12.6*   < > 9.8 10.4 11.5* 17.6* 12.9*  NEUTROABS 10.4*  --  11.5*  --  8.6* 9.3*  --  16.4*  --   HGB 15.5   < > 15.3   < > 15.9 17.0 17.6* 18.6* 16.0  HCT 44.9   < > 44.9   < > 47.0 49.7 50.7 55.5* 47.7  MCV 88.0   < > 88.2   < > 88.8 87.7 86.8 89.2 91.2  PLT 307   < > 325   < > 390 423* 433* 316 221   < > = values in this interval not displayed.    Cardiac Enzymes: No results for input(s): CKTOTAL, CKMB, CKMBINDEX, TROPONINI in the last 168 hours.  BNP: BNP (last 3 results) Recent Labs    03/30/20 0436  BNP 103.0*    ProBNP (last 3 results) No results for input(s): PROBNP in the last 8760 hours.   CBG: Recent Labs  Lab 04/02/20 2010 04/02/20 2323 04/03/20 0547 04/03/20 0835 04/03/20 1946  GLUCAP 199* 186* 169* 195* 160*    Coagulation Studies: No results for input(s): LABPROT, INR in the last 72 hours.   Imaging   DG CHEST PORT 1 VIEW  Result Date: 04/03/2020 CLINICAL DATA:  Chest tube in place.  COVID-19. EXAM: PORTABLE CHEST 1 VIEW COMPARISON:  04/02/2020 FINDINGS: The patient is mildly rotated to the left. Endotracheal tube terminates near the inferior margin of the clavicular heads, well above the  carina. The more superiorly located right chest tube has been advanced and now terminates over the right lung apex. The second right chest tube and the left chest tube are unchanged. No definite pneumothorax is identified. Extensive subcutaneous emphysema remains throughout the chest wall and lower neck bilaterally, improved in the right chest wall since the prior study. There are persistent asymmetric patchy airspace opacities in the left mid and lower lung. No sizable pleural effusion is identified. IMPRESSION: 1. Interval advancement of one of the right chest tube. No definite pneumothorax. 2. Extensive subcutaneous emphysema, decreased in the right chest wall. 3. Persistent asymmetric left lung airspace disease. Electronically Signed   By: Logan Bores M.D.   On: 04/03/2020 04:46   DG CHEST PORT 1 VIEW  Result Date: 04/02/2020 CLINICAL DATA:  Respiratory failure, pneumothorax EXAM: PORTABLE CHEST 1 VIEW COMPARISON:  7:46 p.m. FINDINGS: Since the prior examination, a second chest tube has been placed within the a right mid lung zone and large right pneumothorax noted on prior examination has been completely evacuated. Pre-existing right chest tube crosses the midline anteriorly. Right internal jugular central venous catheter with its tip within the  superior vena cava, nasogastric tube with its tip overlying the expected proximal body of the stomachl, endotracheal tube 3.7 cm above the carina, and left large bore chest tube are all unchanged. No pneumothorax on the left. No pleural effusion. Focal consolidation within the left mid lung zone peripherally appears similar to multiple prior examinations. Extensive subcutaneous gas within the chest wall has progressed in the interval since prior examination. IMPRESSION: Interval right chest tube placement with evacuation of right pneumothorax. Increasing diffuse body wall subcutaneous gas. Otherwise stable support lines and tubes. Stable left peripheral mid lung  zone consolidation, likely infectious or inflammatory. Electronically Signed   By: Fidela Salisbury MD   On: 04/02/2020 23:09      Medications:     Current Medications:  albuterol  2.5 mg Nebulization Q6H   artificial tears  1 application Both Eyes J0K   vitamin C  500 mg Per Tube Daily   chlorhexidine gluconate (MEDLINE KIT)  15 mL Mouth Rinse BID   Chlorhexidine Gluconate Cloth  6 each Topical Daily   docusate  100 mg Per Tube BID   enoxaparin (LOVENOX) injection  40 mg Subcutaneous Q24H   feeding supplement (PROSource TF)  90 mL Per Tube QID   insulin aspart  0-15 Units Subcutaneous Q4H   mouth rinse  15 mL Mouth Rinse 10 times per day   methylPREDNISolone (SOLU-MEDROL) injection  50 mg Intravenous Q12H   pantoprazole sodium  40 mg Per Tube Q1200   polyethylene glycol  17 g Per Tube Daily   senna-docusate  2 tablet Per Tube QHS   sodium chloride flush  10-40 mL Intracatheter Q12H   traZODone  50 mg Per Tube QHS   zinc sulfate  220 mg Per Tube Daily     Infusions:  ceFEPime (MAXIPIME) IV Stopped (04/03/20 1354)   feeding supplement (NEPRO CARB STEADY) 1,000 mL (04/03/20 1614)   fentaNYL infusion INTRAVENOUS 300 mcg/hr (04/03/20 1919)   midazolam 10 mg/hr (04/03/20 1946)   phenylephrine (NEO-SYNEPHRINE) Adult infusion Stopped (04/02/20 0819)   vancomycin        Assessment/Plan   1. Acute hypoxic respiratory failure due to XFGHW-29 PNA - complicated by bilateral PTXs - echo 03/26/20 EF 60-65% RV normal  - RESP score 3 - Agree with Dr. Lynetta Mare that he would be suitable candidate for VV ECMO via RIJ approach with [facility] candidate if his condition continues to worsen. Billey Gosling follow closely to assess response to proning and paralytics - Hemodynamically stable without vasopressors with normal echo on admit so doube the will need RV support with Protek configuration.   2. COVID-19 infection/PNA - Has completed remdesivir and actemara. Currently on  steroids - On vanc/cefipime  CRITICAL CARE Performed by: Glori Bickers  Total critical care time: 45 minutes  Critical care time was exclusive of separately billable procedures and treating other patients.  Critical care was necessary to treat or prevent imminent or life-threatening deterioration.  Critical care was time spent personally by me (independent of midlevel providers or residents) on the following activities: development of treatment plan with patient and/or surrogate as well as nursing, discussions with consultants, evaluation of patient's response to treatment, examination of patient, obtaining history from patient or surrogate, ordering and performing treatments and interventions, ordering and review of laboratory studies, ordering and review of radiographic studies, pulse oximetry and re-evaluation of patient's condition.    Length of Stay: [place], MD  04/03/2020, 8:11 PM  Advanced Heart Failure Team Pager [phone] (  M-F; 7a - 4p)  Please contact [facility] for night-coverage after hours (4p -7a ) and weekends on [email or web address]

## 2020-04-03 NOTE — Progress Notes (Addendum)
Initial Nutrition Assessment  DOCUMENTATION CODES:   Not applicable  INTERVENTION:  - will adjust TF: Nepro @ 45 ml/hr with 90 ml Prosource TF QID. - this regimen will provide 2264 kcal, 175 grams protein, and 785 ml free water.  - free water flush, if desired, to be per CCM.    NUTRITION DIAGNOSIS:   Increased nutrient needs related to acute illness, catabolic illness (COVID-19 infection) as evidenced by estimated needs.  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 He has received remdesivir course and received actemra on 10/3 and 10/6. He has no known medical history.  Patient was intubated on 10/9 at ~1930 and OGT placed at that time. Abdominal xray report states that the tip of tube is in the gastric body.   Patient was started on Vital High Protein @ 20 ml/hr yesterday. This regimen provides 480 kcal, 42 grams protein, and 401 ml free water.   He was previously ordered a Regular diet (10/2-10/9) and was mainly eating 50-100% of meals. Noted no BM has been documented since 10/2 (date of admission); aggressive bowel regimen order is in place.   Able to talk with RN who reports no issues with TF and no plan to re-prone today d/t chest tubes.   Patient discussed in rounds this AM. Worsening pneumo so third chest tube placed 10/10.  Weight today is 196 lb, weight 10/10 and 10/9 was 191 lb, weight on 10/8 and 10/7 was 218 lb, and weight on admission (10/2) was 215 lb. Used weight from today to estimate needs. Today is day #5 in the ICU.   Per notes: - ARDS 2/2 COVID-19 PNA - bilateral pneumos - AKI - steroid-induced hyperglycemia   Patient is currently  intubated on ventilator support MV: 13.7 L/min Temp (24hrs), Avg:99.2 F (37.3 C), Min:98 F (36.7 C), Max:100.4 F (38 C) Propofol: none  Labs reviewed; CBGs: 169 and 195 mg/dl, K: 5.8 mmol/l, BUN: 38 mg/dl, Ca: 7.8 mg/dl, Mg: 3.2 mg/dl, Phos WDL. Medications reviewed; 500 mg vitamin C/day, 100 mg colace BID, sliding scale novolog, 50 mg solu-medrol BID, 40 mg protonix/day, 17 g miralax/day, 2 tablets senokot/day, 10 g lokelma/day, 220 mg zinc sulfate/day.  Drips; versed @ 7.5 mg/hr, fentanyl @ 300 mcg/hr,     NUTRITION - FOCUSED PHYSICAL EXAM:  not completed at this time.   Diet Order:   Diet Order            Diet NPO time specified  Diet effective now                 EDUCATION NEEDS:   No education needs have been identified at this time  Skin:  Skin Assessment: Reviewed RN Assessment  Last BM:  10/2 (date of admission)  Height:   Ht Readings from Last 1 Encounters:  03/30/20 5\' 11"  (1.803 m)    Weight:   Wt Readings from Last 1 Encounters:  04/03/20 89 kg     Estimated Nutritional Needs:  Kcal:  2225 kcal (25 kcal/kg) Protein:  160-178 grams (1.8-2 grams/kg) Fluid:  >/= 2.8 L/day      06/03/20, MS, RD, LDN, CNSC Inpatient Clinical Dietitian RD pager # available in  AMION  After hours/weekend pager # available in [facility]

## 2020-04-03 NOTE — Progress Notes (Signed)
Head turned at this time, no complications noted.  

## 2020-04-03 NOTE — Progress Notes (Signed)
Case discussed with Dr. Isaiah Serge. Will bring to 18M for formal ECMO evaluation and a re-trial of proning, if PTX does not tolerate or ventilation not improving with this, will consider VV ECMO cannulation. Formal consult to be done by either Dr. Denese Killings or myself.  Myrla Halsted MD PCCM

## 2020-04-03 NOTE — Progress Notes (Addendum)
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  Treated with steroids and remdesivir. Received Actemra 10/3 & 10/6 .  Due to rising D-dimer lower extremity ultrasound and echo was obtained.  PCCM consulted 10/8  Past Medical History  None  Significant Hospital Events   10/2 hospital admission 10/8 subcutaneous air in neck and?  Right apical pneumothorax versus artifact 10/9 intubated due to prolonged desaturation to 70s without recovery 10/10 1 chest tube on left and 2 chest tubes placed on right due to recurrent ptx  Consults:    Procedures:  ETT 10/9 >> Right IJ CVL 10/9 >> Right chest tube x2 10/10>> Lt Chest tube 10/10>>  Significant Diagnostic Tests:  Chest x-ray 10/8 subcutaneous air in neck,?  Right apical pneumothorax 10/7 venous duplex negative BLE 10/3 echo [id number]  Micro Data:  10/2 Bc >> ng 10/9 resp >> GPC, GNR  Antimicrobials:   10/9 cefepime >> 10/9 vancomycin >>  Interim history/subjective:   Had multiple chest tubes placed overnight due to recurrent pneumothorax. Prone overnight but had to be supinated due to recurrence of pneumothorax  Objective   Blood pressure (!) 105/57, pulse 92, temperature 99.3 F (37.4 C), temperature source Bladder, resp. rate (!) 35, height 5\' 11"  (1.803 m), weight 89 kg, SpO2 96 %.    Vent Mode: PRVC FiO2 (%):  [90 %-100 %] 90 % Set Rate:  [35 bmp] 35 bmp Vt Set:  [450 mL] 450 mL PEEP:  [14 cmH20-16 cmH20] 14 cmH20 Plateau Pressure:  [28 cmH20-36 cmH20] 31 cmH20   Intake/Output Summary (Last 24 hours) at 04/03/2020 0940 Last data filed at  04/03/2020 06/03/2020 Gross per 24 hour  Intake 895.54 ml  Output 1570 ml  Net -674.46 ml   Filed Weights   04/01/20 0500 04/02/20 0500 04/03/20 0500  Weight: 86.9 kg 86.8 kg 89 kg    Examination: Gen:      No acute distress HEENT:  EOMI, sclera anicteric Neck:     No masses; no thyromegaly, ETT Lungs:    Clear to auscultation bilaterally; normal respiratory effort CV:         Regular rate and rhythm; no murmurs Abd:      + bowel sounds; soft, non-tender; no palpable masses, no distension Ext:    No edema; adequate peripheral perfusion Skin:      Warm and dry; no rash Neuro: Sedated  Labs/imaging reviewed Chest x-ray today with 2 right chest tubes and 1 left chest tube.  No evidence of pneumothorax but he has extensive subcu emphysema Labs show potassium elevated to 5.8, WBC improved to 12.9  Tracheal aspirate with GPC and GNR  Resolved Hospital Problem list     Assessment & Plan:  ARDS due to COVID pneumonia Continue low tidal volume ventilation, Target plateau pressure less than 30, target driving pressure less than 15 Follow daily chest x-ray Continue proning if P/F ratio < 150  Bilateral pneumothoraces, pneumomediastinum with subcu emphysema Continue chest tube to suction  COVID-19 pneumonia Has completed remdesivir & received Actemra Continue steroids  HAP Now growing GPC and GNR.  Continue Vanco, cefepime  AKI/hyperkalemia No EKG changes Repeat Lokelma  Steroid-induced hyperglycemia  SSI  Best practice:  Diet: Tube feeds Pain/Anxiety/Delirium protocol (if indicated): fentanyl versed gtt VAP protocol (if indicated): N/A DVT prophylaxis: Lovenox GI prophylaxis: Protonix Glucose control: SSI while on steroids Mobility: Bedrest  Code Status: Full Family Communication: Wife updated in detail on 10/11. Disposition: ICU  Critical care time:   The patient is critically ill with multiple organ system failure and requires high complexity decision making for  assessment and support, frequent evaluation and titration of therapies, advanced monitoring, review of radiographic studies and interpretation of complex data.   Critical Care Time devoted to patient care services, exclusive of separately billable procedures, described in this note is 35 minutes.   Chilton Greathouse MD [facility] Please see [email or web address] for pager details.  04/03/2020, 9:52 AM

## 2020-04-03 NOTE — Progress Notes (Signed)
1846: patient arrived to unit from carelink, sedation minimal-patient able to nod appropriately to questions. Pt transferred to bed with no complications, CT x 3 intact-placed to wall suction at -20cm. Vent and ETT controlled by RT at bedside. Prepped patient for proning, increased sedation PRN vec at bedside.   1915: Dr. Denese Killings called to clarify- wait for clearance from Dr. Gala Romney to prone, may want TEE completed prior to flipping. Night shift RN aware Maurice March), report given. VSS

## 2020-04-03 NOTE — Progress Notes (Signed)
eLink Physician-Brief Progress Note Patient Name: Dennis Howe [name]: 07/19/1975 [name]: [id number]   Date of Service  04/03/2020  HPI/Events of Note  Frequent liquid stools. Patient proned. Nursing request for Flexiseal.  eICU Interventions  Plan: 1. Place Flexiseal.      Intervention Category Major Interventions: Other:  Remingtyn Depaola Dennard Nip 04/03/2020, 10:00 PM

## 2020-04-03 NOTE — Progress Notes (Signed)
Pharmacy Antibiotic Note  Dennis Howe Chales Salmon is a 44 y.o. male admitted on 03/25/2020.  Pharmacy has been consulted for Vancomycin and Cefepime dosing for pneumonia  Today, 04/03/2020 SCr increased yesterday to 1.1, reduced again today, will re-calculate Vancomycin dose Respiratory cx from 10/9 with few GPC, rare GNR  Plan: Cefepime 2g IV q8h Vancomycin 1500 mg x1, followed by 1500mg  q12 Follow up renal function, culture results, and clinical course.  Height: 5\' 11"  (180.3 cm) Weight: 89 kg (196 lb 3.4 oz) IBW/kg (Calculated) : 75.3  Temp (24hrs), Avg:99.1 F (37.3 C), Min:98 F (36.7 C), Max:100.4 F (38 C)  Recent Labs  Lab 03/30/20 0436 03/30/20 0436 03/31/20 0253 04/01/20 0525 04/02/20 0524 04/02/20 1600 04/03/20 0552  WBC 9.8  --  10.4 11.5* 17.6*  --  12.9*  CREATININE 0.70   < > 0.72 0.71 1.10 0.84 0.85   < > = values in this interval not displayed.    Estimated Creatinine Clearance: 120.6 mL/min (by C-G formula based on SCr of 0.85 mg/dL).   No Known Allergies   Antimicrobials this admission:  10/2 Remdesivir >> 10/6 10/3 Actemra x1 10/6 Actemra x1 (second dose) 10/9 Vancomycin >>  10/9 Cefepime >>   Dose adjustments this admission:  10/10 Vanc 1gm q8 > 1250mg  q12 10/11 Vanc 1250mg  q12 > 1500mg  q12  Microbiology results:  10/2 Influenza A/B: neg; Covid positive 10/2 BC: NGF 10/7 MRSA PCR: negative 10/9 Trach asp: few GPC, rare GNR   Thank you for allowing pharmacy to be a part of this patient's care.  PharmD 04/03/2020, 12:30 PM

## 2020-04-04 ENCOUNTER — Inpatient Hospital Stay (HOSPITAL_COMMUNITY): Payer: 59

## 2020-04-04 DIAGNOSIS — J9601 Acute respiratory failure with hypoxia: Secondary | ICD-10-CM | POA: Diagnosis not present

## 2020-04-04 DIAGNOSIS — U071 COVID-19: Secondary | ICD-10-CM | POA: Diagnosis not present

## 2020-04-04 LAB — POCT I-STAT 7, (LYTES, BLD GAS, ICA,H+H)
Acid-Base Excess: 0 mmol/L (ref 0.0–2.0)
Acid-Base Excess: 3 mmol/L — ABNORMAL HIGH (ref 0.0–2.0)
Bicarbonate: 26.9 mmol/L (ref 20.0–28.0)
Bicarbonate: 28.9 mmol/L — ABNORMAL HIGH (ref 20.0–28.0)
Calcium, Ion: 1.16 mmol/L (ref 1.15–1.40)
Calcium, Ion: 1.2 mmol/L (ref 1.15–1.40)
HCT: 38 % — ABNORMAL LOW (ref 39.0–52.0)
HCT: 37 % — ABNORMAL LOW (ref 39.0–52.0)
Hemoglobin: 12.9 g/dL — ABNORMAL LOW (ref 13.0–17.0)
Hemoglobin: 12.6 g/dL — ABNORMAL LOW (ref 13.0–17.0)
O2 Saturation: 93 %
O2 Saturation: 95 %
Patient temperature: 98.9
Patient temperature: 37.1
Potassium: 4.6 mmol/L (ref 3.5–5.1)
Potassium: 4.6 mmol/L (ref 3.5–5.1)
Sodium: 139 mmol/L (ref 135–145)
Sodium: 140 mmol/L (ref 135–145)
TCO2: 28 mmol/L (ref 22–32)
TCO2: 30 mmol/L (ref 22–32)
pCO2 arterial: 50.1 mmHg — ABNORMAL HIGH (ref 32.0–48.0)
pCO2 arterial: 51.4 mmHg — ABNORMAL HIGH (ref 32.0–48.0)
pH, Arterial: 7.339 — ABNORMAL LOW (ref 7.350–7.450)
pH, Arterial: 7.358 (ref 7.350–7.450)
pO2, Arterial: 73 mmHg — ABNORMAL LOW (ref 83.0–108.0)
pO2, Arterial: 80 mmHg — ABNORMAL LOW (ref 83.0–108.0)

## 2020-04-04 LAB — CBC WITH DIFFERENTIAL/PLATELET
Abs Immature Granulocytes: 0.07 10*3/uL (ref 0.00–0.07)
Basophils Absolute: 0 10*3/uL (ref 0.0–0.1)
Basophils Relative: 0 %
Eosinophils Absolute: 0 10*3/uL (ref 0.0–0.5)
Eosinophils Relative: 0 %
HCT: 40.4 % (ref 39.0–52.0)
Hemoglobin: 13 g/dL (ref 13.0–17.0)
Immature Granulocytes: 1 %
Lymphocytes Relative: 3 %
Lymphs Abs: 0.3 10*3/uL — ABNORMAL LOW (ref 0.7–4.0)
MCH: 29.5 pg (ref 26.0–34.0)
MCHC: 32.2 g/dL (ref 30.0–36.0)
MCV: 91.6 fL (ref 80.0–100.0)
Monocytes Absolute: 0.2 10*3/uL (ref 0.1–1.0)
Monocytes Relative: 2 %
Neutro Abs: 9.3 10*3/uL — ABNORMAL HIGH (ref 1.7–7.7)
Neutrophils Relative %: 94 %
Platelets: 175 10*3/uL (ref 150–400)
RBC: 4.41 MIL/uL (ref 4.22–5.81)
RDW: 12.1 % (ref 11.5–15.5)
WBC: 9.8 10*3/uL (ref 4.0–10.5)
nRBC: 0 % (ref 0.0–0.2)

## 2020-04-04 LAB — GLUCOSE, CAPILLARY
Glucose-Capillary: 133 mg/dL — ABNORMAL HIGH (ref 70–99)
Glucose-Capillary: 178 mg/dL — ABNORMAL HIGH (ref 70–99)
Glucose-Capillary: 183 mg/dL — ABNORMAL HIGH (ref 70–99)
Glucose-Capillary: 185 mg/dL — ABNORMAL HIGH (ref 70–99)
Glucose-Capillary: 187 mg/dL — ABNORMAL HIGH (ref 70–99)
Glucose-Capillary: 190 mg/dL — ABNORMAL HIGH (ref 70–99)
Glucose-Capillary: 192 mg/dL — ABNORMAL HIGH (ref 70–99)

## 2020-04-04 LAB — BASIC METABOLIC PANEL
Anion gap: 9 (ref 5–15)
BUN: 40 mg/dL — ABNORMAL HIGH (ref 6–20)
CO2: 27 mmol/L (ref 22–32)
Calcium: 7.8 mg/dL — ABNORMAL LOW (ref 8.9–10.3)
Chloride: 104 mmol/L (ref 98–111)
Creatinine, Ser: 0.78 mg/dL (ref 0.61–1.24)
GFR, Estimated: >60 mL/min (ref >60)
Glucose, Bld: 197 mg/dL — ABNORMAL HIGH (ref 70–99)
Potassium: 4.6 mmol/L (ref 3.5–5.1)
Sodium: 140 mmol/L (ref 135–145)

## 2020-04-04 LAB — MAGNESIUM: Magnesium: 3.1 mg/dL — ABNORMAL HIGH (ref 1.7–2.4)

## 2020-04-04 LAB — PHOSPHORUS: Phosphorus: 3.4 mg/dL (ref 2.5–4.6)

## 2020-04-04 MED ORDER — QUETIAPINE FUMARATE 50 MG PO TABS
50.0000 mg | ORAL_TABLET | Freq: Two times a day (BID) | ORAL | Status: DC
  Administered 2020-04-04 – 2020-04-12 (×18): 50 mg
  Filled 2020-04-04 (×19): qty 1

## 2020-04-04 MED ORDER — SODIUM CHLORIDE 0.9 % IV SOLN
2.0000 g | INTRAVENOUS | Status: AC
  Administered 2020-04-04 – 2020-04-10 (×7): 2 g via INTRAVENOUS
  Filled 2020-04-04 (×7): qty 20

## 2020-04-04 MED ORDER — SODIUM CHLORIDE 0.9 % IV SOLN
0.5000 mg/h | INTRAVENOUS | Status: DC
  Administered 2020-04-04: 12 mg/h via INTRAVENOUS
  Administered 2020-04-05: 7.5 mg/h via INTRAVENOUS
  Administered 2020-04-06: 10 mg/h via INTRAVENOUS
  Administered 2020-04-07 – 2020-04-08 (×2): 9 mg/h via INTRAVENOUS
  Administered 2020-04-09: 11 mg/h via INTRAVENOUS
  Administered 2020-04-10: 10 mg/h via INTRAVENOUS
  Administered 2020-04-11: 7 mg/h via INTRAVENOUS
  Administered 2020-04-13: 10 mg/h via INTRAVENOUS
  Administered 2020-04-14: 6 mg/h via INTRAVENOUS
  Administered 2020-04-16: 8 mg/h via INTRAVENOUS
  Administered 2020-04-17: 12 mg/h via INTRAVENOUS
  Administered 2020-04-17: 9.5 mg/h via INTRAVENOUS
  Administered 2020-04-19: 8 mg/h via INTRAVENOUS
  Administered 2020-04-21: 0.5 mg/h via INTRAVENOUS
  Filled 2020-04-04 (×9): qty 50
  Filled 2020-04-04: qty 40
  Filled 2020-04-04 (×12): qty 50

## 2020-04-04 MED ORDER — METHYLPREDNISOLONE SODIUM SUCC 125 MG IJ SOLR
50.0000 mg | INTRAMUSCULAR | Status: DC
  Administered 2020-04-09 – 2020-04-11 (×3): 50 mg via INTRAVENOUS
  Filled 2020-04-04 (×3): qty 2

## 2020-04-04 MED ORDER — OXYCODONE HCL 5 MG/5ML PO SOLN
10.0000 mg | Freq: Four times a day (QID) | ORAL | Status: DC
  Administered 2020-04-04 – 2020-04-13 (×36): 10 mg
  Filled 2020-04-04 (×36): qty 10

## 2020-04-04 MED ORDER — CLONAZEPAM 0.5 MG PO TBDP
2.0000 mg | ORAL_TABLET | Freq: Two times a day (BID) | ORAL | Status: DC
  Administered 2020-04-04 – 2020-04-12 (×18): 2 mg
  Filled 2020-04-04 (×19): qty 4

## 2020-04-04 MED ORDER — METHYLPREDNISOLONE SODIUM SUCC 125 MG IJ SOLR
50.0000 mg | Freq: Two times a day (BID) | INTRAMUSCULAR | Status: AC
  Administered 2020-04-04 – 2020-04-08 (×10): 50 mg via INTRAVENOUS
  Filled 2020-04-04 (×10): qty 2

## 2020-04-04 NOTE — Progress Notes (Signed)
Patient's head turned and arms rotated.

## 2020-04-04 NOTE — Progress Notes (Signed)
Pt head turned at this time, no complications noted. 

## 2020-04-04 NOTE — Progress Notes (Addendum)
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  Treated with steroids and remdesivir. Received Actemra 10/3 & 10/6 .  Due to rising D-dimer lower extremity ultrasound and echo was obtained.  PCCM consulted 10/8  Past Medical History  None  Significant Hospital Events   10/2 hospital admission 10/8 subcutaneous air in neck and?  Right apical pneumothorax versus artifact 10/9 intubated due to prolonged desaturation to 70s without recovery 10/10 1 chest tube on left and 2 chest tubes placed on right due to recurrent ptx 10/11 transferred to [facility] for ECMO consideration 10/11 prone ventilation initiated Consults:    Procedures:  ETT 10/9 >> Right IJ CVL 10/9 >> Right chest tube x2 10/10>> Lt Chest tube 10/10>>  Significant Diagnostic Tests:  Chest x-ray 10/8 subcutaneous air in neck,?  Right apical pneumothorax 10/7 venous duplex negative BLE 10/3 echo nmlLVEF  Micro Data:  10/2 Bc >> ng 10/9 resp >> GPC, GNR -abundant Haemophilus influenzae  Antimicrobials:   10/9 cefepime >> 10/9 vancomycin >>  Interim history/subjective:   Evaluated for ECMO.  Excellent ECMO candidate but standard therapy not yet exhausted.  Initiate prone ventilation last p.m.  Objective   Blood pressure 118/71, pulse 84, temperature 98.4 F (36.9 C), resp. rate (!) 35, height 5\' 11"  (1.803 m), weight 90.3 kg, SpO2 95 %.    Vent Mode: PRVC FiO2 (%):  [50 %-60 %] 50 % Set Rate:  [35 bmp] 35 bmp Vt Set:  [450 mL] 450 mL PEEP:  [14 cmH20] 14 cmH20 Plateau Pressure:  [25 cmH20-30 cmH20] 25 cmH20   Intake/Output  Summary (Last 24 hours) at 04/04/2020 1756 Last data filed at 04/04/2020 1600 Gross per 24 hour  Intake 2613.81 ml  Output 1390 ml  Net 1223.81 ml   Filed Weights   04/02/20 0500 04/03/20 0500 04/04/20 0500  Weight: 86.8 kg 89 kg 90.3 kg    Examination: Gen:      No acute distress, intubated, sedated HEENT: Sclera intact Neck:     ETT, OG tube no skin breakdown. Lungs:    Coarse crackles with lung squeaks bilaterally over back without significant for extension.  3 chest tubes in place with minimal air leak on the right side.  No air leak on the left. CV:         Unable to auscultate due to prone position Abd:   Tolerating tube feeds, unable to palpate due to prone position Ext:    No edema; adequate peripheral perfusion Skin:      Warm and dry; no rash Neuro:  Sedated will open eyes weakly to painful stimulation.  No spontaneous movement.  Resolved Hospital Problem list     Assessment & Plan:   Critically ill due to ARDS due to COVID pneumonia with improvement in oxygenation with prone ventilation. Acceptable airway pressures and gas exchange -Continue current low tidal volume ventilation strategy. -Follow PF ratio to guide further proning.  Bilateral pneumothoraces, pneumomediastinum with subcutaneous emphysema -Continue continue chest tube to suction  COVID-19 pneumonia Has completed remdesivir & received Actemra Continue steroids  Haemophilus influenzae pneumonia Narrow antibiotics to ceftriaxone for 7 days.  AKI/hyperkalemia No EKG changes  Repeat Lokelma  Steroid-induced hyperglycemia SSI  Best practice:  Diet: Tube feeds Pain/Anxiety/Delirium protocol (if indicated): fentanyl versed gtt VAP protocol (if indicated): N/A DVT prophylaxis: Lovenox GI prophylaxis: Protonix Glucose control: SSI while on steroids Mobility: Bedrest  Code Status: Full Family Communication: Wife updated in detail on 10/11. Disposition: ICU  CRITICAL CARE Performed by: Lynnell Catalan   Total critical care time: 40 minutes  Critical care time was exclusive of separately billable procedures and treating other patients.  Critical care was necessary to treat or prevent imminent or life-threatening deterioration.  Critical care was time spent personally by me on the following activities: development of treatment plan with patient and/or surrogate as well as nursing, discussions with consultants, evaluation of patient's response to treatment, examination of patient, obtaining history from patient or surrogate, ordering and performing treatments and interventions, ordering and review of laboratory studies, ordering and review of radiographic studies, pulse oximetry, re-evaluation of patient's condition and participation in multidisciplinary rounds.  Lynnell Catalan, MD [facility] ICU Physician [facility] [place] [facility]  Pager: [phone] Mobile: [phone] After hours: [phone].   04/04/2020, 5:56 PM

## 2020-04-04 NOTE — Progress Notes (Signed)
Pt placed Prone at this time, no complications noted. Pt tolerated well.

## 2020-04-04 NOTE — Progress Notes (Signed)
Chaplain and RN at bedside for prayer with patient.

## 2020-04-05 ENCOUNTER — Inpatient Hospital Stay (HOSPITAL_COMMUNITY): Payer: 59

## 2020-04-05 DIAGNOSIS — J9601 Acute respiratory failure with hypoxia: Secondary | ICD-10-CM | POA: Diagnosis not present

## 2020-04-05 DIAGNOSIS — U071 COVID-19: Secondary | ICD-10-CM | POA: Diagnosis not present

## 2020-04-05 LAB — COMPREHENSIVE METABOLIC PANEL
ALT: 33 U/L (ref 0–44)
AST: 21 U/L (ref 15–41)
Albumin: 2.8 g/dL — ABNORMAL LOW (ref 3.5–5.0)
Alkaline Phosphatase: 58 U/L (ref 38–126)
Anion gap: 8 (ref 5–15)
BUN: 35 mg/dL — ABNORMAL HIGH (ref 6–20)
CO2: 28 mmol/L (ref 22–32)
Calcium: 8.5 mg/dL — ABNORMAL LOW (ref 8.9–10.3)
Chloride: 109 mmol/L (ref 98–111)
Creatinine, Ser: 0.65 mg/dL (ref 0.61–1.24)
GFR, Estimated: >60 mL/min (ref >60)
Glucose, Bld: 181 mg/dL — ABNORMAL HIGH (ref 70–99)
Potassium: 4.6 mmol/L (ref 3.5–5.1)
Sodium: 145 mmol/L (ref 135–145)
Total Bilirubin: 0.6 mg/dL (ref 0.3–1.2)
Total Protein: 5.2 g/dL — ABNORMAL LOW (ref 6.5–8.1)

## 2020-04-05 LAB — CBC WITH DIFFERENTIAL/PLATELET
Abs Immature Granulocytes: 0.14 10*3/uL — ABNORMAL HIGH (ref 0.00–0.07)
Basophils Absolute: 0 10*3/uL (ref 0.0–0.1)
Basophils Relative: 0 %
Eosinophils Absolute: 0 10*3/uL (ref 0.0–0.5)
Eosinophils Relative: 0 %
HCT: 39.5 % (ref 39.0–52.0)
Hemoglobin: 12.5 g/dL — ABNORMAL LOW (ref 13.0–17.0)
Immature Granulocytes: 2 %
Lymphocytes Relative: 8 %
Lymphs Abs: 0.6 10*3/uL — ABNORMAL LOW (ref 0.7–4.0)
MCH: 29.2 pg (ref 26.0–34.0)
MCHC: 31.6 g/dL (ref 30.0–36.0)
MCV: 92.3 fL (ref 80.0–100.0)
Monocytes Absolute: 0.3 10*3/uL (ref 0.1–1.0)
Monocytes Relative: 4 %
Neutro Abs: 6.4 10*3/uL (ref 1.7–7.7)
Neutrophils Relative %: 86 %
Platelets: 156 10*3/uL (ref 150–400)
RBC: 4.28 MIL/uL (ref 4.22–5.81)
RDW: 12.5 % (ref 11.5–15.5)
WBC: 7.5 10*3/uL (ref 4.0–10.5)
nRBC: 0 % (ref 0.0–0.2)

## 2020-04-05 LAB — GLUCOSE, CAPILLARY
Glucose-Capillary: 142 mg/dL — ABNORMAL HIGH (ref 70–99)
Glucose-Capillary: 164 mg/dL — ABNORMAL HIGH (ref 70–99)
Glucose-Capillary: 168 mg/dL — ABNORMAL HIGH (ref 70–99)
Glucose-Capillary: 168 mg/dL — ABNORMAL HIGH (ref 70–99)
Glucose-Capillary: 175 mg/dL — ABNORMAL HIGH (ref 70–99)
Glucose-Capillary: 186 mg/dL — ABNORMAL HIGH (ref 70–99)
Glucose-Capillary: 186 mg/dL — ABNORMAL HIGH (ref 70–99)
Glucose-Capillary: 193 mg/dL — ABNORMAL HIGH (ref 70–99)

## 2020-04-05 LAB — PHOSPHORUS: Phosphorus: 4.1 mg/dL (ref 2.5–4.6)

## 2020-04-05 LAB — MAGNESIUM: Magnesium: 2.9 mg/dL — ABNORMAL HIGH (ref 1.7–2.4)

## 2020-04-05 MED ORDER — NUTRISOURCE FIBER PO PACK
1.0000 | PACK | Freq: Two times a day (BID) | ORAL | Status: DC
  Administered 2020-04-05 – 2020-04-29 (×49): 1
  Filled 2020-04-05 (×52): qty 1

## 2020-04-05 NOTE — Progress Notes (Signed)
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  Treated with steroids and remdesivir. Received Actemra 10/3 & 10/6 .  Due to rising D-dimer lower extremity ultrasound and echo was obtained.  PCCM consulted 10/8  Past Medical History  None  Significant Hospital Events   10/2 hospital admission 10/8 subcutaneous air in neck and?  Right apical pneumothorax versus artifact 10/9 intubated due to prolonged desaturation to 70s without recovery 10/10 1 chest tube on left and 2 chest tubes placed on right due to recurrent ptx 10/11 transferred to [facility] for ECMO consideration 10/11 prone ventilation initiated Consults:    Procedures:  ETT 10/9 >> Right IJ CVL 10/9 >> Right chest tube x2 10/10>> Lt Chest tube 10/10>>  Significant Diagnostic Tests:  Chest x-ray 10/8 subcutaneous air in neck,?  Right apical pneumothorax 10/7 venous duplex negative BLE 10/3 echo nmlLVEF  Micro Data:  10/2 Bc >> ng 10/9 resp >> GPC, GNR -abundant Haemophilus influenzae  Antimicrobials:   10/9 cefepime >> 10/9 vancomycin >>  Interim history/subjective:   Transitioned back to supine from prone.  Acceptable gas exchange, kept supine.  Objective   Blood pressure 115/87, pulse 90, temperature 99.3 F (37.4 C), resp. rate (!) 35, height 5\' 11"  (1.803 m), weight 89.1 kg, SpO2 92 %.    Vent Mode: PRVC FiO2 (%):  [45 %-50 %] 45 % Set Rate:  [35 bmp] 35 bmp Vt Set:  [450 mL] 450 mL PEEP:  [14 cmH20] 14 cmH20 Plateau Pressure:  [25 cmH20-30 cmH20] 30 cmH20   Intake/Output Summary (Last 24 hours) at 04/05/2020 0942 Last  data filed at 04/05/2020 0900 Gross per 24 hour  Intake 1943.71 ml  Output 1690 ml  Net 253.71 ml   Filed Weights   04/03/20 0500 04/04/20 0500 04/05/20 0419  Weight: 89 kg 90.3 kg 89.1 kg    Examination: Gen:      No acute distress, intubated, sedated, in supine position HEENT: Sclera intact Neck:     ETT, OG tube no skin breakdown. Lungs:    Coarse crackles with lung squeaks bilaterally over back without significant for extension.  3 chest tubes in place with minimal air leak on the right side.  No air leak on the left. CV:         Heart sounds are unremarkable extremities are warm and well-perfused. Abd:   Tolerating tube feeds, u abdomen is soft.  Producing stool. Ext:    No edema; adequate peripheral perfusion Skin:      Warm and dry; no rash Neuro:  Sedated will open eyes weakly/facial grimace to painful stimulation.  No spontaneous movement.  Resolved Hospital Problem list     Assessment & Plan:   Critically ill due to ARDS due to COVID pneumonia with improvement in oxygenation with prone ventilation. Acceptable airway pressures and gas exchange -Continue current low tidal volume ventilation strategy. -Follow PF ratio to guide further proning.  Bilateral pneumothoraces, pneumomediastinum with subcutaneous emphysema -Continue continue chest tube to suction  COVID-19 pneumonia -Has completed remdesivir & received Actemra -Continue steroids  Haemophilus influenzae pneumonia -Ceftriaxone for 7 days.  AKI/hyperkalemia -No EKG changes -Repeat Lokelma  Steroid-induced  hyperglycemia -SSI  Best practice:  Diet: Tube feeds Pain/Anxiety/Delirium protocol (if indicated): fentanyl versed gtt VAP protocol (if indicated): N/A DVT prophylaxis: Lovenox GI prophylaxis: Protonix Glucose control: SSI while on steroids Mobility: Bedrest  Code Status: Full Family Communication: Wife updated in detail on 10/11. Disposition: ICU  CRITICAL CARE Performed by: Lynnell Catalan  Total critical care time: 40 minutes  Critical care time was exclusive of separately billable procedures and treating other patients.  Critical care was necessary to treat or prevent imminent or life-threatening deterioration.  Critical care was time spent personally by me on the following activities: development of treatment plan with patient and/or surrogate as well as nursing, discussions with consultants, evaluation of patient's response to treatment, examination of patient, obtaining history from patient or surrogate, ordering and performing treatments and interventions, ordering and review of laboratory studies, ordering and review of radiographic studies, pulse oximetry, re-evaluation of patient's condition and participation in multidisciplinary rounds.  Lynnell Catalan, MD [facility] ICU Physician [facility] [place] [facility]  Pager: [phone] Mobile: [phone] After hours: [phone].   04/05/2020, 9:42 AM

## 2020-04-05 NOTE — Progress Notes (Signed)
Assisted with tube exchange from 7.5 to 8.0. No noted respiratory issues at this time.

## 2020-04-05 NOTE — Progress Notes (Signed)
°   04/04/20 1815  Clinical Encounter Type  Visited With Patient;Health care provider  Visit Type Initial  Referral From Family;Nurse  Consult/Referral To Chaplain  Spiritual Encounters  Spiritual Needs Prayer  The chaplain responded to a request for prayer from the patients wife [name]). The chaplain stood at bedside with nurse (Autum) and offered a prayer to the patient. The chaplain will follow up as needed.

## 2020-04-06 ENCOUNTER — Inpatient Hospital Stay (HOSPITAL_COMMUNITY): Payer: 59

## 2020-04-06 DIAGNOSIS — J9601 Acute respiratory failure with hypoxia: Secondary | ICD-10-CM | POA: Diagnosis not present

## 2020-04-06 DIAGNOSIS — U071 COVID-19: Secondary | ICD-10-CM | POA: Diagnosis not present

## 2020-04-06 LAB — POCT I-STAT 7, (LYTES, BLD GAS, ICA,H+H)
Acid-Base Excess: 5 mmol/L — ABNORMAL HIGH (ref 0.0–2.0)
Acid-Base Excess: 7 mmol/L — ABNORMAL HIGH (ref 0.0–2.0)
Acid-Base Excess: 8 mmol/L — ABNORMAL HIGH (ref 0.0–2.0)
Acid-Base Excess: 7 mmol/L — ABNORMAL HIGH (ref 0.0–2.0)
Bicarbonate: 31.4 mmol/L — ABNORMAL HIGH (ref 20.0–28.0)
Bicarbonate: 32.9 mmol/L — ABNORMAL HIGH (ref 20.0–28.0)
Bicarbonate: 35.4 mmol/L — ABNORMAL HIGH (ref 20.0–28.0)
Bicarbonate: 33.1 mmol/L — ABNORMAL HIGH (ref 20.0–28.0)
Calcium, Ion: 1.28 mmol/L (ref 1.15–1.40)
Calcium, Ion: 1.3 mmol/L (ref 1.15–1.40)
Calcium, Ion: 1.3 mmol/L (ref 1.15–1.40)
Calcium, Ion: 1.28 mmol/L (ref 1.15–1.40)
HCT: 37 % — ABNORMAL LOW (ref 39.0–52.0)
HCT: 36 % — ABNORMAL LOW (ref 39.0–52.0)
HCT: 38 % — ABNORMAL LOW (ref 39.0–52.0)
HCT: 36 % — ABNORMAL LOW (ref 39.0–52.0)
Hemoglobin: 12.6 g/dL — ABNORMAL LOW (ref 13.0–17.0)
Hemoglobin: 12.2 g/dL — ABNORMAL LOW (ref 13.0–17.0)
Hemoglobin: 12.9 g/dL — ABNORMAL LOW (ref 13.0–17.0)
Hemoglobin: 12.2 g/dL — ABNORMAL LOW (ref 13.0–17.0)
O2 Saturation: 91 %
O2 Saturation: 90 %
O2 Saturation: 91 %
O2 Saturation: 91 %
Patient temperature: 37.3
Patient temperature: 37.4
Patient temperature: 37.8
Patient temperature: 100
Potassium: 4.5 mmol/L (ref 3.5–5.1)
Potassium: 4.4 mmol/L (ref 3.5–5.1)
Potassium: 4.8 mmol/L (ref 3.5–5.1)
Potassium: 4.7 mmol/L (ref 3.5–5.1)
Sodium: 144 mmol/L (ref 135–145)
Sodium: 144 mmol/L (ref 135–145)
Sodium: 144 mmol/L (ref 135–145)
Sodium: 144 mmol/L (ref 135–145)
TCO2: 33 mmol/L — ABNORMAL HIGH (ref 22–32)
TCO2: 35 mmol/L — ABNORMAL HIGH (ref 22–32)
TCO2: 37 mmol/L — ABNORMAL HIGH (ref 22–32)
TCO2: 35 mmol/L — ABNORMAL HIGH (ref 22–32)
pCO2 arterial: 53.8 mmHg — ABNORMAL HIGH (ref 32.0–48.0)
pCO2 arterial: 54.2 mmHg — ABNORMAL HIGH (ref 32.0–48.0)
pCO2 arterial: 66.3 mmHg (ref 32.0–48.0)
pCO2 arterial: 56 mmHg — ABNORMAL HIGH (ref 32.0–48.0)
pH, Arterial: 7.376 (ref 7.350–7.450)
pH, Arterial: 7.393 (ref 7.350–7.450)
pH, Arterial: 7.339 — ABNORMAL LOW (ref 7.350–7.450)
pH, Arterial: 7.383 (ref 7.350–7.450)
pO2, Arterial: 66 mmHg — ABNORMAL LOW (ref 83.0–108.0)
pO2, Arterial: 61 mmHg — ABNORMAL LOW (ref 83.0–108.0)
pO2, Arterial: 70 mmHg — ABNORMAL LOW (ref 83.0–108.0)
pO2, Arterial: 67 mmHg — ABNORMAL LOW (ref 83.0–108.0)

## 2020-04-06 LAB — CBC WITH DIFFERENTIAL/PLATELET
Abs Immature Granulocytes: 0.44 10*3/uL — ABNORMAL HIGH (ref 0.00–0.07)
Basophils Absolute: 0 10*3/uL (ref 0.0–0.1)
Basophils Relative: 0 %
Eosinophils Absolute: 0.1 10*3/uL (ref 0.0–0.5)
Eosinophils Relative: 1 %
HCT: 38.7 % — ABNORMAL LOW (ref 39.0–52.0)
Hemoglobin: 12.2 g/dL — ABNORMAL LOW (ref 13.0–17.0)
Immature Granulocytes: 5 %
Lymphocytes Relative: 6 %
Lymphs Abs: 0.6 10*3/uL — ABNORMAL LOW (ref 0.7–4.0)
MCH: 29.4 pg (ref 26.0–34.0)
MCHC: 31.5 g/dL (ref 30.0–36.0)
MCV: 93.3 fL (ref 80.0–100.0)
Monocytes Absolute: 0.3 10*3/uL (ref 0.1–1.0)
Monocytes Relative: 4 %
Neutro Abs: 7.5 10*3/uL (ref 1.7–7.7)
Neutrophils Relative %: 84 %
Platelets: UNDETERMINED 10*3/uL (ref 150–400)
RBC: 4.15 MIL/uL — ABNORMAL LOW (ref 4.22–5.81)
RDW: 12.6 % (ref 11.5–15.5)
WBC: 8.9 10*3/uL (ref 4.0–10.5)
nRBC: 0 % (ref 0.0–0.2)

## 2020-04-06 LAB — COMPREHENSIVE METABOLIC PANEL
ALT: 111 U/L — ABNORMAL HIGH (ref 0–44)
AST: 55 U/L — ABNORMAL HIGH (ref 15–41)
Albumin: 2.7 g/dL — ABNORMAL LOW (ref 3.5–5.0)
Alkaline Phosphatase: 66 U/L (ref 38–126)
Anion gap: 10 (ref 5–15)
BUN: 32 mg/dL — ABNORMAL HIGH (ref 6–20)
CO2: 29 mmol/L (ref 22–32)
Calcium: 8.7 mg/dL — ABNORMAL LOW (ref 8.9–10.3)
Chloride: 105 mmol/L (ref 98–111)
Creatinine, Ser: 0.57 mg/dL — ABNORMAL LOW (ref 0.61–1.24)
GFR, Estimated: >60 mL/min (ref >60)
Glucose, Bld: 161 mg/dL — ABNORMAL HIGH (ref 70–99)
Potassium: 4.6 mmol/L (ref 3.5–5.1)
Sodium: 144 mmol/L (ref 135–145)
Total Bilirubin: 0.6 mg/dL (ref 0.3–1.2)
Total Protein: 4.9 g/dL — ABNORMAL LOW (ref 6.5–8.1)

## 2020-04-06 LAB — GLUCOSE, CAPILLARY
Glucose-Capillary: 181 mg/dL — ABNORMAL HIGH (ref 70–99)
Glucose-Capillary: 151 mg/dL — ABNORMAL HIGH (ref 70–99)
Glucose-Capillary: 166 mg/dL — ABNORMAL HIGH (ref 70–99)
Glucose-Capillary: 198 mg/dL — ABNORMAL HIGH (ref 70–99)
Glucose-Capillary: 120 mg/dL — ABNORMAL HIGH (ref 70–99)
Glucose-Capillary: 169 mg/dL — ABNORMAL HIGH (ref 70–99)

## 2020-04-06 NOTE — Progress Notes (Signed)
RT note. Rt called to bed due to desat and audible air leak from cuff. Attempted many times ti fill cuff with air. Did advance the ETT from 23 to 100. No audible air leak noted at this time, XR called at this time for a chest XR. RT will continue to monitor.

## 2020-04-06 NOTE — Progress Notes (Signed)
Patient proned at this time. RN x4 RT x2. Foam pads placed on face and tube taped with cloth tape. No complications noted.

## 2020-04-06 NOTE — Progress Notes (Signed)
RT note. Turned pt. Head from Lt to Rt without any complications, able to pass cath. 

## 2020-04-06 NOTE — Progress Notes (Signed)
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  Treated with steroids and remdesivir. Received Actemra 10/3 & 10/6 .  Due to rising D-dimer lower extremity ultrasound and echo was obtained.  PCCM consulted 10/8  Past Medical History  None  Significant Hospital Events   10/2 hospital admission 10/8 subcutaneous air in neck and?  Right apical pneumothorax versus artifact 10/9 intubated due to prolonged desaturation to 70s without recovery 10/10 1 chest tube on left and 2 chest tubes placed on right due to recurrent ptx 10/11 transferred to [facility] for ECMO consideration 10/11 prone ventilation initiated Consults:    Procedures:  ETT 10/9 >> Right IJ CVL 10/9 >> Right chest tube x2 10/10>> Lt Chest tube 10/10>>  Significant Diagnostic Tests:  Chest x-ray 10/8 subcutaneous air in neck,?  Right apical pneumothorax 10/7 venous duplex negative BLE 10/3 echo nmlLVEF  Micro Data:  10/2 Bc >> ng 10/9 resp >> GPC, GNR -abundant Haemophilus influenzae  Antimicrobials:   10/9 cefepime >> 10/9 vancomycin >>  Interim history/subjective:   Remained supine overnight.  Oxygen saturation has dropped, increased FiO2, increased pressure.  Objective   Blood pressure 120/77, pulse 78, temperature 99.1 F (37.3 C), resp. rate (!) 35, height 5\' 11"  (1.803 m), weight 89.3 kg, SpO2 91 %.    Vent Mode: PRVC FiO2 (%):  [50 %-60 %] 60 % Set Rate:  [35 bmp] 35 bmp Vt Set:  [450 mL] 450 mL PEEP:  [14 cmH20] 14 cmH20 Plateau Pressure:  [26 cmH20-35 cmH20] 35 cmH20   Intake/Output Summary (Last 24 hours) at  04/06/2020 1036 Last data filed at 04/06/2020 0900 Gross per 24 hour  Intake 1254.92 ml  Output 1710 ml  Net -455.08 ml   Filed Weights   04/04/20 0500 04/05/20 0419 04/06/20 0500  Weight: 90.3 kg 89.1 kg 89.3 kg    Examination: Gen:      No acute distress, intubated, sedated, in supine position HEENT: Sclera intact Neck:     ETT, OG tube no skin breakdown. Lungs:    Coarse crackles with lung squeaks bilaterally at bases.  3 chest tubes in place with minimal air leak on the right side.  No air leak on the left. CV:        Heart sounds are unremarkable extremities are warm and well-perfused. Abd:   Tolerating tube feeds, u abdomen is soft.  Producing stool. Ext:   No edema; adequate peripheral perfusion Skin:      Warm and dry; no rash Neuro:  Sedated will open eyes weakly/facial grimace to painful stimulation.  No spontaneous movement.  Resolved Hospital Problem list   AKI/hyperkalemia  Assessment & Plan:   Critically ill due to ARDS due to COVID pneumonia with improvement in oxygenation with prone ventilation. Acceptable airway pressures and gas exchange but seem to be worsening. -Continue current low tidal volume ventilation strategy. -Follow PF ratio to guide further proning.  Suspect will have to reprone today  Bilateral pneumothoraces, pneumomediastinum with subcutaneous emphysema -Continue continue chest tube to suction  COVID-19 pneumonia -Has completed remdesivir & received Actemra -Continue steroids on tapering regimen  Haemophilus influenzae pneumonia -Ceftriaxone for 7 days.  Best practice:  Diet: Tube feeds Pain/Anxiety/Delirium protocol (if indicated): fentanyl versed gtt VAP protocol (if indicated): N/A DVT prophylaxis: Lovenox GI prophylaxis: Protonix Glucose control: SSI while on steroids with appropriate control Mobility: Bedrest  Code Status: Full Family Communication: Wife updated in detail on 10/14. Disposition: ICU  CRITICAL  CARE Performed by: Lynnell Catalan  Total critical care time: 40 minutes  Critical care time was exclusive of separately billable procedures and treating other patients.  Critical care was necessary to treat or prevent imminent or life-threatening deterioration.  Critical care was time spent personally by me on the following activities: development of treatment plan with patient and/or surrogate as well as nursing, discussions with consultants, evaluation of patient's response to treatment, examination of patient, obtaining history from patient or surrogate, ordering and performing treatments and interventions, ordering and review of laboratory studies, ordering and review of radiographic studies, pulse oximetry, re-evaluation of patient's condition and participation in multidisciplinary rounds.  Lynnell Catalan, MD [facility] ICU Physician [facility] Critical Care  Pager: [phone] Mobile: [phone] After hours: [phone].   04/06/2020, 10:36 AM

## 2020-04-06 NOTE — Progress Notes (Signed)
RT note. Pt. Head turned from RT to LT without any complications, able to pass cath. 

## 2020-04-06 NOTE — Progress Notes (Addendum)
eLink Physician-Brief Progress Note Patient Name: Dennis Howe [name]: [birth year] [name]: [id number]   Date of Service  04/06/2020  HPI/Events of Note  "ET tube cuff leak" The ET tube cuff may be at the vocal cords stenting it open and causing the leak.  eICU Interventions  ET tube advanced 3 cm with apparent resolution of the "leak". Will order stat portable CXR, his sedation was also increased. PCCM ground crew asked to see patient as well.        Thomasene Lot Kent [place] 04/06/2020, 4:56 AM

## 2020-04-07 DIAGNOSIS — U071 COVID-19: Secondary | ICD-10-CM | POA: Diagnosis not present

## 2020-04-07 DIAGNOSIS — J9601 Acute respiratory failure with hypoxia: Secondary | ICD-10-CM | POA: Diagnosis not present

## 2020-04-07 LAB — COMPREHENSIVE METABOLIC PANEL
ALT: 156 U/L — ABNORMAL HIGH (ref 0–44)
AST: 45 U/L — ABNORMAL HIGH (ref 15–41)
Albumin: 2.5 g/dL — ABNORMAL LOW (ref 3.5–5.0)
Alkaline Phosphatase: 57 U/L (ref 38–126)
Anion gap: 10 (ref 5–15)
BUN: 33 mg/dL — ABNORMAL HIGH (ref 6–20)
CO2: 29 mmol/L (ref 22–32)
Calcium: 8.4 mg/dL — ABNORMAL LOW (ref 8.9–10.3)
Chloride: 106 mmol/L (ref 98–111)
Creatinine, Ser: 0.5 mg/dL — ABNORMAL LOW (ref 0.61–1.24)
GFR, Estimated: >60 mL/min (ref >60)
Glucose, Bld: 131 mg/dL — ABNORMAL HIGH (ref 70–99)
Potassium: 4.6 mmol/L (ref 3.5–5.1)
Sodium: 145 mmol/L (ref 135–145)
Total Bilirubin: 0.5 mg/dL (ref 0.3–1.2)
Total Protein: 4.6 g/dL — ABNORMAL LOW (ref 6.5–8.1)

## 2020-04-07 LAB — CBC WITH DIFFERENTIAL/PLATELET
Abs Immature Granulocytes: 0.44 10*3/uL — ABNORMAL HIGH (ref 0.00–0.07)
Basophils Absolute: 0 10*3/uL (ref 0.0–0.1)
Basophils Relative: 0 %
Eosinophils Absolute: 0.1 10*3/uL (ref 0.0–0.5)
Eosinophils Relative: 1 %
HCT: 38.3 % — ABNORMAL LOW (ref 39.0–52.0)
Hemoglobin: 12.1 g/dL — ABNORMAL LOW (ref 13.0–17.0)
Immature Granulocytes: 4 %
Lymphocytes Relative: 7 %
Lymphs Abs: 0.7 10*3/uL (ref 0.7–4.0)
MCH: 29.7 pg (ref 26.0–34.0)
MCHC: 31.6 g/dL (ref 30.0–36.0)
MCV: 94.1 fL (ref 80.0–100.0)
Monocytes Absolute: 0.3 10*3/uL (ref 0.1–1.0)
Monocytes Relative: 2 %
Neutro Abs: 9.1 10*3/uL — ABNORMAL HIGH (ref 1.7–7.7)
Neutrophils Relative %: 86 %
Platelets: 160 10*3/uL (ref 150–400)
RBC: 4.07 MIL/uL — ABNORMAL LOW (ref 4.22–5.81)
RDW: 12.5 % (ref 11.5–15.5)
WBC: 10.7 10*3/uL — ABNORMAL HIGH (ref 4.0–10.5)
nRBC: 0 % (ref 0.0–0.2)

## 2020-04-07 LAB — POCT I-STAT 7, (LYTES, BLD GAS, ICA,H+H)
Acid-Base Excess: 7 mmol/L — ABNORMAL HIGH (ref 0.0–2.0)
Acid-Base Excess: 6 mmol/L — ABNORMAL HIGH (ref 0.0–2.0)
Bicarbonate: 33.7 mmol/L — ABNORMAL HIGH (ref 20.0–28.0)
Bicarbonate: 31.8 mmol/L — ABNORMAL HIGH (ref 20.0–28.0)
Calcium, Ion: 1.28 mmol/L (ref 1.15–1.40)
Calcium, Ion: 1.26 mmol/L (ref 1.15–1.40)
HCT: 37 % — ABNORMAL LOW (ref 39.0–52.0)
HCT: 37 % — ABNORMAL LOW (ref 39.0–52.0)
Hemoglobin: 12.6 g/dL — ABNORMAL LOW (ref 13.0–17.0)
Hemoglobin: 12.6 g/dL — ABNORMAL LOW (ref 13.0–17.0)
O2 Saturation: 92 %
O2 Saturation: 94 %
Patient temperature: 99.5
Patient temperature: 37.4
Potassium: 4.7 mmol/L (ref 3.5–5.1)
Potassium: 4.3 mmol/L (ref 3.5–5.1)
Sodium: 143 mmol/L (ref 135–145)
Sodium: 142 mmol/L (ref 135–145)
TCO2: 35 mmol/L — ABNORMAL HIGH (ref 22–32)
TCO2: 33 mmol/L — ABNORMAL HIGH (ref 22–32)
pCO2 arterial: 57 mmHg — ABNORMAL HIGH (ref 32.0–48.0)
pCO2 arterial: 49.1 mmHg — ABNORMAL HIGH (ref 32.0–48.0)
pH, Arterial: 7.382 (ref 7.350–7.450)
pH, Arterial: 7.421 (ref 7.350–7.450)
pO2, Arterial: 68 mmHg — ABNORMAL LOW (ref 83.0–108.0)
pO2, Arterial: 73 mmHg — ABNORMAL LOW (ref 83.0–108.0)

## 2020-04-07 LAB — GLUCOSE, CAPILLARY
Glucose-Capillary: 125 mg/dL — ABNORMAL HIGH (ref 70–99)
Glucose-Capillary: 139 mg/dL — ABNORMAL HIGH (ref 70–99)
Glucose-Capillary: 153 mg/dL — ABNORMAL HIGH (ref 70–99)
Glucose-Capillary: 172 mg/dL — ABNORMAL HIGH (ref 70–99)
Glucose-Capillary: 173 mg/dL — ABNORMAL HIGH (ref 70–99)
Glucose-Capillary: 178 mg/dL — ABNORMAL HIGH (ref 70–99)

## 2020-04-07 MED ORDER — VITAL 1.5 CAL PO LIQD
1000.0000 mL | ORAL | Status: DC
  Administered 2020-04-07 – 2020-04-12 (×11): 1000 mL
  Filled 2020-04-07 (×7): qty 1000

## 2020-04-07 NOTE — Procedures (Signed)
Cortrak  Person Inserting Tube:  Renie Ora, RD Tube Type:  Cortrak - 43 inches Tube Location:  Right nare Initial Placement:  Stomach Secured by: Bridle Technique Used to Measure Tube Placement:  Documented cm marking at nare/ corner of mouth Cortrak Secured At:  74 cm Procedure Comments:  Cortrak Tube Team Note:  Consult received to place a Cortrak feeding tube.   No x-ray is required. RN may begin using tube. Bridle secured lower down on tube than usual for when patient is proning so tube can more easily be pulled to the side.   If the tube becomes dislodged please keep the tube and contact the Cortrak team at [email or web address] (password [email or web address]) for replacement.  If after hours and replacement cannot be delayed, place a NG tube and confirm placement with an abdominal x-ray.       Trenton Gammon, MS, RD, LDN, CNSC Inpatient Clinical Dietitian RD pager # available in AMION  After hours/weekend pager # available in [facility]

## 2020-04-07 NOTE — Progress Notes (Signed)
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  Treated with steroids and remdesivir. Received Actemra 10/3 & 10/6 .  Due to rising D-dimer lower extremity ultrasound and echo was obtained.  PCCM consulted 10/8  Past Medical History  None  Significant Hospital Events   10/2 hospital admission 10/8 subcutaneous air in neck and?  Right apical pneumothorax versus artifact 10/9 intubated due to prolonged desaturation to 70s without recovery 10/10 1 chest tube on left and 2 chest tubes placed on right due to recurrent ptx 10/11 transferred to [facility] for ECMO consideration 10/11 prone ventilation initiated Consults:    Procedures:  ETT 10/9 >> Right IJ CVL 10/9 >> Right chest tube x2 10/10>> Lt Chest tube 10/10>>  Significant Diagnostic Tests:  Chest x-ray 10/8 subcutaneous air in neck,?  Right apical pneumothorax 10/7 venous duplex negative BLE 10/3 echo nmlLVEF  Micro Data:  10/2 Bc >> ng 10/9 resp >> GPC, GNR -abundant Haemophilus influenzae  Antimicrobials:   10/9 cefepime >> 10/9 vancomycin >>  Interim history/subjective:   Switched to APRV yesterday in response to increasing airway pressures.  Placed prone overnight.  Objective   Blood pressure 108/68, pulse 83, temperature 99 F (37.2 C), temperature source Bladder, resp. rate 11, height 5\' 11"  (1.803 m), weight 87.1 kg, SpO2 90 %.    Vent Mode: Bi-Vent FiO2 (%):  [40 %-50 %] 40 % Set Rate:  [11 bmp-35 bmp] 11 bmp Vt Set:  [450 mL] 450 mL PEEP:  [0 cmH20-14 cmH20] 0 cmH20 Pressure Support:  [10 cmH20] 10 cmH20 Plateau Pressure:   [30 cmH20] 30 cmH20   Intake/Output Summary (Last 24 hours) at 04/07/2020 1035 Last data filed at 04/07/2020 0800 Gross per 24 hour  Intake 1548.64 ml  Output 672 ml  Net 876.64 ml   Filed Weights   04/05/20 0419 04/06/20 0500 04/07/20 0500  Weight: 89.1 kg 89.3 kg 87.1 kg    Examination: Gen:      No acute distress, intubated, sedated, in supine position HEENT: Sclera intact Neck:     ETT, OG tube no skin breakdown.  No air leak Lungs:    Coarse crackles with lung squeaks bilaterally at bases-overall improvement in breath sounds..  3 chest tubes in place with minimal air leak on the right side.  No air leak on the left. CV:        Heart sounds are unremarkable extremities are warm and well-perfused. Abd:   Tolerating tube feeds, abdomen is soft.  Producing stool. Ext:   No edema; adequate peripheral perfusion Skin:      Warm and dry; no rash Neuro:  Sedated will open eyes weakly to command.  No spontaneous movement.  Resolved Hospital Problem list   AKI/hyperkalemia  Assessment & Plan:   Critically ill due to ARDS due to COVID pneumonia with improvement in oxygenation with prone ventilation and switch to APRV. -Continue current APRV ventilation strategy. -Wean sedation and allow spontaneous breathing on APRV as this will enhance gas exchange. -Follow PF ratio to guide further proning.  May not need to reprone today as PF ratio 162 and PCO2 improving.  Bilateral pneumothoraces, pneumomediastinum with subcutaneous  emphysema -Continue continue chest tube to suction  COVID-19 pneumonia -Has completed remdesivir & received Actemra -Continue steroids on tapering regimen  Haemophilus influenzae pneumonia -Ceftriaxone for 7 days.   Best practice:  Diet: Tube feeds at goal, will start fiber supplementation Pain/Anxiety/Delirium protocol (if indicated): fentanyl and versed gtt, titrate to RASS of -2 to -3 VAP protocol (if indicated): N/A DVT prophylaxis: Lovenox GI  prophylaxis: Protonix Glucose control: SSI while on steroids with appropriate control Mobility: Bedrest  Code Status: Full Family Communication: Wife updated in detail on 10/14. Disposition: ICU  CRITICAL CARE Performed by: Lynnell Catalan  Total critical care time: 40 minutes  Critical care time was exclusive of separately billable procedures and treating other patients.  Critical care was necessary to treat or prevent imminent or life-threatening deterioration.  Critical care was time spent personally by me on the following activities: development of treatment plan with patient and/or surrogate as well as nursing, discussions with consultants, evaluation of patient's response to treatment, examination of patient, obtaining history from patient or surrogate, ordering and performing treatments and interventions, ordering and review of laboratory studies, ordering and review of radiographic studies, pulse oximetry, re-evaluation of patient's condition and participation in multidisciplinary rounds.  Lynnell Catalan, MD [facility] ICU Physician [facility] Critical [facility]  Pager: [phone] Mobile: [phone] After hours: [phone].   04/07/2020, 10:35 AM

## 2020-04-07 NOTE — Progress Notes (Signed)
Patient placed back in supine position. RT and RNx4 assist. No skin breakdown noted. No complications at this time.  

## 2020-04-07 NOTE — Progress Notes (Signed)
Nutrition Follow-up  DOCUMENTATION CODES:   Not applicable  INTERVENTION:   Transition TF via OG tube  Vital 1.5 @ 60 ml/hr (1440 ml/day) 90 ml ProSource TF QID  Provides: 2480 kcal, 185 grams protein, and 1097 ml free water.    NUTRITION DIAGNOSIS:   Increased nutrient needs related to acute illness, catabolic illness (GLOVF-64 infection) as evidenced by estimated needs.  Ongoing.   GOAL:   Patient will meet greater than or equal to 90% of their needs  Met with TF.   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 He has received remdesivir course and received actemra on 10/3 and 10/6. He has no known medical history.  10/2 admitted to [facility] 10/9 intubated due to worsening hypoxia  10/10 3 chest tubes placed du to recurrent PTX 10/11 tx to 36M for [place] consideration, pt is a candidate but deferred for now; pt started proning   Patient is currently intubated on ventilator support MV: 7.7 L/min Temp (24hrs), Avg:99.6 F (37.6 C), Min:99 F (37.2 C), Max:100 F (37.8 C)  Medications reviewed and include: vitamin C, colace, nutrisource fiber BID, SSI, solumedrol, zinc  Fentanyl Versed  Labs reviewed:  CBG: 125   I&O: - 5.8 L since admission Chest tubes: 1: 24 ml 2: 35 ml 3: 13 ml  OG tube with tip in the stomach Current TF:  Nepro @ 45 ml/hr with 90 ml ProSource TF QID Provides: 2264 kcal and 175 grams protein  NUTRITION - FOCUSED PHYSICAL EXAM:    Most Recent Value  Orbital Region No depletion  Upper Arm Region No depletion  Thoracic and Lumbar Region No depletion  Buccal Region No depletion  Temple Region No depletion  Clavicle Bone Region No depletion  Clavicle and Acromion Bone Region No depletion   Scapular Bone Region Unable to assess  Dorsal Hand No depletion  Patellar Region No depletion  Anterior Thigh Region No depletion  Posterior Calf Region No depletion  Edema (RD Assessment) Mild  Hair Reviewed  Eyes Unable to assess  Mouth Unable to assess  Skin Reviewed  Nails Reviewed       Diet Order:   Diet Order            Diet NPO time specified  Diet effective now                 EDUCATION NEEDS:   No education needs have been identified at this time  Skin:  Skin Assessment: Reviewed RN Assessment  Last BM:  rectal tube  Height:   Ht Readings from Last 1 Encounters:  04/03/20 $RemoveB'5\' 11"'pwBnCwSS$  (1.803 m)    Weight:   Wt Readings from Last 1 Encounters:  04/07/20 87.1 kg    Ideal Body Weight:     BMI:  Body mass index is 26.78 kg/m.  Estimated Nutritional Needs:   Kcal:  2300-2500  Protein:  160-178 grams (1.8-2 grams/kg)  Fluid:  >/= 2.8 L/day  Lockie Pares., RD, LDN, CNSC See AMiON for contact information

## 2020-04-07 NOTE — Progress Notes (Signed)
RT note. Pt. Head turned LT to RT without any complications, able to pass cath

## 2020-04-08 ENCOUNTER — Inpatient Hospital Stay (HOSPITAL_COMMUNITY): Payer: 59

## 2020-04-08 DIAGNOSIS — J9601 Acute respiratory failure with hypoxia: Secondary | ICD-10-CM | POA: Diagnosis not present

## 2020-04-08 DIAGNOSIS — U071 COVID-19: Secondary | ICD-10-CM | POA: Diagnosis not present

## 2020-04-08 LAB — GLUCOSE, CAPILLARY
Glucose-Capillary: 192 mg/dL — ABNORMAL HIGH (ref 70–99)
Glucose-Capillary: 156 mg/dL — ABNORMAL HIGH (ref 70–99)
Glucose-Capillary: 146 mg/dL — ABNORMAL HIGH (ref 70–99)
Glucose-Capillary: 186 mg/dL — ABNORMAL HIGH (ref 70–99)
Glucose-Capillary: 167 mg/dL — ABNORMAL HIGH (ref 70–99)

## 2020-04-08 LAB — POCT I-STAT 7, (LYTES, BLD GAS, ICA,H+H)
Acid-Base Excess: 7 mmol/L — ABNORMAL HIGH (ref 0.0–2.0)
Bicarbonate: 32.6 mmol/L — ABNORMAL HIGH (ref 20.0–28.0)
Calcium, Ion: 1.21 mmol/L (ref 1.15–1.40)
HCT: 35 % — ABNORMAL LOW (ref 39.0–52.0)
Hemoglobin: 11.9 g/dL — ABNORMAL LOW (ref 13.0–17.0)
O2 Saturation: 96 %
Potassium: 4.1 mmol/L (ref 3.5–5.1)
Sodium: 142 mmol/L (ref 135–145)
TCO2: 34 mmol/L — ABNORMAL HIGH (ref 22–32)
pCO2 arterial: 48.6 mmHg — ABNORMAL HIGH (ref 32.0–48.0)
pH, Arterial: 7.435 (ref 7.350–7.450)
pO2, Arterial: 84 mmHg (ref 83.0–108.0)

## 2020-04-08 LAB — CBC WITH DIFFERENTIAL/PLATELET
Abs Immature Granulocytes: 0.87 10*3/uL — ABNORMAL HIGH (ref 0.00–0.07)
Basophils Absolute: 0.1 10*3/uL (ref 0.0–0.1)
Basophils Relative: 1 %
Eosinophils Absolute: 0.1 10*3/uL (ref 0.0–0.5)
Eosinophils Relative: 1 %
HCT: 38.4 % — ABNORMAL LOW (ref 39.0–52.0)
Hemoglobin: 12.5 g/dL — ABNORMAL LOW (ref 13.0–17.0)
Immature Granulocytes: 7 %
Lymphocytes Relative: 7 %
Lymphs Abs: 0.9 10*3/uL (ref 0.7–4.0)
MCH: 30.5 pg (ref 26.0–34.0)
MCHC: 32.6 g/dL (ref 30.0–36.0)
MCV: 93.7 fL (ref 80.0–100.0)
Monocytes Absolute: 0.3 10*3/uL (ref 0.1–1.0)
Monocytes Relative: 3 %
Neutro Abs: 9.6 10*3/uL — ABNORMAL HIGH (ref 1.7–7.7)
Neutrophils Relative %: 81 %
Platelets: 162 10*3/uL (ref 150–400)
RBC: 4.1 MIL/uL — ABNORMAL LOW (ref 4.22–5.81)
RDW: 12.7 % (ref 11.5–15.5)
WBC: 11.7 10*3/uL — ABNORMAL HIGH (ref 4.0–10.5)
nRBC: 0.3 % — ABNORMAL HIGH (ref 0.0–0.2)

## 2020-04-08 LAB — COMPREHENSIVE METABOLIC PANEL
ALT: 178 U/L — ABNORMAL HIGH (ref 0–44)
AST: 40 U/L (ref 15–41)
Albumin: 2.6 g/dL — ABNORMAL LOW (ref 3.5–5.0)
Alkaline Phosphatase: 68 U/L (ref 38–126)
Anion gap: 11 (ref 5–15)
BUN: 33 mg/dL — ABNORMAL HIGH (ref 6–20)
CO2: 27 mmol/L (ref 22–32)
Calcium: 8.6 mg/dL — ABNORMAL LOW (ref 8.9–10.3)
Chloride: 105 mmol/L (ref 98–111)
Creatinine, Ser: 0.6 mg/dL — ABNORMAL LOW (ref 0.61–1.24)
GFR, Estimated: >60 mL/min (ref >60)
Glucose, Bld: 196 mg/dL — ABNORMAL HIGH (ref 70–99)
Potassium: 4.4 mmol/L (ref 3.5–5.1)
Sodium: 143 mmol/L (ref 135–145)
Total Bilirubin: 0.7 mg/dL (ref 0.3–1.2)
Total Protein: 4.9 g/dL — ABNORMAL LOW (ref 6.5–8.1)

## 2020-04-08 LAB — POCT ACTIVATED CLOTTING TIME: Activated Clotting Time: 263 seconds

## 2020-04-08 NOTE — Progress Notes (Addendum)
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  Treated with steroids and remdesivir. Received Actemra 10/3 & 10/6 .  Due to rising D-dimer lower extremity ultrasound and echo was obtained.  PCCM consulted 10/8  Past Medical History  None  Significant Hospital Events   10/2 hospital admission 10/8 subcutaneous air in neck and?  Right apical pneumothorax versus artifact 10/9 intubated due to prolonged desaturation to 70s without recovery 10/10 1 chest tube on left and 2 chest tubes placed on right due to recurrent ptx 10/11 transferred to [facility] for ECMO consideration 10/11 prone ventilation initiated 10/14 started on APRV 10/15 prone ventilation discontinued Consults:    Procedures:  ETT 10/9 >> Right IJ CVL 10/9 >> Right chest tube x2 10/10>> Lt Chest tube 10/10>>  Significant Diagnostic Tests:  Chest x-ray 10/8 subcutaneous air in neck,?  Right apical pneumothorax 10/7 venous duplex negative BLE 10/3 echo nmlLVEF  Micro Data:  10/2 Bc >> ng 10/9 resp >> GPC, GNR -abundant Haemophilus influenzae  Antimicrobials:   10/9 cefepime >> 10/9 vancomycin >>  Interim history/subjective:   Has remained supine overnight.  Continues to have improving oxygenation.  Objective   Blood pressure (!) 146/108, pulse 86, temperature 98.8 F (37.1 C), temperature source Core, resp. rate 18, height 5\' 11"  (1.803 m), weight 89.9 kg, SpO2 94 %.    Vent Mode: Bi-Vent FiO2 (%):  [50 %] 50 % PEEP:  [0 cmH20] 0 cmH20 Plateau Pressure:  [29 cmH20] 29 cmH20   Intake/Output Summary (Last 24 hours) at  04/08/2020 1342 Last data filed at 04/08/2020 1230 Gross per 24 hour  Intake 2113.78 ml  Output 1912 ml  Net 201.78 ml   Filed Weights   04/06/20 0500 04/07/20 0500 04/08/20 0500  Weight: 89.3 kg 87.1 kg 89.9 kg    Examination: Gen:      No acute distress, intubated, sedated, in supine position HEENT: Sclera intact Neck:     ETT, OG tube no skin breakdown.  No air leak Lungs:    Coarse crackles with lung squeaks bilaterally at bases-overall improvement in breath sounds..  3 chest tubes in place with minimal air leak on the right side.  No air leak on the left. CV:        Heart sounds are unremarkable extremities are warm and well-perfused. Abd:   Tolerating tube feeds, abdomen is soft.  Producing stool. Ext:   No edema; adequate peripheral perfusion Skin:      Warm and dry; no rash Neuro:  Sedated will open eyes weakly to command.  Moves feet to command  Resolved Hospital Problem list   AKI/hyperkalemia  Assessment & Plan:   Critically ill due to ARDS due to COVID pneumonia with improvement in oxygenation with prone ventilation and switch to APRV. -Continue current APRV ventilation strategy.  Drop P high today to 27. Continue to drop P high by 2 every 6h, maintaining saturation >90%. Transition to [facility] once P high 20.  -Continue to wean sedation and allow spontaneous breathing on APRV as this will enhance gas exchange.  Bilateral pneumothoraces, pneumomediastinum with subcutaneous emphysema -Continue continue chest tube to suction  COVID-19 pneumonia -  Has completed remdesivir & received Actemra -Continue steroids on tapering regimen  Haemophilus influenzae pneumonia -Ceftriaxone for 7 days.   Best practice:  Diet: Tube feeds at goal, will start fiber supplementation Pain/Anxiety/Delirium protocol (if indicated): fentanyl and versed gtt, titrate to RASS of -1 to -2 VAP protocol (if indicated): N/A DVT prophylaxis: Lovenox GI prophylaxis: Protonix Glucose control: SSI  while on steroids with appropriate control Mobility: Bedrest  Code Status: Full Family Communication: Wife updated in detail on 10/16. Disposition: ICU  CRITICAL CARE Performed by: Lynnell Catalan  Total critical care time: 40 minutes  Critical care time was exclusive of separately billable procedures and treating other patients.  Critical care was necessary to treat or prevent imminent or life-threatening deterioration.  Critical care was time spent personally by me on the following activities: development of treatment plan with patient and/or surrogate as well as nursing, discussions with consultants, evaluation of patient's response to treatment, examination of patient, obtaining history from patient or surrogate, ordering and performing treatments and interventions, ordering and review of laboratory studies, ordering and review of radiographic studies, pulse oximetry, re-evaluation of patient's condition and participation in multidisciplinary rounds.  Lynnell Catalan, MD [facility] ICU Physician [facility] Critical Care  Pager: [phone] Mobile: [phone] After hours: [phone].   04/08/2020, 1:42 PM

## 2020-04-08 NOTE — Progress Notes (Signed)
eLink Physician-Brief Progress Note Patient Name: Dennis Howe [name]: 1975/11/28 [name]: [id number]   Date of Service  04/08/2020  HPI/Events of Note  Ventilator asynchrony - Looks well sedated on Fentanyl and Versed IV infusions. Occasional ventilator asynchrony.   eICU Interventions  Plan: 1. Titrate sedation with Fentanyl and Versed IV infusions. 2. If remains in ventilator asynchrony, will consider intermittent NMB.     Intervention Category Major Interventions: Respiratory failure - evaluation and management  Nivedita Mirabella Eugene 04/08/2020, 7:50 PM

## 2020-04-08 NOTE — Therapy (Signed)
5am ABG results on current vent setting  pH 7.43 PCO2 48.6 PO2 84 BE 7 HCO3 32.6 TCO2 34 SO2 96%

## 2020-04-08 NOTE — Progress Notes (Signed)
Elink notified of patient continued desaturations, labored breathing and tachypnea.  Boluses of Fentanyl and Versed given, max dose of Fentanyl achieved. Versed infusion increased. Remains uncomfortable with obvious use of accessory muscles and coughing.  RT at bedside. Patient placed in PRVC mode by RT due to continued desaturations.  Awaiting further orders.

## 2020-04-09 ENCOUNTER — Inpatient Hospital Stay (HOSPITAL_COMMUNITY): Payer: 59

## 2020-04-09 DIAGNOSIS — J9601 Acute respiratory failure with hypoxia: Secondary | ICD-10-CM | POA: Diagnosis not present

## 2020-04-09 DIAGNOSIS — U071 COVID-19: Secondary | ICD-10-CM | POA: Diagnosis not present

## 2020-04-09 LAB — CBC WITH DIFFERENTIAL/PLATELET
Abs Immature Granulocytes: 0.56 10*3/uL — ABNORMAL HIGH (ref 0.00–0.07)
Basophils Absolute: 0.1 10*3/uL (ref 0.0–0.1)
Basophils Relative: 1 %
Eosinophils Absolute: 0.1 10*3/uL (ref 0.0–0.5)
Eosinophils Relative: 1 %
HCT: 38.7 % — ABNORMAL LOW (ref 39.0–52.0)
Hemoglobin: 12.3 g/dL — ABNORMAL LOW (ref 13.0–17.0)
Immature Granulocytes: 5 %
Lymphocytes Relative: 5 %
Lymphs Abs: 0.5 10*3/uL — ABNORMAL LOW (ref 0.7–4.0)
MCH: 30.2 pg (ref 26.0–34.0)
MCHC: 31.8 g/dL (ref 30.0–36.0)
MCV: 95.1 fL (ref 80.0–100.0)
Monocytes Absolute: 0.3 10*3/uL (ref 0.1–1.0)
Monocytes Relative: 3 %
Neutro Abs: 9.5 10*3/uL — ABNORMAL HIGH (ref 1.7–7.7)
Neutrophils Relative %: 85 %
Platelets: 136 10*3/uL — ABNORMAL LOW (ref 150–400)
RBC: 4.07 MIL/uL — ABNORMAL LOW (ref 4.22–5.81)
RDW: 12.5 % (ref 11.5–15.5)
WBC: 11 10*3/uL — ABNORMAL HIGH (ref 4.0–10.5)
nRBC: 0.2 % (ref 0.0–0.2)

## 2020-04-09 LAB — POCT I-STAT 7, (LYTES, BLD GAS, ICA,H+H)
Acid-Base Excess: 6 mmol/L — ABNORMAL HIGH (ref 0.0–2.0)
Acid-Base Excess: 4 mmol/L — ABNORMAL HIGH (ref 0.0–2.0)
Bicarbonate: 33.7 mmol/L — ABNORMAL HIGH (ref 20.0–28.0)
Bicarbonate: 35.1 mmol/L — ABNORMAL HIGH (ref 20.0–28.0)
Calcium, Ion: 1.22 mmol/L (ref 1.15–1.40)
Calcium, Ion: 1.21 mmol/L (ref 1.15–1.40)
HCT: 35 % — ABNORMAL LOW (ref 39.0–52.0)
HCT: 42 % (ref 39.0–52.0)
Hemoglobin: 11.9 g/dL — ABNORMAL LOW (ref 13.0–17.0)
Hemoglobin: 14.3 g/dL (ref 13.0–17.0)
O2 Saturation: 88 %
O2 Saturation: 81 %
Patient temperature: 36.7
Potassium: 4.6 mmol/L (ref 3.5–5.1)
Potassium: 5.3 mmol/L — ABNORMAL HIGH (ref 3.5–5.1)
Sodium: 140 mmol/L (ref 135–145)
Sodium: 141 mmol/L (ref 135–145)
TCO2: 35 mmol/L — ABNORMAL HIGH (ref 22–32)
TCO2: 38 mmol/L — ABNORMAL HIGH (ref 22–32)
pCO2 arterial: 60.1 mmHg — ABNORMAL HIGH (ref 32.0–48.0)
pCO2 arterial: 89.3 mmHg (ref 32.0–48.0)
pH, Arterial: 7.356 (ref 7.350–7.450)
pH, Arterial: 7.201 — ABNORMAL LOW (ref 7.350–7.450)
pO2, Arterial: 60 mmHg — ABNORMAL LOW (ref 83.0–108.0)
pO2, Arterial: 57 mmHg — ABNORMAL LOW (ref 83.0–108.0)

## 2020-04-09 LAB — GLUCOSE, CAPILLARY
Glucose-Capillary: 129 mg/dL — ABNORMAL HIGH (ref 70–99)
Glucose-Capillary: 144 mg/dL — ABNORMAL HIGH (ref 70–99)
Glucose-Capillary: 162 mg/dL — ABNORMAL HIGH (ref 70–99)
Glucose-Capillary: 170 mg/dL — ABNORMAL HIGH (ref 70–99)
Glucose-Capillary: 185 mg/dL — ABNORMAL HIGH (ref 70–99)
Glucose-Capillary: 194 mg/dL — ABNORMAL HIGH (ref 70–99)
Glucose-Capillary: 237 mg/dL — ABNORMAL HIGH (ref 70–99)

## 2020-04-09 MED ORDER — SODIUM CHLORIDE 0.9 % IV BOLUS
1000.0000 mL | Freq: Once | INTRAVENOUS | Status: AC
  Administered 2020-04-09: 1000 mL via INTRAVENOUS

## 2020-04-09 MED ORDER — LABETALOL HCL 5 MG/ML IV SOLN
10.0000 mg | Freq: Once | INTRAVENOUS | Status: AC
  Administered 2020-04-09: 10 mg via INTRAVENOUS
  Filled 2020-04-09: qty 4

## 2020-04-09 NOTE — Progress Notes (Signed)
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  Treated with steroids and remdesivir. Received Actemra 10/3 & 10/6 .  Due to rising D-dimer lower extremity ultrasound and echo was obtained.  PCCM consulted 10/8  Past Medical History  None  Significant Hospital Events   10/2 hospital admission 10/8 subcutaneous air in neck and?  Right apical pneumothorax versus artifact 10/9 intubated due to prolonged desaturation to 70s without recovery 10/10 1 chest tube on left and 2 chest tubes placed on right due to recurrent ptx 10/11 transferred to [facility] for ECMO consideration 10/11 prone ventilation initiated 10/14 started on APRV 10/15 prone ventilation discontinued Consults:    Procedures:  ETT 10/9 >> Right IJ CVL 10/9 >> Right chest tube x2 10/10>> Lt Chest tube 10/10>>  Significant Diagnostic Tests:  Chest x-ray 10/8 subcutaneous air in neck,?  Right apical pneumothorax 10/7 venous duplex negative BLE 10/3 echo nmlLVEF  Micro Data:  10/2 Bc >> ng 10/9 resp >> GPC, GNR -abundant Haemophilus influenzae  Antimicrobials:   10/9 cefepime >> 10/9 vancomycin >>  Interim history/subjective:   Begin weaning APRV yesterday. Desaturation overnight  Objective   Blood pressure 125/71, pulse 78, temperature 99 F (37.2 C), resp. rate 17, height 5\' 11"  (1.803 m), weight 91.6 kg, SpO2 (!) 87 %.    Vent Mode: PRVC FiO2 (%):  [40 %-100 %] 100 % Set Rate:  [30 bmp] 30 bmp Vt Set:  [500 mL] 500 mL PEEP:  [0 cmH20-11 cmH20] 11 cmH20 Pressure Support:  [10 cmH20] 10 cmH20 Plateau Pressure:  [32 cmH20]  32 cmH20   Intake/Output Summary (Last 24 hours) at 04/09/2020 1025 Last data filed at 04/09/2020 0933 Gross per 24 hour  Intake 2405.25 ml  Output 2346 ml  Net 59.25 ml   Filed Weights   04/07/20 0500 04/08/20 0500 04/09/20 0356  Weight: 87.1 kg 89.9 kg 91.6 kg    Examination: Gen:      No acute distress, intubated, sedated, in supine position HEENT: Sclera intact Neck:     ETT, OG tube no skin breakdown.  No air leak Lungs:    Coarse crackles with lung squeaks bilaterally at bases-overall improvement in breath sounds..  3 chest tubes in place with minimal air leak on the right side.  No air leak on the left. CV:        Heart sounds are unremarkable extremities are warm and well-perfused. Abd:   Tolerating tube feeds, abdomen is soft.  Producing stool. Ext:   No edema; adequate peripheral perfusion Skin:      Warm and dry; no rash Neuro:  Sedated will open eyes weakly to command.  Moves feet to command  Resolved Hospital Problem list   AKI/hyperkalemia  Assessment & Plan:   Critically ill due to ARDS due to COVID pneumonia with improvement in oxygenation with prone ventilation and switch to APRV. Desaturation today due to derecruitment from switch to [facility] -Resume APRV and wean more slowly.  Bilateral pneumothoraces, pneumomediastinum with subcutaneous emphysema recurrent right-sided pneumothorax today -Increase right-sided chest tube suction -Repeat chest x-ray tomorrow  COVID-19 pneumonia -Has completed remdesivir & received Actemra -Continue steroids on tapering  regimen  Haemophilus influenzae pneumonia -Ceftriaxone for 7 days.  Best practice:  Diet: Tube feeds at goal, will start fiber supplementation Pain/Anxiety/Delirium protocol (if indicated): fentanyl and versed gtt, titrate to RASS of -1 to -2 VAP protocol (if indicated): N/A DVT prophylaxis: Lovenox GI prophylaxis: Protonix Glucose control: SSI while on steroids with appropriate control Mobility: Bedrest   Code Status: Full Family Communication: Wife updated in detail on 10/16. Disposition: ICU  CRITICAL CARE Performed by: Lynnell Catalan  Total critical care time: 40 minutes  Critical care time was exclusive of separately billable procedures and treating other patients.  Critical care was necessary to treat or prevent imminent or life-threatening deterioration.  Critical care was time spent personally by me on the following activities: development of treatment plan with patient and/or surrogate as well as nursing, discussions with consultants, evaluation of patient's response to treatment, examination of patient, obtaining history from patient or surrogate, ordering and performing treatments and interventions, ordering and review of laboratory studies, ordering and review of radiographic studies, pulse oximetry, re-evaluation of patient's condition and participation in multidisciplinary rounds.  Lynnell Catalan, MD [facility] ICU Physician [facility] [place] Critical Care  Pager: [phone] Mobile: [phone] After hours: [phone].   04/09/2020, 10:25 AM

## 2020-04-09 NOTE — Therapy (Signed)
5am ABG results:  PH 7.35 PCO2 60.1 PO2 60 BE 6 HCO3 33.7 TCO2 35 sO2 88%

## 2020-04-09 NOTE — Progress Notes (Signed)
CRITICAL VALUE ALERT  Critical Value:  Critical chest xray report  Date & Time Notied:  04/09/2020 @ 1040  Provider Notified: Dr Denese Killings  Orders Received/Actions taken: MD and bedside RN aware

## 2020-04-09 NOTE — Therapy (Signed)
Pt desaturated into the 70's while in Bivent even after being increased to 100% from 70%. Elink called an pt switch back to [facility] per Dr.Stretch  will continue to monitor.

## 2020-04-09 NOTE — Progress Notes (Signed)
eLink Physician-Brief Progress Note Patient Name: Xzander Gilham [name]: [birth year] [name]: [id number]   Date of Service  04/09/2020  HPI/Events of Note  RN calls with concern for worsening pneumothorax. Sats dropping (now in 70s. Asymmetry to chest movement. Already has 2 chest tubes on R side and 1 on left for known PTX.   eICU Interventions  Bedside team (Dr. Cyndie Chime) notified immediately. Suspect interval increase in R PTX. Switch from BiLevel to [facility] mode.     Intervention Category Major Interventions: Respiratory failure - evaluation and management  Marveen Reeks Breeze Berringer 04/09/2020, 10:08 PM

## 2020-04-10 ENCOUNTER — Inpatient Hospital Stay (HOSPITAL_COMMUNITY): Payer: 59

## 2020-04-10 DIAGNOSIS — U071 COVID-19: Secondary | ICD-10-CM | POA: Diagnosis not present

## 2020-04-10 DIAGNOSIS — J9601 Acute respiratory failure with hypoxia: Secondary | ICD-10-CM | POA: Diagnosis not present

## 2020-04-10 LAB — CBC WITH DIFFERENTIAL/PLATELET
Abs Immature Granulocytes: 0.24 10*3/uL — ABNORMAL HIGH (ref 0.00–0.07)
Basophils Absolute: 0.1 10*3/uL (ref 0.0–0.1)
Basophils Relative: 1 %
Eosinophils Absolute: 0.4 10*3/uL (ref 0.0–0.5)
Eosinophils Relative: 4 %
HCT: 37.1 % — ABNORMAL LOW (ref 39.0–52.0)
Hemoglobin: 11.5 g/dL — ABNORMAL LOW (ref 13.0–17.0)
Immature Granulocytes: 3 %
Lymphocytes Relative: 9 %
Lymphs Abs: 0.8 10*3/uL (ref 0.7–4.0)
MCH: 29.9 pg (ref 26.0–34.0)
MCHC: 31 g/dL (ref 30.0–36.0)
MCV: 96.6 fL (ref 80.0–100.0)
Monocytes Absolute: 0.2 10*3/uL (ref 0.1–1.0)
Monocytes Relative: 2 %
Neutro Abs: 6.7 10*3/uL (ref 1.7–7.7)
Neutrophils Relative %: 81 %
Platelets: 118 10*3/uL — ABNORMAL LOW (ref 150–400)
RBC: 3.84 MIL/uL — ABNORMAL LOW (ref 4.22–5.81)
RDW: 12.7 % (ref 11.5–15.5)
WBC: 8.4 10*3/uL (ref 4.0–10.5)
nRBC: 0.6 % — ABNORMAL HIGH (ref 0.0–0.2)

## 2020-04-10 LAB — POCT I-STAT 7, (LYTES, BLD GAS, ICA,H+H)
Acid-Base Excess: 10 mmol/L — ABNORMAL HIGH (ref 0.0–2.0)
Bicarbonate: 37.5 mmol/L — ABNORMAL HIGH (ref 20.0–28.0)
Calcium, Ion: 1.21 mmol/L (ref 1.15–1.40)
HCT: 33 % — ABNORMAL LOW (ref 39.0–52.0)
Hemoglobin: 11.2 g/dL — ABNORMAL LOW (ref 13.0–17.0)
O2 Saturation: 99 %
Potassium: 4.2 mmol/L (ref 3.5–5.1)
Sodium: 139 mmol/L (ref 135–145)
TCO2: 39 mmol/L — ABNORMAL HIGH (ref 22–32)
pCO2 arterial: 64.8 mmHg — ABNORMAL HIGH (ref 32.0–48.0)
pH, Arterial: 7.37 (ref 7.350–7.450)
pO2, Arterial: 162 mmHg — ABNORMAL HIGH (ref 83.0–108.0)

## 2020-04-10 LAB — MAGNESIUM: Magnesium: 2.1 mg/dL (ref 1.7–2.4)

## 2020-04-10 LAB — GLUCOSE, CAPILLARY
Glucose-Capillary: 111 mg/dL — ABNORMAL HIGH (ref 70–99)
Glucose-Capillary: 126 mg/dL — ABNORMAL HIGH (ref 70–99)
Glucose-Capillary: 138 mg/dL — ABNORMAL HIGH (ref 70–99)
Glucose-Capillary: 158 mg/dL — ABNORMAL HIGH (ref 70–99)
Glucose-Capillary: 165 mg/dL — ABNORMAL HIGH (ref 70–99)
Glucose-Capillary: 190 mg/dL — ABNORMAL HIGH (ref 70–99)

## 2020-04-10 LAB — RENAL FUNCTION PANEL
Albumin: 2.4 g/dL — ABNORMAL LOW (ref 3.5–5.0)
Anion gap: 4 — ABNORMAL LOW (ref 5–15)
BUN: 26 mg/dL — ABNORMAL HIGH (ref 6–20)
CO2: 36 mmol/L — ABNORMAL HIGH (ref 22–32)
Calcium: 8.1 mg/dL — ABNORMAL LOW (ref 8.9–10.3)
Chloride: 101 mmol/L (ref 98–111)
Creatinine, Ser: 0.53 mg/dL — ABNORMAL LOW (ref 0.61–1.24)
GFR, Estimated: >60 mL/min (ref >60)
Glucose, Bld: 118 mg/dL — ABNORMAL HIGH (ref 70–99)
Phosphorus: 2.9 mg/dL (ref 2.5–4.6)
Potassium: 4.1 mmol/L (ref 3.5–5.1)
Sodium: 141 mmol/L (ref 135–145)

## 2020-04-10 MED ORDER — ALBUTEROL SULFATE (2.5 MG/3ML) 0.083% IN NEBU: 2.5000 mg | INHALATION_SOLUTION | Freq: Four times a day (QID) | RESPIRATORY_TRACT | Status: DC | PRN

## 2020-04-10 NOTE — Therapy (Signed)
5am ABG results:  PH 7.37 PCO2 64.8 PO2 162 BE 10 HCO3 37.5

## 2020-04-10 NOTE — Progress Notes (Signed)
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  Treated with steroids and remdesivir. Received Actemra 10/3 & 10/6 .  Due to rising D-dimer lower extremity ultrasound and echo was obtained.  PCCM consulted 10/8  Past Medical History  None  Significant Hospital Events   10/2 hospital admission 10/8 subcutaneous air in neck and?  Right apical pneumothorax versus artifact 10/9 intubated due to prolonged desaturation to 70s without recovery 10/10 1 chest tube on left and 2 chest tubes placed on right due to recurrent ptx 10/11 transferred to [facility] for ECMO consideration 10/11 prone ventilation initiated 10/14 started on APRV 10/15 prone ventilation discontinued Consults:    Procedures:  ETT 10/9 >> Right IJ CVL 10/9 >> Right chest tube x2 10/10>> Lt Chest tube 10/10>>  Significant Diagnostic Tests:  Chest x-ray 10/8 subcutaneous air in neck,?  Right apical pneumothorax 10/7 venous duplex negative BLE 10/3 echo nmlLVEF  Micro Data:  10/2 Bc >> ng 10/07 MRSA PCR >> negative 10/9 resp >> GPC, GNR -abundant Haemophilus influenzae  Antimicrobials:  10/9 cefepime >> 10/9 vancomycin >> 10/12  Interim history/subjective:  Worsening hypoxia and right pneumothorax on APRV overnight.  Right CT adjusted and changed to [facility] with stabilization and resolving R PTX.  tmax 100.4 +1.8/ net -2.6L RN unable to wean sedation this morning due to agitation/ desaturation, remains on fentanyl 400 mcg/hr and versed 10 mg/hr Otherwise, hemodynamically stable   Objective   Blood pressure 100/71, pulse 83,  temperature 100.2 F (37.9 C), resp. rate (!) 30, height 5\' 11"  (1.803 m), weight 92.6 kg, SpO2 98 %.    Vent Mode: PRVC FiO2 (%):  [60 %-100 %] 60 % Set Rate:  [30 bmp] 30 bmp Vt Set:  [500 mL] 500 mL PEEP:  [0 cmH20-15 cmH20] 15 cmH20 Pressure Support:  [0 cmH20] 0 cmH20 Plateau Pressure:  [33 cmH20] 33 cmH20   Intake/Output Summary (Last 24 hours) at 04/10/2020 04/12/2020 Last data filed at 04/10/2020 0900 Gross per 24 hour  Intake 4385.14 ml  Output 2467 ml  Net 1918.14 ml   Filed Weights   04/08/20 0500 04/09/20 0356 04/10/20 0415  Weight: 89.9 kg 91.6 kg 92.6 kg   Examination: General:  Critically ill adult male intubated/ sedated on MV HEENT: MM pink/dry, pupils 5/reactive, scleral edema  Neuro:  Attempts to open eyes and slightly wiggles toes on command CV: rr, NSR PULM:  MV supported breaths, coarse throughout, 2 right sided CT with 1/7 air leak, Left CT -no air leak; Plat 33, driving pressure 18, down to 40-60% GI: soft, bs+, foley  Extremities: warm/dry, generalized +1 edema  Skin: no rashes   Resolved Hospital Problem list   AKI/hyperkalemia  Assessment & Plan:   Critically ill due to ARDS due to COVID pneumonia with improvement in oxygenation with prone ventilation. Continue mechanical ventilation per ARDS protocol Target TVol 6-8cc/kgIBW PRVC, currently at 6.5 cc/kgIBW Target Plateau Pressure < 30cm H20 Target driving pressure less than 15 cm of water Target PaO2 55-65: titrate PEEP/ FiO2 per protocol As long as PaO2 to FiO2 ratio is less than 1:150 position in prone position for 16 hours a day; PF  ratio this morning 162 Ventilator associated pneumonia prevention protocol PAD protocol with fentanyl/ versed gtt with enteral oxyIR, klonopin, seroquel, and prn hydroxyzine  Will start trying to slowly wean sedation today in hopes to start weaning PEEP today/ tomorrow   Bilateral pneumothoraces, pneumomediastinum with subcutaneous emphysema recurrent right-sided  pneumothorax today Continue CT CXR in am  Avoid APRV  COVID-19 pneumonia Completed remdesivir & received Actemra Continue methylprednisolone taper (last tapered 10/17)  Haemophilus influenzae pneumonia Ceftriaxone completed 10/18 for 7 day course  Hyperglycemia, exacerbated by steroids SSI moderate  Best practice:  Diet: EN Pain/Anxiety/Delirium protocol (if indicated): fentanyl and versed gtt, titrate to RASS of -1 to -2 VAP protocol (if indicated): N/A DVT prophylaxis: Lovenox GI prophylaxis: Protonix Glucose control: SSI while on steroids  Mobility: Bedrest  Code Status: Full Family Communication: Wife updated in detail on 10/16. Pending 10/18. Disposition: ICU  CRITICAL CARE Performed by: Posey Boyer  Total critical care time: 35 minutes  Critical care time was exclusive of separately billable procedures and treating other patients.  Critical care was necessary to treat or prevent imminent or life-threatening deterioration.  Critical care was time spent personally by me on the following activities: development of treatment plan with patient and/or surrogate as well as nursing, discussions with consultants, evaluation of patient's response to treatment, examination of patient, obtaining history from patient or surrogate, ordering and performing treatments and interventions, ordering and review of laboratory studies, ordering and review of radiographic studies, pulse oximetry, re-evaluation of patient's condition and participation in multidisciplinary rounds.   Posey Boyer, ACNP [facility] 04/10/2020, 9:22 AM  See Loretha Stapler for personal pager PCCM on call pager [phone]

## 2020-04-10 NOTE — Plan of Care (Signed)
This is a late entry-  I was at bedside around 10pm after being called for hypoxia and concern for expanding pneumothorax.  Patient was on APRV ventilation, then had sats drop to 70s.  Then placed back on PRVC, and sats improved to low 90s.  CXR showed worsening pneumothorax on right side with already 2 chest tubes in place.  Chest tube #2 on right sided had an intermittent air leak on suction.  Pull tube out slightly (1cm) and twisted it a bit to reposition and did have more air leak out with this.  Sats stayed at 93-94%.  Since patient had stabilzied, decision made to recheck CXR in 1 hour, holding on placing a 3rd tube at this time in the right chest.  Would avoid APRV, keep on PRVC.

## 2020-04-10 NOTE — Progress Notes (Signed)
  Called and updated patient's wife, [name] by phone.  All questions answered.        Posey Boyer, ACNP [facility] 04/10/2020, 1:44 PM

## 2020-04-11 ENCOUNTER — Inpatient Hospital Stay (HOSPITAL_COMMUNITY): Payer: 59

## 2020-04-11 DIAGNOSIS — U071 COVID-19: Secondary | ICD-10-CM | POA: Diagnosis not present

## 2020-04-11 DIAGNOSIS — J1282 Pneumonia due to coronavirus disease 2019: Secondary | ICD-10-CM

## 2020-04-11 DIAGNOSIS — Z9689 Presence of other specified functional implants: Secondary | ICD-10-CM | POA: Diagnosis not present

## 2020-04-11 DIAGNOSIS — J9601 Acute respiratory failure with hypoxia: Secondary | ICD-10-CM | POA: Diagnosis not present

## 2020-04-11 DIAGNOSIS — J80 Acute respiratory distress syndrome: Secondary | ICD-10-CM | POA: Diagnosis not present

## 2020-04-11 LAB — CBC WITH DIFFERENTIAL/PLATELET
Abs Immature Granulocytes: 0.17 10*3/uL — ABNORMAL HIGH (ref 0.00–0.07)
Basophils Absolute: 0 10*3/uL (ref 0.0–0.1)
Basophils Relative: 0 %
Eosinophils Absolute: 0.4 10*3/uL (ref 0.0–0.5)
Eosinophils Relative: 4 %
HCT: 38.7 % — ABNORMAL LOW (ref 39.0–52.0)
Hemoglobin: 12 g/dL — ABNORMAL LOW (ref 13.0–17.0)
Immature Granulocytes: 2 %
Lymphocytes Relative: 9 %
Lymphs Abs: 0.9 10*3/uL (ref 0.7–4.0)
MCH: 29.8 pg (ref 26.0–34.0)
MCHC: 31 g/dL (ref 30.0–36.0)
MCV: 96 fL (ref 80.0–100.0)
Monocytes Absolute: 0.3 10*3/uL (ref 0.1–1.0)
Monocytes Relative: 4 %
Neutro Abs: 7.6 10*3/uL (ref 1.7–7.7)
Neutrophils Relative %: 81 %
Platelets: 129 10*3/uL — ABNORMAL LOW (ref 150–400)
RBC: 4.03 MIL/uL — ABNORMAL LOW (ref 4.22–5.81)
RDW: 13.2 % (ref 11.5–15.5)
WBC: 9.4 10*3/uL (ref 4.0–10.5)
nRBC: 0.2 % (ref 0.0–0.2)

## 2020-04-11 LAB — BASIC METABOLIC PANEL
Anion gap: 8 (ref 5–15)
BUN: 29 mg/dL — ABNORMAL HIGH (ref 6–20)
CO2: 34 mmol/L — ABNORMAL HIGH (ref 22–32)
Calcium: 8.2 mg/dL — ABNORMAL LOW (ref 8.9–10.3)
Chloride: 100 mmol/L (ref 98–111)
Creatinine, Ser: 0.45 mg/dL — ABNORMAL LOW (ref 0.61–1.24)
GFR, Estimated: >60 mL/min (ref >60)
Glucose, Bld: 116 mg/dL — ABNORMAL HIGH (ref 70–99)
Potassium: 4.1 mmol/L (ref 3.5–5.1)
Sodium: 142 mmol/L (ref 135–145)

## 2020-04-11 LAB — POCT I-STAT 7, (LYTES, BLD GAS, ICA,H+H)
Acid-Base Excess: 9 mmol/L — ABNORMAL HIGH (ref 0.0–2.0)
Bicarbonate: 35.2 mmol/L — ABNORMAL HIGH (ref 20.0–28.0)
Calcium, Ion: 1.15 mmol/L (ref 1.15–1.40)
HCT: 35 % — ABNORMAL LOW (ref 39.0–52.0)
Hemoglobin: 11.9 g/dL — ABNORMAL LOW (ref 13.0–17.0)
O2 Saturation: 95 %
Patient temperature: 98.6
Potassium: 4.8 mmol/L (ref 3.5–5.1)
Sodium: 141 mmol/L (ref 135–145)
TCO2: 37 mmol/L — ABNORMAL HIGH (ref 22–32)
pCO2 arterial: 57.1 mmHg — ABNORMAL HIGH (ref 32.0–48.0)
pH, Arterial: 7.397 (ref 7.350–7.450)
pO2, Arterial: 80 mmHg — ABNORMAL LOW (ref 83.0–108.0)

## 2020-04-11 LAB — GLUCOSE, CAPILLARY
Glucose-Capillary: 107 mg/dL — ABNORMAL HIGH (ref 70–99)
Glucose-Capillary: 111 mg/dL — ABNORMAL HIGH (ref 70–99)
Glucose-Capillary: 115 mg/dL — ABNORMAL HIGH (ref 70–99)
Glucose-Capillary: 162 mg/dL — ABNORMAL HIGH (ref 70–99)
Glucose-Capillary: 174 mg/dL — ABNORMAL HIGH (ref 70–99)
Glucose-Capillary: 208 mg/dL — ABNORMAL HIGH (ref 70–99)

## 2020-04-11 MED ORDER — METHYLPREDNISOLONE SODIUM SUCC 40 MG IJ SOLR
40.0000 mg | INTRAMUSCULAR | Status: AC
  Administered 2020-04-12 – 2020-04-13 (×2): 40 mg via INTRAVENOUS
  Filled 2020-04-11 (×2): qty 1

## 2020-04-11 NOTE — Progress Notes (Signed)
Pt's wife, [name] called and updated.  Darcella Gasman Delmi Fulfer, PA-C

## 2020-04-11 NOTE — Progress Notes (Signed)
Video call with family 

## 2020-04-11 NOTE — Progress Notes (Signed)
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  Treated with steroids and remdesivir. Received Actemra 10/3 & 10/6 .  Due to rising D-dimer lower extremity ultrasound and echo was obtained.  PCCM consulted 10/8  Past Medical History  None  Significant Hospital Events   10/2 hospital admission 10/8 subcutaneous air in neck and?  Right apical pneumothorax versus artifact 10/9 intubated due to prolonged desaturation to 70s without recovery 10/10 1 chest tube on left and 2 chest tubes placed on right due to recurrent ptx 10/11 transferred to [facility] for ECMO consideration 10/11 prone ventilation initiated 10/14 started on APRV 10/15 prone ventilation discontinued 10/19 Recurrent R pneumo despite two R-sided chest tubes, suction increased to 40 with resolution  Consults:    Procedures:  ETT 10/9 >> Right IJ CVL 10/9 >> Right chest tube x2 10/10>> Lt Chest tube 10/10>>  Significant Diagnostic Tests:  Chest x-ray 10/8 subcutaneous air in neck,?  Right apical pneumothorax 10/7 venous duplex negative BLE 10/3 echo nmlLVEF  Micro Data:  10/2 Bc >> ng 10/07 MRSA PCR >> negative 10/9 resp >> GPC, GNR -abundant Haemophilus influenzae  Antimicrobials:  10/9 cefepime >>10/18 10/9 vancomycin >> 10/12  Interim history/subjective:  CXR early this AM with increasing R-sided pneumo despite 2 R-sided chest tubes, suction increased to 40 with resolution on repeat CXR  Objective   Blood pressure 119/78, pulse 73, temperature (!) 100.4 F (38 C), resp. rate (!) 30, height 5\' 11"  (1.803 m), weight 95.4 kg,  SpO2 96 %.    Vent Mode: PRVC FiO2 (%):  [40 %-70 %] 50 % Set Rate:  [30 bmp] 30 bmp Vt Set:  [500 mL] 500 mL PEEP:  [15 cmH20] 15 cmH20 Plateau Pressure:  [31 cmH20-34 cmH20] 31 cmH20   Intake/Output Summary (Last 24 hours) at 04/11/2020 0912 Last data filed at 04/11/2020 0700 Gross per 24 hour  Intake 1753 ml  Output 1991 ml  Net -238 ml   Filed Weights   04/09/20 0356 04/10/20 0415 04/11/20 0325  Weight: 91.6 kg 92.6 kg 95.4 kg   General:  Critically ill-appearing M sedated on ventilator  HEENT: MM pink/moist, ETT in place Neuro: opens eyes to voice, triggering vent, +gag, PERRL, does not withdraw to pain CV: s1s2 rrr, no m/r/g,  PULM:  Two R-sided chest tubes and one L without air leaks GI: soft, bsx4 active  Extremities: warm/dry, 1+ edema  Skin: no rashes or lesions   Resolved Hospital Problem list   AKI/hyperkalemia  Assessment & Plan:    Acute Hypoxic Respiratory Failure secondary to Covid-19 and H. Flu PNA Continue mechanical ventilation per ARDS protocol P: -Attempt to wean PEEP today, FiO2 increased to 100% and PEEP decreased from 15 to 12 -Maintain full vent support with SAT/SBT as tolerated, attempting to titrate down Fentanyl/Versed, continue Klonopin and Seroquel -titrate Vent setting to maintain SpO2 greater than or equal to 85%. -HOB elevated 30 degrees. -Plateau pressures less than 30 cm H20. Goal driving pressures 04/13/20 -Follow chest x-ray, ABG prn.   -Bronchial hygiene and RT/bronchodilator protocol. synchronous with vent -Repeat ABG in the AM   Bilateral pneumothoraces, pneumomediastinum with subcutaneous emphysema recurrent  right-sided pneumothorax today P: -continue bilateral chest tubes with increased suction -hope to decrease PEEP and sedation with goal of extubation to reduce positive pressure   COVID-19 pneumonia Completed remdesivir & received Actemra Continue methylprednisolone taper, ast tapered 10/17 to 50mg  q24hrs, decrease to  40mg  q24hrs today  Haemophilus influenzae pneumonia Ceftriaxone completed 10/18 for 7 day course  Hyperglycemia,  exacerbated by steroids P: -continue SSI   Best practice:  Diet: EN Pain/Anxiety/Delirium protocol (if indicated): fentanyl and versed gtt, titrate to RASS of -1 to -2 VAP protocol (if indicated): HOB 30 degrees, suction prn DVT prophylaxis: Lovenox GI prophylaxis: Protonix Glucose control: SSI while on steroids  Mobility: Bedrest  Code Status: Full Family Communication: family update pending 10/19 Disposition: ICU  CRITICAL CARE Performed by: 11/18 Cataleyah Colborn  Total critical care time: 40 minutes  Critical care time was exclusive of separately billable procedures and treating other patients.  Critical care was necessary to treat or prevent imminent or life-threatening deterioration.  Critical care was time spent personally by me on the following activities: development of treatment plan with patient and/or surrogate as well as nursing, discussions with consultants, evaluation of patient's response to treatment, examination of patient, obtaining history from patient or surrogate, ordering and performing treatments and interventions, ordering and review of laboratory studies, ordering and review of radiographic studies, pulse oximetry, re-evaluation of patient's condition and participation in multidisciplinary rounds.   11/19, ACNP [facility] 04/11/2020, 9:12 AM  See Posey Boyer for personal pager PCCM on call pager [phone]

## 2020-04-11 NOTE — Progress Notes (Signed)
eLink Physician-Brief Progress Note Patient Name: Dennis Howe [name]: 1976-03-15 [name]: [id number]   Date of Service  04/11/2020  HPI/Events of Note  Cxr: Film reviewed and compared to 17, 18 th, with increasing rt sided pneumo.   Lower ICD at 40 and upper apical ICD at 20 wall suction.  eICU Interventions  - Increase upper ICD to 40 wall suction and follow CxR in an hour. If not better need CVTS opinion for recurring pneumo.      Intervention Category Intermediate Interventions: Respiratory distress - evaluation and management;Diagnostic test evaluation  Ranee Gosselin 04/11/2020, 6:13 AM

## 2020-04-11 NOTE — Progress Notes (Signed)
CRITICAL VALUE ALERT  Critical Value: Critical chest xray report  Date & Time Notified: 04/11/20 @ 0600  Provider Notified: Dr. Marlane Mingle  Orders Received/Actions taken: MD aware, chest tube #1 increased to -40cm suction

## 2020-04-12 ENCOUNTER — Inpatient Hospital Stay (HOSPITAL_COMMUNITY): Payer: 59

## 2020-04-12 DIAGNOSIS — U071 COVID-19: Secondary | ICD-10-CM | POA: Diagnosis not present

## 2020-04-12 DIAGNOSIS — J9601 Acute respiratory failure with hypoxia: Secondary | ICD-10-CM | POA: Diagnosis not present

## 2020-04-12 LAB — BASIC METABOLIC PANEL
Anion gap: 8 (ref 5–15)
BUN: 25 mg/dL — ABNORMAL HIGH (ref 6–20)
CO2: 31 mmol/L (ref 22–32)
Calcium: 7.9 mg/dL — ABNORMAL LOW (ref 8.9–10.3)
Chloride: 105 mmol/L (ref 98–111)
Creatinine, Ser: 0.56 mg/dL — ABNORMAL LOW (ref 0.61–1.24)
GFR, Estimated: >60 mL/min (ref >60)
Glucose, Bld: 99 mg/dL (ref 70–99)
Potassium: 3.8 mmol/L (ref 3.5–5.1)
Sodium: 144 mmol/L (ref 135–145)

## 2020-04-12 LAB — CBC WITH DIFFERENTIAL/PLATELET
Abs Immature Granulocytes: 0.09 10*3/uL — ABNORMAL HIGH (ref 0.00–0.07)
Basophils Absolute: 0 10*3/uL (ref 0.0–0.1)
Basophils Relative: 0 %
Eosinophils Absolute: 0.3 10*3/uL (ref 0.0–0.5)
Eosinophils Relative: 5 %
HCT: 33.4 % — ABNORMAL LOW (ref 39.0–52.0)
Hemoglobin: 10.5 g/dL — ABNORMAL LOW (ref 13.0–17.0)
Immature Granulocytes: 1 %
Lymphocytes Relative: 13 %
Lymphs Abs: 0.9 10*3/uL (ref 0.7–4.0)
MCH: 29.7 pg (ref 26.0–34.0)
MCHC: 31.4 g/dL (ref 30.0–36.0)
MCV: 94.4 fL (ref 80.0–100.0)
Monocytes Absolute: 0.2 10*3/uL (ref 0.1–1.0)
Monocytes Relative: 3 %
Neutro Abs: 5.2 10*3/uL (ref 1.7–7.7)
Neutrophils Relative %: 78 %
Platelets: 118 10*3/uL — ABNORMAL LOW (ref 150–400)
RBC: 3.54 MIL/uL — ABNORMAL LOW (ref 4.22–5.81)
RDW: 13.4 % (ref 11.5–15.5)
WBC: 6.8 10*3/uL (ref 4.0–10.5)
nRBC: 0 % (ref 0.0–0.2)

## 2020-04-12 LAB — POCT I-STAT 7, (LYTES, BLD GAS, ICA,H+H)
Acid-Base Excess: 7 mmol/L — ABNORMAL HIGH (ref 0.0–2.0)
Bicarbonate: 33.5 mmol/L — ABNORMAL HIGH (ref 20.0–28.0)
Calcium, Ion: 1.11 mmol/L — ABNORMAL LOW (ref 1.15–1.40)
HCT: 34 % — ABNORMAL LOW (ref 39.0–52.0)
Hemoglobin: 11.6 g/dL — ABNORMAL LOW (ref 13.0–17.0)
O2 Saturation: 84 %
Potassium: 4.3 mmol/L (ref 3.5–5.1)
Sodium: 141 mmol/L (ref 135–145)
TCO2: 35 mmol/L — ABNORMAL HIGH (ref 22–32)
pCO2 arterial: 54.6 mmHg — ABNORMAL HIGH (ref 32.0–48.0)
pH, Arterial: 7.396 (ref 7.350–7.450)
pO2, Arterial: 50 mmHg — ABNORMAL LOW (ref 83.0–108.0)

## 2020-04-12 LAB — MAGNESIUM: Magnesium: 2.4 mg/dL (ref 1.7–2.4)

## 2020-04-12 LAB — GLUCOSE, CAPILLARY
Glucose-Capillary: 121 mg/dL — ABNORMAL HIGH (ref 70–99)
Glucose-Capillary: 127 mg/dL — ABNORMAL HIGH (ref 70–99)
Glucose-Capillary: 132 mg/dL — ABNORMAL HIGH (ref 70–99)
Glucose-Capillary: 180 mg/dL — ABNORMAL HIGH (ref 70–99)
Glucose-Capillary: 202 mg/dL — ABNORMAL HIGH (ref 70–99)
Glucose-Capillary: 95 mg/dL (ref 70–99)

## 2020-04-12 LAB — PHOSPHORUS: Phosphorus: 3 mg/dL (ref 2.5–4.6)

## 2020-04-12 MED ORDER — METHYLPREDNISOLONE SODIUM SUCC 40 MG IJ SOLR
20.0000 mg | Freq: Every day | INTRAMUSCULAR | Status: AC
  Administered 2020-04-14 – 2020-04-16 (×3): 20 mg via INTRAVENOUS
  Filled 2020-04-12 (×3): qty 1

## 2020-04-12 NOTE — Progress Notes (Signed)
Video call with several family members. Patient unresponsive on ventilator and sedation.

## 2020-04-12 NOTE — Progress Notes (Signed)
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  Treated with steroids and remdesivir. Received Actemra 10/3 & 10/6 .  Due to rising D-dimer lower extremity ultrasound and echo was obtained.  PCCM consulted 10/8  Past Medical History  None  Significant Hospital Events   10/2 hospital admission 10/8 subcutaneous air in neck and?  Right apical pneumothorax versus artifact 10/9 intubated due to prolonged desaturation to 70s without recovery 10/10 1 chest tube on left and 2 chest tubes placed on right due to recurrent ptx 10/11 transferred to [facility] for ECMO consideration 10/11 prone ventilation initiated 10/14 started on APRV 10/15 prone ventilation discontinued 10/19 Recurrent R pneumo despite two R-sided chest tubes, suction increased to 40 with resolution  Consults:    Procedures:  ETT 10/9 >> Right IJ CVL 10/9 >> Right chest tube x2 10/10>> Lt Chest tube 10/10>>  Significant Diagnostic Tests:  Chest x-ray 10/8 subcutaneous air in neck,?  Right apical pneumothorax 10/7 venous duplex negative BLE 10/3 echo nmlLVEF  Micro Data:  10/2 Bc >> ng 10/07 MRSA PCR >> negative 10/9 resp >> GPC, GNR -abundant Haemophilus influenzae  Antimicrobials:  10/9 cefepime >>10/18 10/9 vancomycin >> 10/12  Interim history/subjective:  Making progress today weaning PEEP and sedation, pt following commands   Objective   Blood pressure (!) 94/56, pulse 68, temperature 98.4 F (36.9 C), resp. rate (!) 30, height 5\' 11"  (1.803 m), weight 96.2 kg, SpO2 95 %.    Vent Mode: PRVC FiO2 (%):  [40 %-90 %] 40  % Set Rate:  [30 bmp] 30 bmp Vt Set:  [500 mL] 500 mL PEEP:  [10 cmH20-15 cmH20] 10 cmH20 Plateau Pressure:  [28 cmH20-33 cmH20] 30 cmH20   Intake/Output Summary (Last 24 hours) at 04/12/2020 0713 Last data filed at 04/12/2020 0700 Gross per 24 hour  Intake 1589.91 ml  Output 1530 ml  Net 59.91 ml   Filed Weights   04/10/20 0415 04/11/20 0325 04/12/20 0353  Weight: 92.6 kg 95.4 kg 96.2 kg   General:  Critically ill-appearing M sedated on ventilator  HEENT: MM pink/moist, ETT in place Neuro:  Responsive on Fentanyl and Versed, following commands, PERRL CV: s1s2 rrr, no m/r/g,  PULM:  Two R-sided chest tubes, persistent air leak, one L without air leak, minimal breath sounds throughout GI: soft, bsx4 active  Extremities: warm/dry, 1+ edema  Skin: no rashes or lesions   Resolved Hospital Problem list   AKI/hyperkalemia  Assessment & Plan:    Acute Hypoxic Respiratory Failure secondary to Covid-19 and H. Flu PNA Continue mechanical ventilation per ARDS protocol, cautiously optimistic as he is waking up and  PEEP weaned to 8 with FiO2 60% and maintaining sats>90% P: -Continue sedation wean today as able, goal PEEP of 5 in the next couple of days and SBT/SAT -P/f ratio 200 -continue po Dilaudid and Klonopin  -Titrate Vent setting to maintain SpO2 greater than or equal to 85%. -HOB elevated 30 degrees. -Plateau pressures less than 30 cm H20. Goal driving pressures 04/14/20 -Follow chest x-ray, ABG prn.   -Bronchial hygiene and RT/bronchodilator protocol. synchronous with vent -Repeat ABG in the AM   Bilateral pneumothoraces,  pneumomediastinum with subcutaneous emphysema recurrent right-sided pneumothorax today P: -continue bilateral chest tubes with increased suction -hope to decrease PEEP and sedation with goal of extubation to reduce positive pressure   COVID-19 pneumonia Completed remdesivir & received Actemra Continue methylprednisolone taper, ast tapered 10/17 to 50mg   q24hrs, decrease to 40mg  q24hrs today  Haemophilus influenzae pneumonia Ceftriaxone completed 10/18 for 7 day course  Hyperglycemia,  exacerbated by steroids P: -continue SSI   Best practice:  Diet: EN Pain/Anxiety/Delirium protocol (if indicated): fentanyl and versed gtt, titrate to RASS of -1 to -2 VAP protocol (if indicated): HOB 30 degrees, suction prn DVT prophylaxis: Lovenox GI prophylaxis: Protonix Glucose control: SSI while on steroids  Mobility: Bedrest  Code Status: Full Family Communication: pt's wife updated Disposition: ICU  CRITICAL CARE Performed by: 11/18 Reathel Turi  Total critical care time: 35 minutes  Critical care time was exclusive of separately billable procedures and treating other patients.  Critical care was necessary to treat or prevent imminent or life-threatening deterioration.  Critical care was time spent personally by me on the following activities: development of treatment plan with patient and/or surrogate as well as nursing, discussions with consultants, evaluation of patient's response to treatment, examination of patient, obtaining history from patient or surrogate, ordering and performing treatments and interventions, ordering and review of laboratory studies, ordering and review of radiographic studies, pulse oximetry, re-evaluation of patient's condition and participation in multidisciplinary rounds.   [place] Willmer Fellers, PA-C [facility]  Pager# [phone], if no answer [phone]

## 2020-04-13 ENCOUNTER — Inpatient Hospital Stay (HOSPITAL_COMMUNITY): Payer: 59

## 2020-04-13 DIAGNOSIS — J80 Acute respiratory distress syndrome: Secondary | ICD-10-CM | POA: Diagnosis not present

## 2020-04-13 DIAGNOSIS — U071 COVID-19: Secondary | ICD-10-CM | POA: Diagnosis not present

## 2020-04-13 DIAGNOSIS — J9600 Acute respiratory failure, unspecified whether with hypoxia or hypercapnia: Secondary | ICD-10-CM

## 2020-04-13 DIAGNOSIS — J9601 Acute respiratory failure with hypoxia: Secondary | ICD-10-CM | POA: Diagnosis not present

## 2020-04-13 LAB — GLUCOSE, CAPILLARY
Glucose-Capillary: 123 mg/dL — ABNORMAL HIGH (ref 70–99)
Glucose-Capillary: 134 mg/dL — ABNORMAL HIGH (ref 70–99)
Glucose-Capillary: 180 mg/dL — ABNORMAL HIGH (ref 70–99)
Glucose-Capillary: 229 mg/dL — ABNORMAL HIGH (ref 70–99)
Glucose-Capillary: 74 mg/dL (ref 70–99)
Glucose-Capillary: 93 mg/dL (ref 70–99)

## 2020-04-13 LAB — BASIC METABOLIC PANEL
Anion gap: 5 (ref 5–15)
BUN: 23 mg/dL — ABNORMAL HIGH (ref 6–20)
CO2: 31 mmol/L (ref 22–32)
Calcium: 7.9 mg/dL — ABNORMAL LOW (ref 8.9–10.3)
Chloride: 105 mmol/L (ref 98–111)
Creatinine, Ser: 0.42 mg/dL — ABNORMAL LOW (ref 0.61–1.24)
GFR, Estimated: >60 mL/min (ref >60)
Glucose, Bld: 97 mg/dL (ref 70–99)
Potassium: 3.9 mmol/L (ref 3.5–5.1)
Sodium: 141 mmol/L (ref 135–145)

## 2020-04-13 LAB — POCT I-STAT 7, (LYTES, BLD GAS, ICA,H+H)
Acid-Base Excess: 6 mmol/L — ABNORMAL HIGH (ref 0.0–2.0)
Bicarbonate: 31.7 mmol/L — ABNORMAL HIGH (ref 20.0–28.0)
Calcium, Ion: 1.17 mmol/L (ref 1.15–1.40)
HCT: 33 % — ABNORMAL LOW (ref 39.0–52.0)
Hemoglobin: 11.2 g/dL — ABNORMAL LOW (ref 13.0–17.0)
O2 Saturation: 84 %
Patient temperature: 99.6
Potassium: 3.9 mmol/L (ref 3.5–5.1)
Sodium: 141 mmol/L (ref 135–145)
TCO2: 33 mmol/L — ABNORMAL HIGH (ref 22–32)
pCO2 arterial: 49.9 mmHg — ABNORMAL HIGH (ref 32.0–48.0)
pH, Arterial: 7.413 (ref 7.350–7.450)
pO2, Arterial: 51 mmHg — ABNORMAL LOW (ref 83.0–108.0)

## 2020-04-13 LAB — CBC WITH DIFFERENTIAL/PLATELET
Abs Immature Granulocytes: 0.06 10*3/uL (ref 0.00–0.07)
Basophils Absolute: 0 10*3/uL (ref 0.0–0.1)
Basophils Relative: 0 %
Eosinophils Absolute: 0.4 10*3/uL (ref 0.0–0.5)
Eosinophils Relative: 5 %
HCT: 35.4 % — ABNORMAL LOW (ref 39.0–52.0)
Hemoglobin: 11.3 g/dL — ABNORMAL LOW (ref 13.0–17.0)
Immature Granulocytes: 1 %
Lymphocytes Relative: 12 %
Lymphs Abs: 0.9 10*3/uL (ref 0.7–4.0)
MCH: 29.9 pg (ref 26.0–34.0)
MCHC: 31.9 g/dL (ref 30.0–36.0)
MCV: 93.7 fL (ref 80.0–100.0)
Monocytes Absolute: 0.4 10*3/uL (ref 0.1–1.0)
Monocytes Relative: 5 %
Neutro Abs: 5.9 10*3/uL (ref 1.7–7.7)
Neutrophils Relative %: 77 %
Platelets: 119 10*3/uL — ABNORMAL LOW (ref 150–400)
RBC: 3.78 MIL/uL — ABNORMAL LOW (ref 4.22–5.81)
RDW: 13.6 % (ref 11.5–15.5)
WBC: 7.7 10*3/uL (ref 4.0–10.5)
nRBC: 0 % (ref 0.0–0.2)

## 2020-04-13 LAB — MAGNESIUM: Magnesium: 2.3 mg/dL (ref 1.7–2.4)

## 2020-04-13 LAB — PHOSPHORUS: Phosphorus: 3.7 mg/dL (ref 2.5–4.6)

## 2020-04-13 MED ORDER — SODIUM CHLORIDE 0.9 % IV SOLN
2.0000 g | INTRAVENOUS | Status: DC
  Administered 2020-04-13 – 2020-04-16 (×4): 2 g via INTRAVENOUS
  Filled 2020-04-13 (×4): qty 20

## 2020-04-13 MED ORDER — VITAL 1.5 CAL PO LIQD
1000.0000 mL | ORAL | Status: DC
  Administered 2020-04-13 – 2020-04-17 (×6): 1000 mL
  Filled 2020-04-13 (×9): qty 1000

## 2020-04-13 MED ORDER — SODIUM CHLORIDE 0.9 % IV SOLN: 1.0000 g | INTRAVENOUS | Status: DC

## 2020-04-13 MED ORDER — OXYCODONE HCL 5 MG/5ML PO SOLN
10.0000 mg | ORAL | Status: DC
  Administered 2020-04-13 – 2020-04-16 (×18): 10 mg
  Filled 2020-04-13 (×19): qty 10

## 2020-04-13 MED ORDER — PROSOURCE TF PO LIQD
45.0000 mL | Freq: Every day | ORAL | Status: DC
  Administered 2020-04-13 – 2020-04-29 (×81): 45 mL
  Filled 2020-04-13 (×76): qty 45

## 2020-04-13 MED ORDER — FUROSEMIDE 10 MG/ML IJ SOLN
40.0000 mg | Freq: Once | INTRAMUSCULAR | Status: AC
  Administered 2020-04-13: 40 mg via INTRAVENOUS
  Filled 2020-04-13: qty 4

## 2020-04-13 MED ORDER — QUETIAPINE FUMARATE 100 MG PO TABS
100.0000 mg | ORAL_TABLET | Freq: Two times a day (BID) | ORAL | Status: DC
  Administered 2020-04-13 – 2020-04-14 (×3): 100 mg
  Filled 2020-04-13 (×3): qty 1

## 2020-04-13 MED ORDER — CLONAZEPAM 0.5 MG PO TBDP
2.0000 mg | ORAL_TABLET | Freq: Three times a day (TID) | ORAL | Status: DC
  Administered 2020-04-13 – 2020-04-25 (×39): 2 mg
  Filled 2020-04-13 (×18): qty 4
  Filled 2020-04-13: qty 16
  Filled 2020-04-13 (×20): qty 4

## 2020-04-13 NOTE — Progress Notes (Addendum)
Nutrition Follow-up  DOCUMENTATION CODES:   Not applicable  INTERVENTION:   Tube feeding via Cortrak tube (tip gastric):   Vital 1.5 @ 70 ml/hr (1680 ml/day) 45 ml ProSource TF five times per day  Provides: 2720 kcal, 168 grams protein, and 1280 ml free water.    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  Ongoing.   GOAL:   Patient will meet greater than or equal to 90% of their needs  Met with TF.   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 He has received remdesivir course and received actemra on 10/3 and 10/6. He has no known medical history.  Per MD pt is following commands; attempting to wean sedation. Pt with thick secretions.   10/2 admitted to [facility] 10/9 intubated due to worsening hypoxia  10/10 3 chest tubes placed du to recurrent PTX 10/11 tx to 34M for [place] consideration, pt is a candidate but deferred for now; pt started proning 10/15 Cortrak tube; tip in stomach   Patient is currently intubated on ventilator support MV: 18.3 L/min Temp (24hrs), Avg:99 F (37.2 C), Min:98.1 F (36.7 C), Max:100 F (37.8 C)  Medications reviewed and include: vitamin C, colace, nutrisource fiber BID, SSI, solumedrol, zinc  Fentanyl Versed  Labs reviewed   I&O: + 1.4 L since admission Chest tubes: 1: 20 ml 2: 30 ml 3: 50 ml  Current TF:  Vital 1.5 @ 60 ml/hr (1440 ml/day) 90 ml ProSource TF QID Provides: 2480 kcal, 185 grams protein    Diet Order:   Diet Order            Diet NPO time specified  Diet effective now                 EDUCATION NEEDS:   No education needs have been identified at this  time  Skin:  Skin Assessment: Reviewed RN Assessment  Last BM:  150 ml via rectal tube  Height:   Ht Readings from Last 1 Encounters:  04/03/20 _0  (1.803 m)    Weight:   Wt Readings from Last 1 Encounters:  04/13/20 97 kg    Ideal Body Weight:     BMI:  Body mass index is 29.83 kg/m.  Estimated Nutritional Needs:   Kcal:  2500-2800  Protein:  160-178 grams (1.8-2 grams/kg)  Fluid:  >/= 2.8 L/day  Lockie Pares., RD, LDN, CNSC See AMiON for contact information

## 2020-04-13 NOTE — Progress Notes (Signed)
Assisted tele visit to patient with family member.  Kayvion Arneson D Keriann Rankin, RN   

## 2020-04-13 NOTE — Progress Notes (Signed)
eLink Physician-Brief Progress Note Patient Name: Dennis Howe [name]: Dec 28, 1975 [name]: [id number]   Date of Service  04/13/2020  HPI/Events of Note  Patient needs restraints order renewed.  eICU Interventions  Restraints renewed.        Thomasene Lot Rosalinda Seaman 04/13/2020, 10:02 PM

## 2020-04-13 NOTE — Progress Notes (Signed)
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  Treated with steroids and remdesivir. Received Actemra 10/3 & 10/6 .  Due to rising D-dimer lower extremity ultrasound and echo was obtained.  PCCM consulted 10/8  Past Medical History  None  Significant Hospital Events   10/2 hospital admission 10/8 subcutaneous air in neck and?  Right apical pneumothorax versus artifact 10/9 intubated due to prolonged desaturation to 70s without recovery 10/10 1 chest tube on left and 2 chest tubes placed on right due to recurrent ptx 10/11 transferred to [facility] for ECMO consideration 10/11 prone ventilation initiated 10/14 started on APRV 10/15 prone ventilation discontinued 10/19 Recurrent R pneumo despite two R-sided chest tubes, suction increased to 40 with resolution  Consults:    Procedures:  ETT 10/9 >> Right IJ CVL 10/9 >> Right chest tube x2 10/10>> Lt Chest tube 10/10>>  Significant Diagnostic Tests:  Chest x-ray 10/8 subcutaneous air in neck,?  Right apical pneumothorax 10/7 venous duplex negative BLE 10/3 echo nmlLVEF  Micro Data:  10/2 Bc >> ng 10/07 MRSA PCR >> negative 10/9 resp >> GPC, GNR -abundant Haemophilus influenzae  Antimicrobials:  cefepime10/9 -10/11 Vancomycin10/9- 10/12 Ceftriaxone 10/12-10/18, 10/21-  Interim history/subjective:  No overnight events, PEEP down to 5, difficulty weaning sedation below Fentanyl Versed 8mg    Objective   Blood pressure (!) 163/99, pulse 95, temperature 99.7 F (37.6 C), resp. rate (!) 36, height 5\' 11"  (1.803 m), weight 97 kg, SpO2 94  %.    Vent Mode: PRVC FiO2 (%):  [50 %-80 %] 80 % Set Rate:  [24 bmp-26 bmp] 26 bmp Vt Set:  [500 mL-550 mL] 500 mL PEEP:  [5 cmH20] 5 cmH20 Plateau Pressure:  [27 cmH20-31 cmH20] 31 cmH20   Intake/Output Summary (Last 24 hours) at 04/13/2020 0837 Last data filed at 04/13/2020 0700 Gross per 24 hour  Intake 1666.54 ml  Output 1895 ml  Net -228.46 ml   Filed Weights   04/11/20 0325 04/12/20 0353 04/13/20 0347  Weight: 95.4 kg 96.2 kg 97 kg   General:  Critically ill-appearing M, mildly agitated  HEENT: MM pink/moist, ETT in place Neuro:  Pupils equal, mildly agitated, though following commands CV: s1s2 rrr, no m/r/g,  PULM:  Two R-sided and one L-sided chest tube, no air leak today,  Improved breath sounds today with mild expiratory wheezing GI: soft, bsx4 active  Extremities: warm/dry, 1+ edema  Skin: no rashes or lesions  CXR personally reviewed, small R apical Pneumo, persistent bilateral infiltrates  Resolved Hospital Problem list   AKI/hyperkalemia  Assessment & Plan:    Acute Hypoxic Respiratory Failure secondary to Covid-19 and H. Flu PNA Continue mechanical ventilation per ARDS protocol, continued PEEP wean to 5 today, however de-saturating and dyssynchronous with vent with lower sedation, P/F ratio down to 85 with increased thick secretions P: -Increase po Oxy 10mg  q6h to q4hr and Klonopin 1mg  to 2mg  in attempt to decrease Versed and Fentanyl and SBT -Add chest PT, re-culture Sputum, completed course of Ceftriaxone for H. flu, resume cephalosporin pending culture results -Lasix 40mg  x1 and monitor UOP -Titrate Vent setting to maintain SpO2 greater than or equal to  85%. -HOB elevated 30 degrees. -Continue ARDSnet vent protocol 6cc/kg -Plateau pressures less than 30 cm H20. Goal driving pressures <43 -Follow chest x-ray, ABG prn.   -Bronchial hygiene and RT/bronchodilator protocol. -Repeat ABG in the AM   Bilateral pneumothoraces, pneumomediastinum with  subcutaneous emphysema recurrent right-sided pneumothorax today No air leak today with stable, small residual R apical pneumo P: -continue bilateral chest tubes with increased suction -hope to decrease PEEP and sedation with goal of extubation to reduce positive pressure   COVID-19 pneumonia Completed remdesivir & received Actemra Continue solumedrol taper 20mg  qd until 10/25   Haemophilus influenzae pneumonia Ceftriaxone completed 10/18, resume 10/21 2/2 increasing thick secretions and re-culture    Hyperglycemia,  exacerbated by steroids P: -continue SSI   Best practice:  Diet: EN Pain/Anxiety/Delirium protocol (if indicated): fentanyl and versed gtt, titrate to RASS of -1 to -2 VAP protocol (if indicated): HOB 30 degrees, suction prn DVT prophylaxis: Lovenox GI prophylaxis: Protonix Glucose control: SSI while on steroids  Mobility: Bedrest  Code Status: Full Family Communication: Pt's wife 11/21 updated 10/21 Disposition: ICU  CRITICAL CARE Performed by: 11/21 Spyros Winch  Total critical care time: 40 minutes  Critical care time was exclusive of separately billable procedures and treating other patients.  Critical care was necessary to treat or prevent imminent or life-threatening deterioration.  Critical care was time spent personally by me on the following activities: development of treatment plan with patient and/or surrogate as well as nursing, discussions with consultants, evaluation of patient's response to treatment, examination of patient, obtaining history from patient or surrogate, ordering and performing treatments and interventions, ordering and review of laboratory studies, ordering and review of radiographic studies, pulse oximetry, re-evaluation of patient's condition and participation in multidisciplinary rounds.   Darcella Gasman Twana Wileman, PA-C [facility]  Pager# [phone], if no answer [phone]

## 2020-04-14 ENCOUNTER — Inpatient Hospital Stay (HOSPITAL_COMMUNITY): Payer: 59

## 2020-04-14 DIAGNOSIS — J939 Pneumothorax, unspecified: Secondary | ICD-10-CM

## 2020-04-14 LAB — CBC WITH DIFFERENTIAL/PLATELET
Abs Immature Granulocytes: 0.04 10*3/uL (ref 0.00–0.07)
Basophils Absolute: 0 10*3/uL (ref 0.0–0.1)
Basophils Relative: 0 %
Eosinophils Absolute: 0.5 10*3/uL (ref 0.0–0.5)
Eosinophils Relative: 8 %
HCT: 32.8 % — ABNORMAL LOW (ref 39.0–52.0)
Hemoglobin: 10.8 g/dL — ABNORMAL LOW (ref 13.0–17.0)
Immature Granulocytes: 1 %
Lymphocytes Relative: 15 %
Lymphs Abs: 1 10*3/uL (ref 0.7–4.0)
MCH: 30.4 pg (ref 26.0–34.0)
MCHC: 32.9 g/dL (ref 30.0–36.0)
MCV: 92.4 fL (ref 80.0–100.0)
Monocytes Absolute: 0.3 10*3/uL (ref 0.1–1.0)
Monocytes Relative: 5 %
Neutro Abs: 5 10*3/uL (ref 1.7–7.7)
Neutrophils Relative %: 71 %
Platelets: 127 10*3/uL — ABNORMAL LOW (ref 150–400)
RBC: 3.55 MIL/uL — ABNORMAL LOW (ref 4.22–5.81)
RDW: 13.7 % (ref 11.5–15.5)
WBC: 6.9 10*3/uL (ref 4.0–10.5)
nRBC: 0 % (ref 0.0–0.2)

## 2020-04-14 LAB — POCT I-STAT 7, (LYTES, BLD GAS, ICA,H+H)
Acid-Base Excess: 8 mmol/L — ABNORMAL HIGH (ref 0.0–2.0)
Bicarbonate: 31.1 mmol/L — ABNORMAL HIGH (ref 20.0–28.0)
Calcium, Ion: 1.16 mmol/L (ref 1.15–1.40)
HCT: 32 % — ABNORMAL LOW (ref 39.0–52.0)
Hemoglobin: 10.9 g/dL — ABNORMAL LOW (ref 13.0–17.0)
O2 Saturation: 89 %
Patient temperature: 37.5
Potassium: 3.4 mmol/L — ABNORMAL LOW (ref 3.5–5.1)
Sodium: 139 mmol/L (ref 135–145)
TCO2: 32 mmol/L (ref 22–32)
pCO2 arterial: 39.7 mmHg (ref 32.0–48.0)
pH, Arterial: 7.504 — ABNORMAL HIGH (ref 7.350–7.450)
pO2, Arterial: 53 mmHg — ABNORMAL LOW (ref 83.0–108.0)

## 2020-04-14 LAB — BASIC METABOLIC PANEL
Anion gap: 8 (ref 5–15)
BUN: 19 mg/dL (ref 6–20)
CO2: 30 mmol/L (ref 22–32)
Calcium: 7.8 mg/dL — ABNORMAL LOW (ref 8.9–10.3)
Chloride: 101 mmol/L (ref 98–111)
Creatinine, Ser: 0.51 mg/dL — ABNORMAL LOW (ref 0.61–1.24)
GFR, Estimated: >60 mL/min (ref >60)
Glucose, Bld: 127 mg/dL — ABNORMAL HIGH (ref 70–99)
Potassium: 3.4 mmol/L — ABNORMAL LOW (ref 3.5–5.1)
Sodium: 139 mmol/L (ref 135–145)

## 2020-04-14 LAB — GLUCOSE, CAPILLARY
Glucose-Capillary: 124 mg/dL — ABNORMAL HIGH (ref 70–99)
Glucose-Capillary: 118 mg/dL — ABNORMAL HIGH (ref 70–99)
Glucose-Capillary: 204 mg/dL — ABNORMAL HIGH (ref 70–99)
Glucose-Capillary: 179 mg/dL — ABNORMAL HIGH (ref 70–99)
Glucose-Capillary: 114 mg/dL — ABNORMAL HIGH (ref 70–99)
Glucose-Capillary: 109 mg/dL — ABNORMAL HIGH (ref 70–99)

## 2020-04-14 MED ORDER — POTASSIUM CHLORIDE 20 MEQ/15ML (10%) PO SOLN
40.0000 meq | Freq: Once | ORAL | Status: AC
  Administered 2020-04-14: 40 meq
  Filled 2020-04-14: qty 30

## 2020-04-14 MED ORDER — QUETIAPINE FUMARATE 25 MG PO TABS
125.0000 mg | ORAL_TABLET | Freq: Two times a day (BID) | ORAL | Status: DC
  Administered 2020-04-14 – 2020-04-15 (×3): 125 mg
  Filled 2020-04-14 (×3): qty 1

## 2020-04-14 NOTE — Progress Notes (Signed)
eLink Physician-Brief Progress Note Patient Name: Dennis Howe [name]: 1975/09/07 [name]: [id number]   Date of Service  04/14/2020  HPI/Events of Note  K+ 3.4  eICU Interventions  KCL 40 meq via Cortrak x 1        Migdalia Dk 04/14/2020, 5:49 AM

## 2020-04-14 NOTE — Progress Notes (Signed)
Brief Progress Note   Wife [name] updated by phone. Questions asked and answered.  Bevelyn Ngo, MSN, AGACNP-BC [facility] See Amion for personal pager PCCM on call pager [phone]

## 2020-04-14 NOTE — Progress Notes (Signed)
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  Treated with steroids and remdesivir. Received Actemra 10/3 & 10/6 .  Due to rising D-dimer lower extremity ultrasound and echo was obtained.  PCCM consulted 10/8  Past Medical History  None  Significant Hospital Events   10/2 hospital admission 10/8 subcutaneous air in neck and?  Right apical pneumothorax versus artifact 10/9 intubated due to prolonged desaturation to 70s without recovery 10/10 1 chest tube on left and 2 chest tubes placed on right due to recurrent ptx 10/11 transferred to [facility] for ECMO consideration 10/11 prone ventilation initiated 10/14 started on APRV 10/15 prone ventilation discontinued 10/19 Recurrent R pneumo despite two R-sided chest tubes, suction increased to 40 with resolution  Consults:    Procedures:  ETT 10/9 >> Right IJ CVL 10/9 >> Right chest tube x2 10/10>> Lt Chest tube 10/10>>  Significant Diagnostic Tests:  Chest x-ray 10/8 subcutaneous air in neck,?  Right apical pneumothorax 10/7 venous duplex negative BLE 10/3 echo nmlLVEF  Micro Data:  10/2 Bc >> ng 10/07 MRSA PCR >> negative 10/9 resp >> GPC, GNR -abundant Haemophilus influenzae  Antimicrobials:  cefepime10/9 -10/11 Vancomycin10/9- 10/12 Ceftriaxone 10/12-10/18, 10/21-  Interim history/subjective:  Overnight restraints needed for agitation, Remains on Fentanyl at 300 and versed at 7 Will encourage boluses of Versed./ Fentanyl  vs increase in drip rate  Can increase Seroquel to 125 BID with continuous QTc monitoriing to see if this aids in  weaning sedation Right foot cool to touch this am >> + pulse per doppler Net negative 4.7 L T max 99.1, WBC is 6.9 K of 3.4>> repleted per E link CXR personally reviewed>> Minimal residual pneumothorax>> Bilateral Pulmonary Infiltrates  Objective   Blood pressure 133/67, pulse 68, temperature 99.7 F (37.6 C), resp. rate (!) 26, height 5\' 11"  (1.803 m), weight 95.3 kg, SpO2 96 %.    Vent Mode: PRVC FiO2 (%):  [50 %-70 %] 60 % Set Rate:  [26 bmp] 26 bmp Vt Set:  [500 mL-550 mL] 550 mL PEEP:  [5 cmH20] 5 cmH20 Plateau Pressure:  [28 cmH20-30 cmH20] 28 cmH20   Intake/Output Summary (Last 24 hours) at 04/14/2020 0854 Last data filed at 04/14/2020 0700 Gross per 24 hour  Intake 2308.34 ml  Output 3975 ml  Net -1666.66 ml   Filed Weights   04/12/20 0353 04/13/20 0347 04/14/20 0432  Weight: 96.2 kg 97 kg 95.3 kg   General:  Critically ill-appearing M, mildly agitated through the night, but calm now HEENT: MM pink/moist, ETT secure and in place, Cor Trak secure and intact  Neuro:  Pupils equal, mildly agitated with stimulation , though following simple commands CV: s1s2 rrr, no m/r/g,  PULM:  Two R-sided and one L-sided chest tube, no air leak 10/22,  Improved breath sounds today with mild expiratory wheezing, rate of 30 GI: soft, bsx4 active, ND, NT  Extremities: warm/dry, 1+ edema, RLE cool to touch  Skin: no rashes or lesions Sputum Cx 10/21>> pending  CXR personally reviewed, small R apical Pneumo, persistent bilateral infiltrates  Resolved Hospital Problem list   AKI/hyperkalemia  Assessment & Plan:    Acute Hypoxic  Respiratory Failure secondary to Covid-19 and H. Flu PNA Continue mechanical ventilation per ARDS protocol, continued PEEP wean to 5 today, however de-saturating and dyssynchronous with vent with lower sedation, P/F ratio down to 85 with increased thick secretions P: -Increased po Oxy 10mg  q6h to q4hr and Klonopin 1mg  to 2mg  on 10/21 in attempt to decrease  Versed and Fentanyl and SBT without significant decrease over night  - Will increase seroquel to 325 BID and monitor QTc -Add chest PT, follow recollected sputum cx, completed course of Ceftriaxone for H. flu, resume cephalosporin pending culture results x 7 days unless cx is negative -Titrate Vent setting to maintain SpO2 greater than or equal to 85%. -HOB elevated 30 degrees. -Continue ARDSnet vent protocol 6cc/kg -Plateau pressures less than 30 cm H20. Goal driving pressures -Follow chest x-ray, ABG prn.   -Bronchial hygiene and RT/bronchodilator protocol. -Repeat ABG in the AM   Bilateral pneumothoraces, pneumomediastinum with subcutaneous emphysema recurrent right-sided pneumothorax today No air leak today with stable, small residual R apical pneumo P: -continue bilateral chest tubes with increased suction -hope to decrease PEEP and sedation with goal of extubation to reduce positive pressure   COVID-19 pneumonia Completed remdesivir & received Actemra Continue solumedrol taper 20mg  qd until 10/25   Haemophilus influenzae pneumonia Ceftriaxone completed 10/18, resume 10/21 2/2 increasing thick secretions and re-culture Plan Follow Micro Trend fever curve and WBC Treat x 7 days unless negative cultures    Hyperglycemia,  exacerbated by steroids P: -continue SSI   Best practice:  Diet: EN Pain/Anxiety/Delirium protocol (if indicated): fentanyl and versed gtt, titrate to RASS of -1 to -2 VAP protocol (if indicated): HOB 30 degrees, suction prn DVT prophylaxis: Lovenox GI prophylaxis: Protonix Glucose control: SSI while on steroids  Mobility: Bedrest  Code Status: Full Family Communication: Pt's wife updated 10/21 Disposition: ICU  CRITICAL CARE Performed by: 11/18  Total critical care time: 35 minutes  Critical care time was exclusive of separately billable procedures and treating other patients.  Critical care was necessary to treat or  prevent imminent or life-threatening deterioration.  Critical care was time spent personally by me on the following activities: development of treatment plan with patient and/or surrogate as well as nursing, discussions with consultants, evaluation of patient's response to treatment, examination of patient, obtaining history from patient or surrogate, ordering and performing treatments and interventions, ordering and review of laboratory studies, ordering and review of radiographic studies, pulse oximetry, re-evaluation of patient's condition and participation in multidisciplinary rounds.   11/21, MSN, AGACNP-BC [facility] See Amion for personal pager PCCM on call pager [phone] 04/14/2020 8:54 AM

## 2020-04-15 ENCOUNTER — Inpatient Hospital Stay (HOSPITAL_COMMUNITY): Payer: 59

## 2020-04-15 DIAGNOSIS — U071 COVID-19: Secondary | ICD-10-CM

## 2020-04-15 DIAGNOSIS — J80 Acute respiratory distress syndrome: Secondary | ICD-10-CM

## 2020-04-15 LAB — POCT I-STAT 7, (LYTES, BLD GAS, ICA,H+H)
Acid-Base Excess: 7 mmol/L — ABNORMAL HIGH (ref 0.0–2.0)
Acid-Base Excess: 7 mmol/L — ABNORMAL HIGH (ref 0.0–2.0)
Bicarbonate: 31.8 mmol/L — ABNORMAL HIGH (ref 20.0–28.0)
Bicarbonate: 33.5 mmol/L — ABNORMAL HIGH (ref 20.0–28.0)
Calcium, Ion: 1.17 mmol/L (ref 1.15–1.40)
Calcium, Ion: 1.19 mmol/L (ref 1.15–1.40)
HCT: 33 % — ABNORMAL LOW (ref 39.0–52.0)
HCT: 37 % — ABNORMAL LOW (ref 39.0–52.0)
Hemoglobin: 11.2 g/dL — ABNORMAL LOW (ref 13.0–17.0)
Hemoglobin: 12.6 g/dL — ABNORMAL LOW (ref 13.0–17.0)
O2 Saturation: 91 %
O2 Saturation: 87 %
Patient temperature: 100.6
Patient temperature: 100.2
Potassium: 3.8 mmol/L (ref 3.5–5.1)
Potassium: 4.2 mmol/L (ref 3.5–5.1)
Sodium: 137 mmol/L (ref 135–145)
Sodium: 137 mmol/L (ref 135–145)
TCO2: 33 mmol/L — ABNORMAL HIGH (ref 22–32)
TCO2: 35 mmol/L — ABNORMAL HIGH (ref 22–32)
pCO2 arterial: 48.7 mmHg — ABNORMAL HIGH (ref 32.0–48.0)
pCO2 arterial: 56.8 mmHg — ABNORMAL HIGH (ref 32.0–48.0)
pH, Arterial: 7.427 (ref 7.350–7.450)
pH, Arterial: 7.383 (ref 7.350–7.450)
pO2, Arterial: 63 mmHg — ABNORMAL LOW (ref 83.0–108.0)
pO2, Arterial: 59 mmHg — ABNORMAL LOW (ref 83.0–108.0)

## 2020-04-15 LAB — CULTURE, RESPIRATORY W GRAM STAIN: Culture: NORMAL

## 2020-04-15 LAB — GLUCOSE, CAPILLARY
Glucose-Capillary: 118 mg/dL — ABNORMAL HIGH (ref 70–99)
Glucose-Capillary: 139 mg/dL — ABNORMAL HIGH (ref 70–99)
Glucose-Capillary: 220 mg/dL — ABNORMAL HIGH (ref 70–99)
Glucose-Capillary: 178 mg/dL — ABNORMAL HIGH (ref 70–99)
Glucose-Capillary: 168 mg/dL — ABNORMAL HIGH (ref 70–99)

## 2020-04-15 MED ORDER — SODIUM CHLORIDE 0.9 % IV SOLN
0.5000 mg/h | INTRAVENOUS | Status: DC
  Administered 2020-04-15: 0.5 mg/h via INTRAVENOUS
  Administered 2020-04-16: 4 mg/h via INTRAVENOUS
  Administered 2020-04-16: 3.5 mg/h via INTRAVENOUS
  Administered 2020-04-17: 4 mg/h via INTRAVENOUS
  Administered 2020-04-17 – 2020-04-18 (×2): 3.5 mg/h via INTRAVENOUS
  Administered 2020-04-19: 2 mg/h via INTRAVENOUS
  Administered 2020-04-20: 1 mg/h via INTRAVENOUS
  Filled 2020-04-15 (×12): qty 5

## 2020-04-15 MED ORDER — HYDROMORPHONE HCL 1 MG/ML IJ SOLN
1.0000 mg | Freq: Once | INTRAMUSCULAR | Status: AC
  Administered 2020-04-15: 1 mg via INTRAVENOUS
  Filled 2020-04-15: qty 1

## 2020-04-15 MED ORDER — HYDROMORPHONE BOLUS VIA INFUSION
0.5000 mg | INTRAVENOUS | Status: DC | PRN
  Administered 2020-04-15 – 2020-04-21 (×15): 0.5 mg via INTRAVENOUS
  Filled 2020-04-15: qty 1

## 2020-04-15 NOTE — Progress Notes (Signed)
Elink RN. Have updated wife at her bequest. Clarified w/ RN that pt remains at FiO2 .50. Also broached subject of possible need for trach in future that would facilitate weaning from vent and sedation. Encouraged questions and educated. Wife verbalizes understanding.

## 2020-04-15 NOTE — Progress Notes (Addendum)
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  Treated with steroids and remdesivir. Received Actemra 10/3 & 10/6 .  Due to rising D-dimer lower extremity ultrasound and echo was obtained.  PCCM consulted 10/8  Past Medical History  None  Significant Hospital Events   10/2 hospital admission 10/8 subcutaneous air in neck and?  Right apical pneumothorax versus artifact 10/9 intubated due to prolonged desaturation to 70s without recovery 10/10 1 chest tube on left and 2 chest tubes placed on right due to recurrent ptx 10/11 transferred to [facility] for ECMO consideration 10/11 prone ventilation initiated 10/14 started on APRV 10/15 prone ventilation discontinued 10/19 Recurrent R pneumo despite two R-sided chest tubes, suction increased to 40 with resolution  Consults:    Procedures:  ETT 10/9 >> Right IJ CVL 10/9 >> Right chest tube x2 10/10>> Lt Chest tube 10/10>>  Significant Diagnostic Tests:  Chest x-ray 10/8 subcutaneous air in neck,?  Right apical pneumothorax 10/7 venous duplex negative BLE 10/3 echo nmlLVEF  Micro Data:  10/2 Bc >> ng 10/07 MRSA PCR >> negative 10/9 resp >> GPC, GNR -abundant Haemophilus influenzae  Antimicrobials:  cefepime10/9 -10/11 Vancomycin10/9- 10/12 Ceftriaxone 10/12-10/18, 10/21-  Interim history/subjective:  Overnight patient sedation was decreased to 50 of fentanyl per hour and Versed 2 mg/h, he became agitated, tachypneic, tachycardic and hypoxic.  Unfortunately sedation has to be increased to improve his hemodynamics  Objective   Blood pressure (!)  150/100, pulse 81, temperature 99.1 F (37.3 C), temperature source Oral, resp. rate (!) 33, height 5\' 11"  (1.803 m), weight 93.4 kg, SpO2 95 %.    Vent Mode: PRVC FiO2 (%):  [50 %] 50 % Set Rate:  [26 bmp] 26 bmp Vt Set:  [460 mL-520 mL] 460 mL PEEP:  [5 cmH20] 5 cmH20 Plateau Pressure:  [29 cmH20-31 cmH20] 31 cmH20   Intake/Output Summary (Last 24 hours) at 04/15/2020 1434 Last data filed at 04/15/2020 1400 Gross per 24 hour  Intake 3313.54 ml  Output 3605 ml  Net -291.46 ml   Filed Weights   04/13/20 0347 04/14/20 0432 04/15/20 0500  Weight: 97 kg 95.3 kg 93.4 kg   General:  Critically ill-appearing M, orally intubated HEENT: MM pink/moist, ETT secure and in place, Cor Trak secure and intact  Neuro:  Pupils equal, mildly agitated with stimulation , though following simple commands CV: s1s2 rrr, no m/r/g,  PULM:  Two R-sided and one L-sided chest tube, no air leak 10/22,  Improved breath sounds today with mild expiratory wheezing, rate of 30 GI: soft, bsx4 active, ND, NT  Extremities: warm/dry, 1+ edema, RLE cool to touch  Skin: no rashes or lesions   Resolved Hospital Problem list   AKI/hyperkalemia/hypokalemia  Assessment & Plan:    Acute hypoxic/hypercapnic respiratory failure due to ARDS from COVID-19 pneumonia Haemophilus influenza pneumonia Continue mechanical ventilation per ARDS protocol Currently patient is on FiO2 of 50 and PEEP of 5 He was dyssynchronous with the vent when sedation was decreased to fentanyl of 50/h and Versed of 2/h He received multiple boluses of sedating medications to make him synchronous with the vent and We will slowly try to decrease  sedation  Continue p.o. Oxy 10mg  q6h to q4hr and Klonopin 1mg  to 2mg  on 10/21 in attempt to decrease Versed and Fentanyl and SBT without significant decrease over night  Continue seroquel to 325 BID and monitor QTc Patient completed 7 days treatment for haemophilus influenza pneumonia with  ceftriaxone/cefepime but unfortunately his repeat respiratory culture is growing coccobacilli which is consistent with haemophilus influenza, he was restarted back on ceftriaxone with that he has improved respiratory secretions, he remained afebrile Patient completed treatment with remdesivir Continue Solu-Medrol He is a status post Tocilizumab   Bilateral pneumothoraces, pneumomediastinum with subcutaneous emphysema recurrent right-sided pneumothorax today No air leak today with stable, small residual R apical pneumo -continue bilateral chest tubes with increased suction Continue with minimal PEEP and sedation with goal of extubation to reduce positive pressure   Steroid-induced hyperglycemia Continue SSI   Best practice:  Diet: EN Pain/Anxiety/Delirium protocol (if indicated): fentanyl and versed gtt, titrate to RASS of -1 to -2 VAP protocol (if indicated): HOB 30 degrees, suction prn DVT prophylaxis: Lovenox GI prophylaxis: Protonix Glucose control: SSI while on steroids  Mobility: Bedrest  Code Status: Full Family Communication: Pt's wife updated Disposition: ICU  Total critical care time: 35 minutes  Performed by:   Critical care time was exclusive of separately billable procedures and treating other patients.   Critical care was necessary to treat or prevent imminent or life-threatening deterioration.   Critical care was time spent personally by me on the following activities: development of treatment plan with patient and/or surrogate as well as nursing, discussions with consultants, evaluation of patient's response to treatment, examination of patient, obtaining history from patient or surrogate, ordering and performing treatments and interventions, ordering and review of laboratory studies, ordering and review of radiographic studies, pulse oximetry and re-evaluation of patient's condition.   11/21 MD Critical care physician [facility]  Critical Care  Pager: [phone] Mobile: [phone]

## 2020-04-15 NOTE — Progress Notes (Signed)
Assisted tele visit to patient with family member.  Lisle Skillman M, RN   

## 2020-04-15 NOTE — Progress Notes (Signed)
eLink Physician-Brief Progress Note Patient Name: Audley Hinojos [name]: 08/04/1975 [name]: [id number]   Date of Service  04/15/2020  HPI/Events of Note  Camera for wob increased with cough. Discussed with RN. Re suctioning done for PIP 40. On sedation. Versed, fenta. sats 88%. Chest tube no air bubble/leaking.  Received versed push, still restless. Lung- no wheezing. Double triggering on cough.   eICU Interventions  - get CxR, ABG - fenta 50 IV, did receive oxy via tube. On dilaudid gtt also.      Intervention Category Intermediate Interventions: Respiratory distress - evaluation and management  Ranee Gosselin 04/15/2020, 9:14 PM

## 2020-04-16 LAB — GLUCOSE, CAPILLARY
Glucose-Capillary: 104 mg/dL — ABNORMAL HIGH (ref 70–99)
Glucose-Capillary: 113 mg/dL — ABNORMAL HIGH (ref 70–99)
Glucose-Capillary: 116 mg/dL — ABNORMAL HIGH (ref 70–99)
Glucose-Capillary: 117 mg/dL — ABNORMAL HIGH (ref 70–99)
Glucose-Capillary: 120 mg/dL — ABNORMAL HIGH (ref 70–99)
Glucose-Capillary: 128 mg/dL — ABNORMAL HIGH (ref 70–99)
Glucose-Capillary: 148 mg/dL — ABNORMAL HIGH (ref 70–99)

## 2020-04-16 MED ORDER — ACETAMINOPHEN 160 MG/5ML PO SOLN
500.0000 mg | Freq: Once | ORAL | Status: AC
  Administered 2020-04-17: 500 mg
  Filled 2020-04-16: qty 20.3

## 2020-04-16 MED ORDER — OXYCODONE HCL 5 MG/5ML PO SOLN
15.0000 mg | ORAL | Status: DC
  Administered 2020-04-16 – 2020-04-23 (×42): 15 mg
  Filled 2020-04-16 (×42): qty 15

## 2020-04-16 MED ORDER — GUAIFENESIN-DM 100-10 MG/5ML PO SYRP
10.0000 mL | ORAL_SOLUTION | ORAL | Status: DC | PRN
  Administered 2020-04-17 – 2020-04-18 (×2): 10 mL via ORAL
  Filled 2020-04-16 (×4): qty 10

## 2020-04-16 MED ORDER — QUETIAPINE FUMARATE 50 MG PO TABS
150.0000 mg | ORAL_TABLET | Freq: Two times a day (BID) | ORAL | Status: DC
  Administered 2020-04-16 – 2020-04-18 (×5): 150 mg
  Filled 2020-04-16 (×5): qty 1

## 2020-04-16 NOTE — Progress Notes (Signed)
Assisted tele visit to patient with chaplin.  Annaliz Aven eLink RN

## 2020-04-16 NOTE — Progress Notes (Signed)
eLink Physician-Brief Progress Note Patient Name: Dennis Howe [name]: [birth year] [name]: [id number]   Date of Service  04/16/2020  HPI/Events of Note  1) Pt has temp 100.4 (have ice packs on patient, tried to get cooling blanket, none left in hospital) but with this he has HR 120s to 130s - tylenol via tube feeding.  2) Need order to renew wrist restraints - renewed to prevent self harm and injury  3) No UO, straight cath >700 ml on days, 550 ml at 2130,  if RN straight cath at 0130 and it is high again can he go ahead and get an order to place a Foley  eICU Interventions  - 3) yes.      Intervention Category Intermediate Interventions: Other:  Ranee Gosselin 04/16/2020, 11:53 PM

## 2020-04-16 NOTE — Progress Notes (Addendum)
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  Treated with steroids and remdesivir. Received Actemra 10/3 & 10/6 .  Due to rising D-dimer lower extremity ultrasound and echo was obtained.  PCCM consulted 10/8  Past Medical History  None  Significant Hospital Events   10/2 hospital admission 10/8 subcutaneous air in neck and?  Right apical pneumothorax versus artifact 10/9 intubated due to prolonged desaturation to 70s without recovery 10/10 1 chest tube on left and 2 chest tubes placed on right due to recurrent ptx 10/11 transferred to [facility] for ECMO consideration 10/11 prone ventilation initiated 10/14 started on APRV 10/15 prone ventilation discontinued 10/19 Recurrent R pneumo despite two R-sided chest tubes, suction increased to 40 with resolution  Consults:    Procedures:  ETT 10/9 >> Right IJ CVL 10/9 >> Right chest tube x2 10/10>> Lt Chest tube 10/10>>  Significant Diagnostic Tests:  Chest x-ray 10/8 subcutaneous air in neck,?  Right apical pneumothorax 10/7 venous duplex negative BLE 10/3 echo nmlLVEF  Micro Data:  10/2 Bc >> ng 10/07 MRSA PCR >> negative 10/9 resp >> GPC, GNR -abundant Haemophilus influenzae  Antimicrobials:  cefepime10/9 -10/11 Vancomycin10/9- 10/12 Ceftriaxone 10/12-10/18, 10/21-  Interim history/subjective:  Last night patient's FiO2 requirement went up to 70% and PEEP of 8, this morning FiO2 was turned down back to 50% with O2 sat remain in 90s  Objective   Blood pressure 108/71, pulse (!) 104, temperature 98.5 F (36.9 C), temperature source Oral,  resp. rate (!) 23, height 5\' 11"  (1.803 m), weight 93.4 kg, SpO2 94 %.    Vent Mode: PRVC FiO2 (%):  [40 %-70 %] 50 % Set Rate:  [26 bmp] 26 bmp Vt Set:  [460 mL] 460 mL PEEP:  [5 cmH20-10 cmH20] 10 cmH20 Plateau Pressure:  [24 cmH20-28 cmH20] 28 cmH20   Intake/Output Summary (Last 24 hours) at 04/16/2020 1133 Last data filed at 04/16/2020 1000 Gross per 24 hour  Intake 2780.35 ml  Output 4545 ml  Net -1764.65 ml   Filed Weights   04/13/20 0347 04/14/20 0432 04/15/20 0500  Weight: 97 kg 95.3 kg 93.4 kg   General:  Critically ill-appearing M, orally intubated HEENT: MM pink/moist, ETT secure and in place, Cor Trak secure and intact  Neuro:  Pupils equal, mildly agitated with stimulation , though following simple commands CV: s1s2 rrr, no m/r/g,  PULM:  Two R-sided and one L-sided chest tube, no air leak 10/22, reduced air entry at the bases bilaterally, no wheezes GI: soft, bsx4 active, ND, NT  Extremities: warm/dry, 1+ edema, RLE cool to touch  Skin: no rashes or lesions   Resolved Hospital Problem list   AKI/hyperkalemia/hypokalemia  Assessment & Plan:    Acute hypoxic/hypercapnic respiratory failure due to ARDS from COVID-19 pneumonia Haemophilus influenza pneumonia Continue mechanical ventilation per ARDS protocol Currently patient is on FiO2 of 50 and PEEP of 8 Patient does not tolerate decrease sedation, will continue to come down on ventilatory setting before we will try to wake him up Currently on Dilaudid and Versed infusion Increase p.o. Oxy 15mg  q6h to q4hr and continue Klonopin 2mg  Continue Seroquel 150 mg twice daily Patient  completed 7 days treatment for haemophilus influenza pneumonia with ceftriaxone/cefepime but unfortunately his repeat respiratory culture is growing coccobacilli which is consistent with haemophilus influenza, he was restarted back on ceftriaxone Patient's respiratory secretions are still green and thin consistency We will repeat  respiratory culture again Patient completed treatment with remdesivir Continue Solu-Medrol He is a status post Tocilizumab   Bilateral pneumothoraces, pneumomediastinum with subcutaneous emphysema recurrent right-sided pneumothorax today No air leak today with stable, small residual R apical pneumo -continue bilateral chest tubes with increased suction Continue with minimal PEEP and sedation with goal of extubation to reduce positive pressure   Steroid-induced hyperglycemia Continue SSI   Best practice:  Diet: Tube feed Pain/Anxiety/Delirium protocol (if indicated): fentanyl and versed gtt, titrate to RASS of -1 to -2 VAP protocol (if indicated): HOB 30 degrees, suction prn DVT prophylaxis: Lovenox GI prophylaxis: Protonix Glucose control: SSI while on steroids  Mobility: Bedrest  Code Status: Full Family Communication: Pt's wife [name] updated Disposition: ICU  Total critical care time: 39 minutes  Performed by: Cheri Fowler   Critical care time was exclusive of separately billable procedures and treating other patients.   Critical care was necessary to treat or prevent imminent or life-threatening deterioration.   Critical care was time spent personally by me on the following activities: development of treatment plan with patient and/or surrogate as well as nursing, discussions with consultants, evaluation of patient's response to treatment, examination of patient, obtaining history from patient or surrogate, ordering and performing treatments and interventions, ordering and review of laboratory studies, ordering and review of radiographic studies, pulse oximetry and re-evaluation of patient's condition.   Cheri Fowler MD Critical care physician [facility] [place] Critical Care  Pager: [phone] Mobile: [phone]

## 2020-04-17 LAB — CBC
HCT: 36.9 % — ABNORMAL LOW (ref 39.0–52.0)
Hemoglobin: 11.7 g/dL — ABNORMAL LOW (ref 13.0–17.0)
MCH: 30.2 pg (ref 26.0–34.0)
MCHC: 31.7 g/dL (ref 30.0–36.0)
MCV: 95.1 fL (ref 80.0–100.0)
Platelets: 180 10*3/uL (ref 150–400)
RBC: 3.88 MIL/uL — ABNORMAL LOW (ref 4.22–5.81)
RDW: 14.5 % (ref 11.5–15.5)
WBC: 6.6 10*3/uL (ref 4.0–10.5)
nRBC: 0 % (ref 0.0–0.2)

## 2020-04-17 LAB — POCT I-STAT 7, (LYTES, BLD GAS, ICA,H+H)
Acid-Base Excess: 9 mmol/L — ABNORMAL HIGH (ref 0.0–2.0)
Bicarbonate: 35.5 mmol/L — ABNORMAL HIGH (ref 20.0–28.0)
Calcium, Ion: 1.13 mmol/L — ABNORMAL LOW (ref 1.15–1.40)
HCT: 35 % — ABNORMAL LOW (ref 39.0–52.0)
Hemoglobin: 11.9 g/dL — ABNORMAL LOW (ref 13.0–17.0)
O2 Saturation: 90 %
Patient temperature: 103.3
Potassium: 4 mmol/L (ref 3.5–5.1)
Sodium: 136 mmol/L (ref 135–145)
TCO2: 37 mmol/L — ABNORMAL HIGH (ref 22–32)
pCO2 arterial: 64.8 mmHg — ABNORMAL HIGH (ref 32.0–48.0)
pH, Arterial: 7.358 (ref 7.350–7.450)
pO2, Arterial: 73 mmHg — ABNORMAL LOW (ref 83.0–108.0)

## 2020-04-17 LAB — COMPREHENSIVE METABOLIC PANEL
ALT: 41 U/L (ref 0–44)
AST: 18 U/L (ref 15–41)
Albumin: 1.9 g/dL — ABNORMAL LOW (ref 3.5–5.0)
Alkaline Phosphatase: 92 U/L (ref 38–126)
Anion gap: 10 (ref 5–15)
BUN: 17 mg/dL (ref 6–20)
CO2: 30 mmol/L (ref 22–32)
Calcium: 7.8 mg/dL — ABNORMAL LOW (ref 8.9–10.3)
Chloride: 97 mmol/L — ABNORMAL LOW (ref 98–111)
Creatinine, Ser: 0.54 mg/dL — ABNORMAL LOW (ref 0.61–1.24)
GFR, Estimated: >60 mL/min (ref >60)
Glucose, Bld: 178 mg/dL — ABNORMAL HIGH (ref 70–99)
Potassium: 3.9 mmol/L (ref 3.5–5.1)
Sodium: 137 mmol/L (ref 135–145)
Total Bilirubin: 0.6 mg/dL (ref 0.3–1.2)
Total Protein: 5.2 g/dL — ABNORMAL LOW (ref 6.5–8.1)

## 2020-04-17 LAB — GLUCOSE, CAPILLARY
Glucose-Capillary: 122 mg/dL — ABNORMAL HIGH (ref 70–99)
Glucose-Capillary: 125 mg/dL — ABNORMAL HIGH (ref 70–99)
Glucose-Capillary: 128 mg/dL — ABNORMAL HIGH (ref 70–99)
Glucose-Capillary: 134 mg/dL — ABNORMAL HIGH (ref 70–99)
Glucose-Capillary: 150 mg/dL — ABNORMAL HIGH (ref 70–99)
Glucose-Capillary: 160 mg/dL — ABNORMAL HIGH (ref 70–99)

## 2020-04-17 LAB — D-DIMER, QUANTITATIVE: D-Dimer, Quant: 2.31 ug/mL-FEU — ABNORMAL HIGH (ref 0.00–0.50)

## 2020-04-17 LAB — MAGNESIUM: Magnesium: 2.1 mg/dL (ref 1.7–2.4)

## 2020-04-17 LAB — PHOSPHORUS: Phosphorus: 4.1 mg/dL (ref 2.5–4.6)

## 2020-04-17 MED ORDER — PIPERACILLIN-TAZOBACTAM 3.375 G IVPB
3.3750 g | Freq: Three times a day (TID) | INTRAVENOUS | Status: DC
  Administered 2020-04-17 – 2020-04-19 (×6): 3.375 g via INTRAVENOUS
  Filled 2020-04-17 (×7): qty 50

## 2020-04-17 MED ORDER — LACTATED RINGERS IV BOLUS: 1000.0000 mL | Freq: Once | INTRAVENOUS | Status: DC

## 2020-04-17 NOTE — Plan of Care (Signed)

## 2020-04-17 NOTE — Progress Notes (Signed)
Pharmacy Antibiotic Note  [name] Dennis Howe is a 44 y.o. male admitted on 03/25/2020 with suspected HCAP.  Pharmacy has been consulted for Zosyn dosing.  Plan: Zosyn 3.375g IV q8h (4 hour infusion).  Follow up renal function, culture results, clinical course.  Height: 5\' 11"  (180.3 cm) Weight: 93.5 kg (206 lb 2.1 oz) IBW/kg (Calculated) : 75.3  Temp (24hrs), Avg:100.6 F (38.1 C), Min:98.5 F (36.9 C), Max:103.3 F (39.6 C)  Recent Labs  Lab 04/11/20 0318 04/12/20 0346 04/13/20 0335 04/14/20 0412 04/17/20 0430  WBC 9.4 6.8 7.7 6.9 6.6  CREATININE 0.45* 0.56* 0.42* 0.51* 0.54*    Estimated Creatinine Clearance: 140.5 mL/min (A) (by C-G formula based on SCr of 0.54 mg/dL (L)).    No Known Allergies  Antimicrobials this admission: 10/2 Remdesivir >> 10/6 10/3 Actemra x1 10/6 Actemra x1 (second dose) 10/9 Vancomycin >> 10/12 10/9 Cefepime >> 10/12 10/12 Ceftriaxone >> 10/18 10/21 Ceftriaxone >> 10/25 10/25 Zosyn >>  Dose adjustments this admission: 10/10 Vanc 1gm q8 > 1250mg  q12 10/11 Vanc 1250mg  q12 > 1500mg  q12  Microbiology results: 10/2 Influenza A/B: neg; Covid positive 10/2 BC: NGF 10/7 MRSA PCR: negative 10/9 Trach asp: few GPC, rare GNR 10/21 Tach asp: normal resp flora 10/24 Trach asp:   Thank you for allowing pharmacy to be a part of this patient's care.  12/7 04/17/2020 7:40 AM

## 2020-04-17 NOTE — Progress Notes (Signed)
eLink Physician-Brief Progress Note Patient Name: Dennis Howe [name]: July 25, 1975 [name]: [id number]   Date of Service  04/17/2020  HPI/Events of Note  Bed side RN asking for d dimer. Fever. On Lovenox as VTE.  Looking for causes of fever.  eICU Interventions  D dimer ordered, if elevated consider Venous duplex to r/o DVT.      Intervention Category Intermediate Interventions: Other:  Ranee Gosselin 04/17/2020, 6:48 AM

## 2020-04-17 NOTE — Progress Notes (Signed)
eLink Physician-Brief Progress Note Patient Name: Dennis Howe [name]: [birth year] [name]: [id number]   Date of Service  04/17/2020  HPI/Events of Note  Agitation - Request to renew bilateral soft wrist restraints.  eICU Interventions  Will renew bilateral soft wrist restraints X 12 hours.         Crit Obremski Dennard Nip 04/17/2020, 10:29 PM

## 2020-04-17 NOTE — Progress Notes (Signed)
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  Treated with steroids and remdesivir. Received Actemra 10/3 & 10/6 .  Due to rising D-dimer lower extremity ultrasound and echo was obtained.  PCCM consulted 10/8  Past Medical History  None  Significant Hospital Events   10/2 hospital admission 10/8 subcutaneous air in neck and?  Right apical pneumothorax versus artifact 10/9 intubated due to prolonged desaturation to 70s without recovery 10/10 1 chest tube on left and 2 chest tubes placed on right due to recurrent ptx 10/11 transferred to [facility] for ECMO consideration 10/11 prone ventilation initiated 10/14 started on APRV 10/15 prone ventilation discontinued 10/19 Recurrent R pneumo despite two R-sided chest tubes, suction increased to 40 with resolution  Consults:    Procedures:  ETT 10/9 >> Right IJ CVL 10/9 >> Right chest tube x2 10/10>> Lt Chest tube 10/10>>  Significant Diagnostic Tests:  Chest x-ray 10/8 subcutaneous air in neck,?  Right apical pneumothorax 10/7 venous duplex negative BLE 10/3 echo nmlLVEF  Micro Data:  10/2 Bc >> ng 10/07 MRSA PCR >> negative 10/9 resp >> GPC, GNR -abundant Haemophilus influenzae  Antimicrobials:  cefepime10/9 -10/11 Vancomycin10/9- 10/12 Ceftriaxone 10/12-10/18, 10/21-  Interim history/subjective:  Patient is very sensitive to decreasing sedation, he gets very agitated, tachypneic, tachycardic and hypoxic.  Even though his FiO2 requirement is coming down, still he is requiring increasing sedation  Objective   Blood pressure 94/64, pulse (!)  113, temperature 99.3 F (37.4 C), temperature source Oral, resp. rate (!) 22, height 5\' 11"  (1.803 m), weight 93.5 kg, SpO2 95 %.    Vent Mode: PRVC FiO2 (%):  [40 %-50 %] 40 % Set Rate:  [26 bmp] 26 bmp Vt Set:  [460 mL] 460 mL PEEP:  [10 cmH20] 10 cmH20 Plateau Pressure:  [26 cmH20-34 cmH20] 32 cmH20   Intake/Output Summary (Last 24 hours) at 04/17/2020 1007 Last data filed at 04/17/2020 0900 Gross per 24 hour  Intake 1400.96 ml  Output 2385 ml  Net -984.04 ml   Filed Weights   04/14/20 0432 04/15/20 0500 04/17/20 0407  Weight: 95.3 kg 93.4 kg 93.5 kg   General:  Critically ill-appearing M, orally intubated HEENT: MM pink/moist, ETT secure and in place, Cor Trak secure and intact  Neuro:  Pupils equal, mildly agitated with stimulation , eyes closed, not following commands CV: s1s2 rrr, no m/r/g,  PULM:  Two R-sided and one L-sided chest tube, no air leak 10/22, reduced air entry at the bases bilaterally, no wheezes GI: soft, bsx4 active, ND, NT  Extremities: warm/dry, 1+ edema, RLE cool to touch  Skin: no rashes or lesions   Resolved Hospital Problem list   AKI/hyperkalemia/hypokalemia  Assessment & Plan:    Acute hypoxic/hypercapnic respiratory failure due to ARDS from COVID-19 pneumonia Haemophilus influenza pneumonia Continue mechanical ventilation per ARDS protocol Currently patient is on FiO2 of 40 and PEEP of 10 Patient does not tolerate decrease sedation, will continue to come down on ventilatory setting before we will try to wake him up Currently on Dilaudid and Versed infusion Continue p.o. Oxy 15mg  q6h to q4hr and continue Klonopin 2mg  Continue Seroquel 150 mg twice  daily Patient completed 7 days treatment for haemophilus influenza pneumonia with ceftriaxone/cefepime but unfortunately his repeat respiratory culture is growing coccobacilli which is consistent with haemophilus influenza, he was restarted back on ceftriaxone Patient's respiratory secretions are  still greenish, he started spiking fevers with T-max of 103 Ceftriaxone was switched back to IV Zosyn Repeat respiratory culture is growing gram-positive rods, will continue to monitor Patient completed treatment with remdesivir Continue Solu-Medrol He is a status post Tocilizumab Will start discussing tracheostomy with patient's wife   Bilateral pneumothoraces, pneumomediastinum with subcutaneous emphysema recurrent right-sided pneumothorax today No air leak today with stable, small residual R apical pneumo Continue bilateral chest tubes with increased suction Continue with minimal PEEP and sedation with goal of extubation to reduce positive pressure   Steroid-induced hyperglycemia Continue SSI   Best practice:  Diet: Tube feed Pain/Anxiety/Delirium protocol (if indicated): fentanyl and versed gtt, titrate to RASS of -1 to -2 VAP protocol (if indicated): HOB 30 degrees, suction prn DVT prophylaxis: Lovenox GI prophylaxis: Protonix Glucose control: SSI while on steroids  Mobility: Bedrest  Code Status: Full Family Communication: Pt's wife [name] updated Disposition: ICU  Total critical care time: 35 minutes  Performed by: Cheri Fowler   Critical care time was exclusive of separately billable procedures and treating other patients.   Critical care was necessary to treat or prevent imminent or life-threatening deterioration.   Critical care was time spent personally by me on the following activities: development of treatment plan with patient and/or surrogate as well as nursing, discussions with consultants, evaluation of patient's response to treatment, examination of patient, obtaining history from patient or surrogate, ordering and performing treatments and interventions, ordering and review of laboratory studies, ordering and review of radiographic studies, pulse oximetry and re-evaluation of patient's condition.   Cheri Fowler MD Critical care physician [facility] [place]  Critical Care  Pager: [phone] Mobile: [phone]

## 2020-04-17 NOTE — Progress Notes (Signed)
Assisted tele visit to patient with wife.  Dennis Preusser McEachran, RN  

## 2020-04-18 ENCOUNTER — Inpatient Hospital Stay (HOSPITAL_COMMUNITY): Payer: 59

## 2020-04-18 DIAGNOSIS — U071 COVID-19: Secondary | ICD-10-CM | POA: Diagnosis not present

## 2020-04-18 DIAGNOSIS — J80 Acute respiratory distress syndrome: Secondary | ICD-10-CM | POA: Diagnosis not present

## 2020-04-18 LAB — GLUCOSE, CAPILLARY
Glucose-Capillary: 100 mg/dL — ABNORMAL HIGH (ref 70–99)
Glucose-Capillary: 108 mg/dL — ABNORMAL HIGH (ref 70–99)
Glucose-Capillary: 123 mg/dL — ABNORMAL HIGH (ref 70–99)
Glucose-Capillary: 133 mg/dL — ABNORMAL HIGH (ref 70–99)
Glucose-Capillary: 143 mg/dL — ABNORMAL HIGH (ref 70–99)
Glucose-Capillary: 78 mg/dL (ref 70–99)

## 2020-04-18 LAB — CBC WITH DIFFERENTIAL/PLATELET
Abs Immature Granulocytes: 0.05 10*3/uL (ref 0.00–0.07)
Basophils Absolute: 0 10*3/uL (ref 0.0–0.1)
Basophils Relative: 0 %
Eosinophils Absolute: 0.4 10*3/uL (ref 0.0–0.5)
Eosinophils Relative: 6 %
HCT: 34.8 % — ABNORMAL LOW (ref 39.0–52.0)
Hemoglobin: 11 g/dL — ABNORMAL LOW (ref 13.0–17.0)
Immature Granulocytes: 1 %
Lymphocytes Relative: 13 %
Lymphs Abs: 0.7 10*3/uL (ref 0.7–4.0)
MCH: 30 pg (ref 26.0–34.0)
MCHC: 31.6 g/dL (ref 30.0–36.0)
MCV: 94.8 fL (ref 80.0–100.0)
Monocytes Absolute: 0.3 10*3/uL (ref 0.1–1.0)
Monocytes Relative: 5 %
Neutro Abs: 4.2 10*3/uL (ref 1.7–7.7)
Neutrophils Relative %: 75 %
Platelets: 182 10*3/uL (ref 150–400)
RBC: 3.67 MIL/uL — ABNORMAL LOW (ref 4.22–5.81)
RDW: 14.2 % (ref 11.5–15.5)
WBC: 5.6 10*3/uL (ref 4.0–10.5)
nRBC: 0 % (ref 0.0–0.2)

## 2020-04-18 LAB — CULTURE, RESPIRATORY W GRAM STAIN: Culture: NORMAL

## 2020-04-18 LAB — BASIC METABOLIC PANEL
Anion gap: 8 (ref 5–15)
BUN: 13 mg/dL (ref 6–20)
CO2: 32 mmol/L (ref 22–32)
Calcium: 7.5 mg/dL — ABNORMAL LOW (ref 8.9–10.3)
Chloride: 96 mmol/L — ABNORMAL LOW (ref 98–111)
Creatinine, Ser: 0.47 mg/dL — ABNORMAL LOW (ref 0.61–1.24)
GFR, Estimated: >60 mL/min (ref >60)
Glucose, Bld: 134 mg/dL — ABNORMAL HIGH (ref 70–99)
Potassium: 3.7 mmol/L (ref 3.5–5.1)
Sodium: 136 mmol/L (ref 135–145)

## 2020-04-18 MED ORDER — VECURONIUM BROMIDE 10 MG IV SOLR
10.0000 mg | Freq: Once | INTRAVENOUS | Status: AC
  Administered 2020-04-18: 10 mg via INTRAVENOUS
  Filled 2020-04-18: qty 10

## 2020-04-18 MED ORDER — QUETIAPINE FUMARATE 100 MG PO TABS
200.0000 mg | ORAL_TABLET | Freq: Two times a day (BID) | ORAL | Status: DC
  Administered 2020-04-18 – 2020-04-29 (×22): 200 mg
  Filled 2020-04-18 (×21): qty 2
  Filled 2020-04-18: qty 4

## 2020-04-18 MED ORDER — ETOMIDATE 2 MG/ML IV SOLN
20.0000 mg | Freq: Once | INTRAVENOUS | Status: AC
  Administered 2020-04-18: 20 mg via INTRAVENOUS
  Filled 2020-04-18: qty 10

## 2020-04-18 MED ORDER — FUROSEMIDE 10 MG/ML IJ SOLN
40.0000 mg | Freq: Once | INTRAMUSCULAR | Status: AC
  Administered 2020-04-18: 40 mg via INTRAVENOUS
  Filled 2020-04-18: qty 4

## 2020-04-18 MED ORDER — POTASSIUM CHLORIDE 20 MEQ/15ML (10%) PO SOLN
40.0000 meq | Freq: Once | ORAL | Status: AC
  Administered 2020-04-18: 40 meq
  Filled 2020-04-18: qty 30

## 2020-04-18 MED ORDER — FENTANYL CITRATE (PF) 100 MCG/2ML IJ SOLN
100.0000 ug | Freq: Once | INTRAMUSCULAR | Status: DC
  Filled 2020-04-18: qty 2

## 2020-04-18 NOTE — Progress Notes (Signed)
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  Treated with steroids and remdesivir. Received Actemra 10/3 & 10/6 .  Due to rising D-dimer lower extremity ultrasound and echo was obtained.  PCCM consulted 10/8  Past Medical History  None  Significant Hospital Events   10/2 hospital admission 10/8 subcutaneous air in neck and?  Right apical pneumothorax versus artifact 10/9 intubated due to prolonged desaturation to 70s without recovery 10/10 1 chest tube on left and 2 chest tubes placed on right due to recurrent ptx 10/11 transferred to [facility] for ECMO consideration 10/11 prone ventilation initiated 10/14 started on APRV 10/15 prone ventilation discontinued 10/19 Recurrent R pneumo despite two R-sided chest tubes, suction increased to 40 with resolution  Consults:    Procedures:  ETT 10/9 >> Right IJ CVL 10/9 >> Right chest tube x2 10/10>> Lt Chest tube 10/10>>  Significant Diagnostic Tests:  Chest x-ray 10/8 subcutaneous air in neck,?  Right apical pneumothorax 10/7 venous duplex negative BLE 10/3 echo nmlLVEF  Micro Data:  10/2 Bc >> ng 10/07 MRSA PCR >> negative 10/9 resp >> GPC, GNR -abundant Haemophilus influenzae 10/24 respiratory culture >> gram-positive rods Antimicrobials:  cefepime10/9 -10/11 Vancomycin10/9- 10/12 Ceftriaxone 10/12-10/18, 10/21- 10/24 10/25  >> Zosyn  Interim history/subjective:  Is been challenging to decrease sedation on patient, last night he again got agitated and became hypoxic afterwards but then he recovered after sedation was put back on.  Currently  on 40% FiO2 with PEEP of 10  Objective   Blood pressure 105/70, pulse (!) 107, temperature 99.6 F (37.6 C), temperature source Oral, resp. rate (!) 26, height 5\' 11"  (1.803 m), weight 93.4 kg, SpO2 100 %.    Vent Mode: PRVC FiO2 (%):  [50 %] 50 % Set Rate:  [26 bmp] 26 bmp Vt Set:  [460 mL] 460 mL PEEP:  [10 cmH20] 10 cmH20 Plateau Pressure:  [26 cmH20-33 cmH20] 26 cmH20   Intake/Output Summary (Last 24 hours) at 04/18/2020 0954 Last data filed at 04/18/2020 0849 Gross per 24 hour  Intake 1853.96 ml  Output 2024 ml  Net -170.04 ml   Filed Weights   04/15/20 0500 04/17/20 0407 04/18/20 0500  Weight: 93.4 kg 93.5 kg 93.4 kg   General:  Critically ill-appearing M, orally intubated HEENT: MM pink/moist, ETT secure and in place, Cor Trak secure and intact  Neuro:  Pupils equal round reactive to light, mildly agitated with stimulation , eyes closed, not following commands CV: s1s2 rrr, no m/r/g,  PULM:  Two R-sided and one L-sided chest tube, no air leak 10/22, reduced air entry at the bases bilaterally, no wheezes GI: soft, bsx4 active, ND, NT  Extremities: warm/dry, 1+ edema, RLE cool to touch  Skin: no rashes or lesions   Resolved Hospital Problem list   AKI/hyperkalemia/hypokalemia  Assessment & Plan:    Acute hypoxic/hypercapnic respiratory failure due to ARDS from COVID-19 pneumonia Haemophilus influenza pneumonia Gram-positive rods pneumonia Continue mechanical ventilation per ARDS protocol Currently patient is on FiO2 of 40 and PEEP of 10 He is planned for tracheostomy today Currently on Dilaudid and Versed infusion Continue p.o. Oxy 15mg  q6h to q4hr and continue  Klonopin 2mg  Increase Seroquel to 200 mg twice daily Patient completed 7 days treatment for haemophilus influenza pneumonia with ceftriaxone/cefepime but unfortunately his repeat respiratory culture is growing coccobacilli after starting Zosyn patient respiratory secretions have decreased in amount,  continue to complete 7 days therapy Patient completed treatment with remdesivir Continue Solu-Medrol He is a status post Tocilizumab  Bilateral pneumothoraces, pneumomediastinum with subcutaneous emphysema recurrent right-sided pneumothorax today No air leak today with stable, small residual R apical pneumo Continue bilateral chest tubes with increased suction Continue with minimal PEEP and sedation with goal of extubation to reduce positive pressure   Steroid-induced hyperglycemia Continue SSI   Best practice:  Diet: Tube feed Pain/Anxiety/Delirium protocol (if indicated): fentanyl and versed gtt, titrate to RASS of -1 to -2 VAP protocol (if indicated): HOB 30 degrees, suction prn DVT prophylaxis: Lovenox GI prophylaxis: Protonix Glucose control: SSI while on steroids  Mobility: Bedrest  Code Status: Full Family Communication: Pt's wife updated Disposition: ICU  Total critical care time: 32 minutes  Performed by: [name]   Critical care time was exclusive of separately billable procedures and treating other patients.   Critical care was necessary to treat or prevent imminent or life-threatening deterioration.   Critical care was time spent personally by me on the following activities: development of treatment plan with patient and/or surrogate as well as nursing, discussions with consultants, evaluation of patient's response to treatment, examination of patient, obtaining history from patient or surrogate, ordering and performing treatments and interventions, ordering and review of laboratory studies, ordering and review of radiographic studies, pulse oximetry and re-evaluation of patient's condition.   Cheri Fowler MD Critical care physician [facility] [place] Critical Care  Pager: [phone] Mobile: [phone]

## 2020-04-18 NOTE — Progress Notes (Signed)
eLink Physician-Brief Progress Note Patient Name: [name] [name]: 1976-03-06 [name]: [id number]   Date of Service  04/18/2020  HPI/Events of Note  Nursing request for AM lab orders.   eICU Interventions  Will order CBC with platelets and BMP at 5 AM.      Intervention Category Major Interventions: Other:  Ammarie Matsuura Dennard Nip 04/18/2020, 2:35 AM

## 2020-04-18 NOTE — Progress Notes (Signed)
Assisted tele visit to patient with family member.  Dennis Mendizabal McEachran, RN  

## 2020-04-18 NOTE — Procedures (Signed)
Diagnostic Bronchoscopy  Dennis Howe  [id number]  1976/05/05  Date:04/18/20  Time:2:31 PM   Provider Performing:Zachery Niswander C Ildefonso Keaney   Procedure: Diagnostic Bronchoscopy (80034)  Indication(s) Assist with direct visualization of tracheostomy placement  Consent Risks of the procedure as well as the alternatives and risks of each were explained to the patient and/or caregiver.  Consent for the procedure was obtained.   Anesthesia See separate tracheostomy note   Time Out Verified patient identification, verified procedure, site/side was marked, verified correct patient position, special equipment/implants available, medications/allergies/relevant history reviewed, required imaging and test results available.   Sterile Technique Usual hand hygiene, masks, gowns, and gloves were used   Procedure Description Bronchoscope advanced through endotracheal tube and into airway.  After suctioning out tracheal secretions, bronchoscope used to provide direct visualization of tracheostomy placement.   Complications/Tolerance None; patient tolerated the procedure well.   EBL None  Specimen(s) None

## 2020-04-18 NOTE — Progress Notes (Signed)
K 3.7 Electrolytes replaced per protocol 

## 2020-04-18 NOTE — Progress Notes (Signed)
Patient was noted to redevelopment of pneumothorax on R side atfer tracheostomy. 2 R side chest tube are placed and on suction, will repeat CXR in 6 hours, if its increasing will replace chest tube. Currently patient's O2 sat is in 90'son 40% FIO2 with stable VS.   Cheri Fowler MD Critical care physician [facility] [place] Critical Care  Pager: [phone] Mobile: [phone]

## 2020-04-18 NOTE — Procedures (Signed)
Percutaneous Tracheostomy Procedure Note   Dennis Howe  [id number]  11/04/1975  Date:04/18/20  Time:2:16 PM   Provider Performing:Beulah Matusek  Procedure: Percutaneous Tracheostomy with Bronchoscopic Guidance (54656)  Indication(s) Acute respiratory failure  Consent Risks of the procedure as well as the alternatives and risks of each were explained to the patient and/or caregiver.  Consent for the procedure was obtained.  Anesthesia Etomidate, Versed, Fentanyl, Vecuronium   Time Out Verified patient identification, verified procedure, site/side was marked, verified correct patient position, special equipment/implants available, medications/allergies/relevant history reviewed, required imaging and test results available.   Sterile Technique Maximal sterile technique including sterile barrier drape, hand hygiene, sterile gown, sterile gloves, mask, hair covering.    Procedure Description Appropriate anatomy identified by palpation.  Patient's neck prepped and draped in sterile fashion.  1% lidocaine with epinephrine was used to anesthetize skin overlying neck.  1.5cm incision made and blunt dissection performed until tracheal rings could be easily palpated.   Then a size 6 Shiley tracheostomy was placed under bronchoscopic visualization using usual Seldinger technique and serial dilation.   Bronchoscope confirmed placement above the carina.  Tracheostomy was sutured in place with adhesive pad to protect skin under pressure.    Patient connected to ventilator.   Complications/Tolerance None; patient tolerated the procedure well. Chest X-ray is ordered to confirm no post-procedural complication.   EBL Minimal   Specimen(s) None

## 2020-04-19 ENCOUNTER — Inpatient Hospital Stay (HOSPITAL_COMMUNITY): Payer: 59

## 2020-04-19 DIAGNOSIS — U071 COVID-19: Secondary | ICD-10-CM

## 2020-04-19 DIAGNOSIS — J80 Acute respiratory distress syndrome: Secondary | ICD-10-CM

## 2020-04-19 LAB — GLUCOSE, CAPILLARY
Glucose-Capillary: 132 mg/dL — ABNORMAL HIGH (ref 70–99)
Glucose-Capillary: 137 mg/dL — ABNORMAL HIGH (ref 70–99)
Glucose-Capillary: 149 mg/dL — ABNORMAL HIGH (ref 70–99)
Glucose-Capillary: 162 mg/dL — ABNORMAL HIGH (ref 70–99)
Glucose-Capillary: 165 mg/dL — ABNORMAL HIGH (ref 70–99)
Glucose-Capillary: 166 mg/dL — ABNORMAL HIGH (ref 70–99)

## 2020-04-19 MED ORDER — FUROSEMIDE 10 MG/ML IJ SOLN
40.0000 mg | Freq: Once | INTRAMUSCULAR | Status: AC
  Administered 2020-04-19: 40 mg via INTRAVENOUS
  Filled 2020-04-19: qty 4

## 2020-04-19 MED ORDER — SODIUM CHLORIDE 0.9 % IV SOLN
3.0000 g | Freq: Four times a day (QID) | INTRAVENOUS | Status: AC
  Administered 2020-04-19 – 2020-04-24 (×23): 3 g via INTRAVENOUS
  Filled 2020-04-19 (×24): qty 8

## 2020-04-19 MED ORDER — SULFAMETHOXAZOLE-TRIMETHOPRIM 200-40 MG/5ML PO SUSP
30.0000 mL | Freq: Two times a day (BID) | ORAL | Status: DC
  Filled 2020-04-19: qty 30

## 2020-04-19 MED ORDER — SODIUM CHLORIDE 0.9 % IV SOLN
500.0000 mg | INTRAVENOUS | Status: AC
  Administered 2020-04-19 – 2020-04-23 (×5): 500 mg via INTRAVENOUS
  Filled 2020-04-19 (×5): qty 500

## 2020-04-19 NOTE — Progress Notes (Signed)
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  Treated with steroids and remdesivir. Received Actemra 10/3 & 10/6 .  Due to rising D-dimer lower extremity ultrasound and echo was obtained.  PCCM consulted 10/8  Past Medical History  None  Significant Hospital Events   10/2 hospital admission 10/8 subcutaneous air in neck and?  Right apical pneumothorax versus artifact 10/9 intubated due to prolonged desaturation to 70s without recovery 10/10 1 chest tube on left and 2 chest tubes placed on right due to recurrent ptx 10/11 transferred to [facility] for ECMO consideration 10/11 prone ventilation initiated 10/14 started on APRV 10/15 prone ventilation discontinued 10/19 Recurrent R pneumo despite two R-sided chest tubes, suction increased to 40 with resolution  Consults:    Procedures:  ETT 10/9 >> Right IJ CVL 10/9 >> Right chest tube x2 10/10>> Lt Chest tube 10/10>>  Significant Diagnostic Tests:  Chest x-ray 10/8 subcutaneous air in neck,?  Right apical pneumothorax 10/7 venous duplex negative BLE 10/3 echo nmlLVEF  Micro Data:  10/2 Bc >> ng 10/07 MRSA PCR >> negative 10/9 resp >> GPC, GNR -abundant Haemophilus influenzae 10/24 respiratory culture >> gram-positive rods Antimicrobials:  cefepime10/9 -10/11 Vancomycin10/9- 10/12 Ceftriaxone 10/12-10/18, 10/21- 10/24 Unasyn 10/26 >> Azithromycin 10/26 >> Interim history/subjective:  Patient continues to spike fever, despite changing antibiotics from ceftriaxone to Zosyn. Yesterday tracheostomy procedure was done, postprocedure patient was  noted to have redevelopment of right-sided pneumothorax, patient has 2 chest tube placed already on right side  Objective   Blood pressure (!) 102/52, pulse 79, temperature 99.6 F (37.6 C), temperature source Oral, resp. rate (!) 27, height 5\' 11"  (1.803 m), weight 93.7 kg, SpO2 (!) 84 %.    Vent Mode: PRVC FiO2 (%):  [40 %] 40 % Set Rate:  [26 bmp] 26 bmp Vt Set:  [460 mL] 460 mL PEEP:  [8 cmH20-10 cmH20] 10 cmH20 Plateau Pressure:  [26 cmH20-36 cmH20] 36 cmH20   Intake/Output Summary (Last 24 hours) at 04/19/2020 0938 Last data filed at 04/19/2020 0857 Gross per 24 hour  Intake 1884.89 ml  Output 2630 ml  Net -745.11 ml   Filed Weights   04/17/20 0407 04/18/20 0500 04/19/20 0257  Weight: 93.5 kg 93.4 kg 93.7 kg   General:  Critically ill-appearing M, s/p trach HEENT: MM pink/moist, Cor Trak secure and intact  Neuro: Patient opens eyes with vocal stimuli, does not track or follow commands. CV: s1s2 rrr, no m/r/g,  PULM:  Two R-sided and one L-sided chest tube, no air leak 10/22, reduced air entry at the bases bilaterally, no wheezes GI: soft, bsx4 active, ND, NT  Extremities: warm/dry, 1+ edema, RLE cool to touch  Skin: no rashes or lesions   Resolved Hospital Problem list   AKI/hyperkalemia/hypokalemia  Assessment & Plan:    Acute hypoxic/hypercapnic respiratory failure due to ARDS from COVID-19 pneumonia status post trach Haemophilus influenza pneumonia Gram-positive rods pneumonia Continue mechanical ventilation per ARDS protocol Currently patient is on FiO2 of 40 and PEEP of 8 Patient had tracheostomy placed yesterday, he tolerated procedure well Titrate Dilaudid and Versed infusion with RASS goal of 0/-1 Continue p.o. Oxy 15mg  q6h to  q4hr and continue Klonopin 2mg  Continue Seroquel to 200 mg twice daily Patient completed 7 days treatment for haemophilus influenza pneumonia with ceftriaxone/cefepime but unfortunately his repeat respiratory culture is growing  coccobacilli and gram-positive rods Patient has been spiking fevers, despite changing antibiotics from ceftriaxone to Zosyn Microbiology was contacted, after reviewing slides it was noted patient is growing Corynebacterium Zosyn was switched to Unasyn and azithromycin Patient completed treatment with remdesivir Continue Solu-Medrol He is a status post Tocilizumab  Bilateral pneumothoraces, pneumomediastinum with subcutaneous emphysema recurrent right-sided pneumothorax today No air leak today with stable, small residual R apical pneumo Continue bilateral chest tubes with suction Repeat x-ray chest is pending Continue with minimal PEEP and sedation with goal of extubation to reduce positive pressure   Steroid-induced hyperglycemia Continue SSI   Best practice:  Diet: Tube feed Pain/Anxiety/Delirium protocol (if indicated): fentanyl and versed gtt, titrate to RASS of -0 to -1 VAP protocol (if indicated): HOB 30 degrees, suction prn DVT prophylaxis: Lovenox GI prophylaxis: Protonix Glucose control: SSI while on steroids  Mobility: Bedrest  Code Status: Full Family Communication: Pt's wife updated Disposition: ICU  Total critical care time: 33 minutes  Performed by: Dennis Howe   Critical care time was exclusive of separately billable procedures and treating other patients.   Critical care was necessary to treat or prevent imminent or life-threatening deterioration.   Critical care was time spent personally by me on the following activities: development of treatment plan with patient and/or surrogate as well as nursing, discussions with consultants, evaluation of patient's response to treatment, examination of patient, obtaining history from patient or surrogate, ordering and performing treatments and interventions, ordering and review of laboratory studies, ordering and review of radiographic studies, pulse oximetry and re-evaluation of patient's condition.   Dennis Fowler MD Critical care physician [facility] [place] [facility]  Pager: [phone] Mobile: [phone]

## 2020-04-20 ENCOUNTER — Inpatient Hospital Stay (HOSPITAL_COMMUNITY): Payer: 59

## 2020-04-20 LAB — URINALYSIS, ROUTINE W REFLEX MICROSCOPIC
Bacteria, UA: NONE SEEN
Bilirubin Urine: NEGATIVE
Glucose, UA: NEGATIVE mg/dL
Hgb urine dipstick: NEGATIVE
Ketones, ur: NEGATIVE mg/dL
Leukocytes,Ua: NEGATIVE
Nitrite: NEGATIVE
Protein, ur: 100 mg/dL — AB
Specific Gravity, Urine: 1.04 — ABNORMAL HIGH (ref 1.005–1.030)
pH: 6 (ref 5.0–8.0)

## 2020-04-20 LAB — MRSA PCR SCREENING: MRSA by PCR: NEGATIVE

## 2020-04-20 LAB — COMPREHENSIVE METABOLIC PANEL
ALT: 70 U/L — ABNORMAL HIGH (ref 0–44)
AST: 28 U/L (ref 15–41)
Albumin: 1.5 g/dL — ABNORMAL LOW (ref 3.5–5.0)
Alkaline Phosphatase: 120 U/L (ref 38–126)
Anion gap: 9 (ref 5–15)
BUN: 10 mg/dL (ref 6–20)
CO2: 32 mmol/L (ref 22–32)
Calcium: 7.4 mg/dL — ABNORMAL LOW (ref 8.9–10.3)
Chloride: 97 mmol/L — ABNORMAL LOW (ref 98–111)
Creatinine, Ser: 0.54 mg/dL — ABNORMAL LOW (ref 0.61–1.24)
GFR, Estimated: >60 mL/min (ref >60)
Glucose, Bld: 178 mg/dL — ABNORMAL HIGH (ref 70–99)
Potassium: 3.3 mmol/L — ABNORMAL LOW (ref 3.5–5.1)
Sodium: 138 mmol/L (ref 135–145)
Total Bilirubin: 0.3 mg/dL (ref 0.3–1.2)
Total Protein: 5.2 g/dL — ABNORMAL LOW (ref 6.5–8.1)

## 2020-04-20 LAB — GLUCOSE, CAPILLARY
Glucose-Capillary: 123 mg/dL — ABNORMAL HIGH (ref 70–99)
Glucose-Capillary: 136 mg/dL — ABNORMAL HIGH (ref 70–99)
Glucose-Capillary: 138 mg/dL — ABNORMAL HIGH (ref 70–99)
Glucose-Capillary: 142 mg/dL — ABNORMAL HIGH (ref 70–99)
Glucose-Capillary: 155 mg/dL — ABNORMAL HIGH (ref 70–99)
Glucose-Capillary: 170 mg/dL — ABNORMAL HIGH (ref 70–99)

## 2020-04-20 LAB — POCT I-STAT 7, (LYTES, BLD GAS, ICA,H+H)
Acid-Base Excess: 12 mmol/L — ABNORMAL HIGH (ref 0.0–2.0)
Bicarbonate: 37.5 mmol/L — ABNORMAL HIGH (ref 20.0–28.0)
Calcium, Ion: 1.07 mmol/L — ABNORMAL LOW (ref 1.15–1.40)
HCT: 35 % — ABNORMAL LOW (ref 39.0–52.0)
Hemoglobin: 11.9 g/dL — ABNORMAL LOW (ref 13.0–17.0)
O2 Saturation: 94 %
Patient temperature: 100.4
Potassium: 3.4 mmol/L — ABNORMAL LOW (ref 3.5–5.1)
Sodium: 139 mmol/L (ref 135–145)
TCO2: 39 mmol/L — ABNORMAL HIGH (ref 22–32)
pCO2 arterial: 57.2 mmHg — ABNORMAL HIGH (ref 32.0–48.0)
pH, Arterial: 7.429 (ref 7.350–7.450)
pO2, Arterial: 74 mmHg — ABNORMAL LOW (ref 83.0–108.0)

## 2020-04-20 LAB — CBC
HCT: 31.2 % — ABNORMAL LOW (ref 39.0–52.0)
Hemoglobin: 9.8 g/dL — ABNORMAL LOW (ref 13.0–17.0)
MCH: 29.6 pg (ref 26.0–34.0)
MCHC: 31.4 g/dL (ref 30.0–36.0)
MCV: 94.3 fL (ref 80.0–100.0)
Platelets: 243 10*3/uL (ref 150–400)
RBC: 3.31 MIL/uL — ABNORMAL LOW (ref 4.22–5.81)
RDW: 14.5 % (ref 11.5–15.5)
WBC: 5 10*3/uL (ref 4.0–10.5)
nRBC: 0 % (ref 0.0–0.2)

## 2020-04-20 LAB — PROCALCITONIN: Procalcitonin: <0.1 ng/mL

## 2020-04-20 LAB — PHOSPHORUS: Phosphorus: 2.6 mg/dL (ref 2.5–4.6)

## 2020-04-20 LAB — MAGNESIUM: Magnesium: 2.3 mg/dL (ref 1.7–2.4)

## 2020-04-20 MED ORDER — VITAL 1.5 CAL PO LIQD
1000.0000 mL | ORAL | Status: DC
  Administered 2020-04-20 – 2020-04-28 (×9): 1000 mL
  Filled 2020-04-20 (×11): qty 1000

## 2020-04-20 MED ORDER — POTASSIUM CHLORIDE 20 MEQ/15ML (10%) PO SOLN
20.0000 meq | ORAL | Status: DC
  Administered 2020-04-20: 20 meq
  Filled 2020-04-20: qty 15

## 2020-04-20 MED ORDER — POTASSIUM CHLORIDE 10 MEQ/50ML IV SOLN
10.0000 meq | INTRAVENOUS | Status: AC
  Administered 2020-04-20 (×4): 10 meq via INTRAVENOUS
  Filled 2020-04-20 (×4): qty 50

## 2020-04-20 MED ORDER — FUROSEMIDE 10 MG/ML IJ SOLN
40.0000 mg | Freq: Once | INTRAMUSCULAR | Status: AC
  Administered 2020-04-20: 40 mg via INTRAVENOUS
  Filled 2020-04-20: qty 4

## 2020-04-20 MED ORDER — POTASSIUM CHLORIDE 20 MEQ/15ML (10%) PO SOLN
20.0000 meq | ORAL | Status: AC
  Administered 2020-04-20 (×4): 20 meq
  Filled 2020-04-20 (×4): qty 15

## 2020-04-20 NOTE — Progress Notes (Signed)
Discrepancy created and resolved while pulling out 0.5 mg tablet of Clonazepam by Demetrios Loll, RN- 1mg  Clonazepam tablet hand delivered to inpatient pharmacy by this RN.

## 2020-04-20 NOTE — Progress Notes (Signed)
Nutrition Follow-up  DOCUMENTATION CODES:   Not applicable  INTERVENTION:   Tube feeding via Cortrak tube (tip gastric):   Increase Vital 1.5 to 75 ml/hr (1800 ml/day) 45 ml ProSource TF five times per day  Provides: 2900 kcal, 176 grams protein, and 1368 ml free water.    NUTRITION DIAGNOSIS:   Increased nutrient needs related to acute illness, catabolic illness ([id number] infection) as evidenced by estimated needs. Ongoing.   GOAL:   Patient will meet greater than or equal to 90% of their needs  Met with TF.   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 He has received remdesivir course and received actemra on 10/3 and 10/6. He has no known medical history.  Pt with persistent fevers, positive respiratory cultures on abx. Electrolytes being replaced.   10/2 admitted to [facility] 10/9 intubated due to worsening hypoxia  10/10 3 chest tubes placed du to recurrent PTX 10/11 tx to [facility]M for [place] consideration, pt is a candidate but deferred for now; pt started proning 10/15 Cortrak tube; tip in stomach  10/19 recurrent R pneumo despite 2 R-sided chest tubes, suction increased  10/26 s/p trach   Patient is currently intubated on ventilator support MV: 12.6 L/min Temp (24hrs), Avg:100.5 F (38.1 C), Min:98.7 F (37.1 C), Max:103.3 F (39.6 C)  Medications reviewed and include: vitamin C, nutrisource fiber BID, SSI, KCl 20 mEq every 4 hr, zinc  Dilaudid Versed  Labs reviewed:  K+ 3.4   I&O: - 3.9  L since admission UOP: 3760 ml Chest tubes: 1: 11 ml 2: 410 ml 3: 70 ml  Current TF:  Vital 1.5 @ 70 ml/hr and 45 ml ProSource TF five times per day Provides: 2720 kcal, 168 grams protein,  Diet Order:   Diet Order             Diet NPO time specified  Diet effective now                 EDUCATION NEEDS:   No education needs have been identified at this time  Skin:  Skin Assessment: Reviewed RN Assessment  Last BM:  900 ml via rectal tube  Height:   Ht Readings from Last 1 Encounters:  04/03/20 _0  (1.803 m)    Weight:   Wt Readings from Last 1 Encounters:  04/20/20 91.7 kg    Ideal Body Weight:     BMI:  Body mass index is 28.2 kg/m.  Estimated Nutritional Needs:   Kcal:  2500-2800  Protein:  160-178 grams (1.8-2 grams/kg)  Fluid:  >/= 2.8 L/day  Lockie Pares., RD, LDN, CNSC See AMiON for contact information

## 2020-04-20 NOTE — Progress Notes (Signed)
K 3.3 Electrolytes replaced per protocol 

## 2020-04-20 NOTE — Progress Notes (Signed)
Assisted tele visit to patient with wife.  Omaya Nieland McEachran, RN  

## 2020-04-20 NOTE — Progress Notes (Signed)
eLink Physician-Brief Progress Note Patient Name: Dennis Howe [name]: 1976-02-23 [name]: [id number]   Date of Service  04/20/2020  HPI/Events of Note  Fever to 103.3 F - Patient is already on Tylenol and Unasyn.  eICU Interventions  Plan: 1. Blood cultures X 2 now.  2. Tracheal aspirate culture now. 3. UA with reflex microscopic now.      Intervention Category Major Interventions: Other:  Karren Newland Dennard Nip 04/20/2020, 4:11 AM

## 2020-04-20 NOTE — Progress Notes (Signed)
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  Treated with steroids and remdesivir. Received Actemra 10/3 & 10/6 .  Due to rising D-dimer lower extremity ultrasound and echo was obtained.  PCCM consulted 10/8  Past Medical History  None  Significant Hospital Events   10/2 hospital admission 10/8 subcutaneous air in neck and?  Right apical pneumothorax versus artifact 10/9 intubated due to prolonged desaturation to 70s without recovery 10/10 1 chest tube on left and 2 chest tubes placed on right due to recurrent ptx 10/11 transferred to [facility] for ECMO consideration 10/11 prone ventilation initiated 10/14 started on APRV 10/15 prone ventilation discontinued 10/19 Recurrent R pneumo despite two R-sided chest tubes, suction increased to 40 with resolution  Consults:    Procedures:  ETT 10/9 >> Right IJ CVL 10/9 >> Right chest tube x2 10/10>> Lt Chest tube 10/10>>  Significant Diagnostic Tests:  Chest x-ray 10/8 subcutaneous air in neck,?  Right apical pneumothorax 10/7 venous duplex negative BLE 10/3 echo nmlLVEF  Micro Data:  10/2 Bc >> ng 10/07 MRSA PCR >> negative 10/9 resp >> GPC, GNR -abundant Haemophilus influenzae 10/24 respiratory culture >> gram-positive rods Antimicrobials:  cefepime10/9 -10/11 Vancomycin10/9- 10/12 Ceftriaxone 10/12-10/18, 10/21- 10/24 Unasyn 10/26 >> Azithromycin 10/26 >> Interim history/subjective:  Patient remained febrile with T-max of 103.3, despite changing antibiotics to Unasyn and Zithromax and to cover for Corynebacterium.  He did not tolerate  pressure support trial this morning, became tachypneic and tachycardic.  Objective   Blood pressure 112/74, pulse 83, temperature 98.7 F (37.1 C), temperature source Oral, resp. rate (!) 26, height 5\' 11"  (1.803 m), weight 91.7 kg, SpO2 96 %.    Vent Mode: PRVC FiO2 (%):  [40 %] 40 % Set Rate:  [24 bmp-26 bmp] 26 bmp Vt Set:  [460 mL] 460 mL PEEP:  [8 cmH20] 8 cmH20 Plateau Pressure:  [28 cmH20-34 cmH20] 33 cmH20   Intake/Output Summary (Last 24 hours) at 04/20/2020 0957 Last data filed at 04/20/2020 0900 Gross per 24 hour  Intake 3047.47 ml  Output 5171 ml  Net -2123.53 ml   Filed Weights   04/18/20 0500 04/19/20 0257 04/20/20 0500  Weight: 93.4 kg 93.7 kg 91.7 kg   General:  Critically ill-appearing M, s/p trach HEENT: MM pink/moist, CorTrak secure and intact  Neuro: Patient opens eyes with vocal stimuli, following simple commands like moving upper and lower extremities, pupils are equal bilateral reactive to light CV: s1s2 rrr, no m/r/g,  PULM:  Two R-sided and one L-sided chest tube, no air leak 10/22, reduced air entry at the bases bilaterally, no wheezes GI: soft, bsx4 active, ND, NT  Extremities: warm/dry, 1+ edema, RLE cool to touch  Skin: no rashes or lesions   Resolved Hospital Problem list   AKI/hyperkalemia/hypokalemia  Assessment & Plan:  Acute hypoxic/hypercapnic respiratory failure due to ARDS from COVID-19 pneumonia status post trach Haemophilus influenza pneumonia Corynebacterium pneumonia Continue mechanical ventilation per ARDS protocol Currently patient is on FiO2 of 40 and PEEP was dropped to 5, patient has been tolerating well, O2 sat above 90% Titrate Dilaudid and Versed infusion with RASS goal of 0/-1, currently on Dilaudid of  1 mg/h and Versed 4 mg/h, will continue to slowly titrated down Continue p.o. Oxy 15mg  q6h to q4hr and continue Klonopin 2mg  Continue Seroquel to 200 mg twice daily Patient continued to spike fever despite changing  antibiotics to Unasyn and azithromycin, will continue to monitor if he continued to remain febrile and next 24 hours, will ask infectious disease consult Continue Unasyn and azithromycin for Corynebacterium pneumonia Patient completed treatment with remdesivir Continue Solu-Medrol He is a status post Tocilizumab  Bilateral pneumothoraces, pneumomediastinum with subcutaneous emphysema recurrent right-sided pneumothorax today No air leak today with stable Continue bilateral chest tubes with suction Repeat x-ray chest showed reappearance of right apical pneumothorax  Steroid-induced hyperglycemia Continue SSI   Best practice:  Diet: Tube feed Pain/Anxiety/Delirium protocol (if indicated): fentanyl and versed gtt, titrate to RASS of -0 to -1 VAP protocol (if indicated): HOB 30 degrees, suction prn DVT prophylaxis: Lovenox GI prophylaxis: Protonix Glucose control: SSI while on steroids  Mobility: Bedrest  Code Status: Full Family Communication: Pt's wife updated Disposition: ICU  Total critical care time: 35 minutes  Performed by:   Critical care time was exclusive of separately billable procedures and treating other patients.   Critical care was necessary to treat or prevent imminent or life-threatening deterioration.   Critical care was time spent personally by me on the following activities: development of treatment plan with patient and/or surrogate as well as nursing, discussions with consultants, evaluation of patient's response to treatment, examination of patient, obtaining history from patient or surrogate, ordering and performing treatments and interventions, ordering and review of laboratory studies, ordering and review of radiographic studies, pulse oximetry and re-evaluation of patient's condition.   [name] MD Critical care physician [facility]  Pager: [phone] Mobile: [phone]

## 2020-04-20 NOTE — Progress Notes (Signed)
When removing Clonazepam 0.5mg  tab in Pyxis, a 1mg  tablet was found in the bin.  Alerted Pharmacy and unit charge RN.  , RN delivered medication to pharmacy and discrepancy was resolved in Pyxis to reflect correct count of medication.

## 2020-04-21 LAB — GLUCOSE, CAPILLARY
Glucose-Capillary: 100 mg/dL — ABNORMAL HIGH (ref 70–99)
Glucose-Capillary: 132 mg/dL — ABNORMAL HIGH (ref 70–99)
Glucose-Capillary: 170 mg/dL — ABNORMAL HIGH (ref 70–99)
Glucose-Capillary: 161 mg/dL — ABNORMAL HIGH (ref 70–99)
Glucose-Capillary: 147 mg/dL — ABNORMAL HIGH (ref 70–99)
Glucose-Capillary: 153 mg/dL — ABNORMAL HIGH (ref 70–99)

## 2020-04-21 NOTE — Progress Notes (Signed)
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  Treated with steroids and remdesivir. Received Actemra 10/3 & 10/6 .  Due to rising D-dimer lower extremity ultrasound and echo was obtained.  PCCM consulted 10/8  Past Medical History  None  Significant Hospital Events   10/2 hospital admission 10/8 subcutaneous air in neck and?  Right apical pneumothorax versus artifact 10/9 intubated due to prolonged desaturation to 70s without recovery 10/10 1 chest tube on left and 2 chest tubes placed on right due to recurrent ptx 10/11 transferred to [facility] for ECMO consideration 10/11 prone ventilation initiated 10/14 started on APRV 10/15 prone ventilation discontinued 10/19 Recurrent R pneumo despite two R-sided chest tubes, suction increased to 40 with resolution  Consults:    Procedures:  ETT 10/9 >> Right IJ CVL 10/9 >> Right chest tube x2 10/10>> Lt Chest tube 10/10>>  Significant Diagnostic Tests:  Chest x-ray 10/8 subcutaneous air in neck,?  Right apical pneumothorax 10/7 venous duplex negative BLE 10/3 echo nmlLVEF  Micro Data:  10/2 Bc >> ng 10/07 MRSA PCR >> negative 10/9 resp >> GPC, GNR -abundant Haemophilus influenzae 10/24 respiratory culture >> gram-positive rods Antimicrobials:  cefepime10/9 -10/11 Vancomycin10/9- 10/12 Ceftriaxone 10/12-10/18, 10/21- 10/24 Unasyn 10/26 >> Azithromycin 10/26 >> Interim history/subjective:  Patient's fever curve is coming down, he did spike fever with T-max of 101 last night but overall he is improving.  He was placed on pressure support trial this  morning but was not able to tolerate due to tachypnea and hypoxia 80  Objective   Blood pressure (!) 156/133, pulse 89, temperature 100.2 F (37.9 C), temperature source Oral, resp. rate (!) 30, height 5\' 11"  (1.803 m), weight 89.1 kg, SpO2 94 %.    Vent Mode: PRVC FiO2 (%):  [40 %] 40 % Set Rate:  [26 bmp] 26 bmp Vt Set:  [460 mL] 460 mL PEEP:  [5 cmH20] 5 cmH20 Plateau Pressure:  [26 cmH20-30 cmH20] 30 cmH20   Intake/Output Summary (Last 24 hours) at 04/21/2020 1036 Last data filed at 04/21/2020 0946 Gross per 24 hour  Intake 2580.54 ml  Output 5495 ml  Net -2914.46 ml   Filed Weights   04/19/20 0257 04/20/20 0500 04/21/20 0500  Weight: 93.7 kg 91.7 kg 89.1 kg   General: Chronically ill-appearing M, s/p trach HEENT: Atraumatic, normocephalic, mucous membranes are dry, CorTrak secured and intact  Neuro: Patient opens eyes with vocal stimuli, following simple commands like moving upper and lower extremities, pupils are equal bilateral reactive to light CV: s1s2 rrr, no m/r/g,  PULM: Tachypneic, Two R-sided and one L-sided chest tube, no air leak 10/22, reduced air entry at the bases bilaterally, no wheezes GI: soft, bsx4 active, ND, NT  Extremities: warm/dry, 1+ edema, RLE cool to touch  Skin: no rashes or lesions   Resolved Hospital Problem list   AKI/hyperkalemia/hypokalemia  Assessment & Plan:  Acute hypoxic/hypercapnic respiratory failure due to ARDS from COVID-19 pneumonia status post trach Haemophilus influenza pneumonia Corynebacterium pneumonia  Continue mechanical ventilation per ARDS protocol Patient is on FiO2 of 40 and PEEP of 5, was placed on pressure support trial but he did not tolerate, became  tachypneic Will stop Dilaudid infusion today and Versed infusion 3 mg/h RASS goal of 0/-1,  Continue p.o. Oxy 15mg  q6h to q4hr and continue Klonopin 2mg  Continue Seroquel to 200 mg twice daily Continue Unasyn and azithromycin for Corynebacterium pneumonia Patient  completed treatment with remdesivir and Solu-Medrol He is a status post Tocilizumab  Bilateral pneumothoraces, pneumomediastinum with subcutaneous emphysema recurrent right-sided pneumothorax today No air leak today with stable Continue bilateral chest tubes with suction, will stop suction once patient is on trach collar to prevent the development of pneumothorax X-ray chest was repeated yesterday, shows resolved right pneumothorax  Steroid-induced hyperglycemia Continue SSI   Best practice:  Diet: Tube feed Pain/Anxiety/Delirium protocol (if indicated): fentanyl and versed gtt, titrate to RASS of -0 to -1 VAP protocol (if indicated): HOB 30 degrees, suction prn DVT prophylaxis: Lovenox GI prophylaxis: Protonix Glucose control: SSI Mobility: Bedrest  Code Status: Full Family Communication: Pt's wife updated Disposition: ICU  Total critical care time: 32 minutes  Performed by:   Critical care time was exclusive of separately billable procedures and treating other patients.   Critical care was necessary to treat or prevent imminent or life-threatening deterioration.   Critical care was time spent personally by me on the following activities: development of treatment plan with patient and/or surrogate as well as nursing, discussions with consultants, evaluation of patient's response to treatment, examination of patient, obtaining history from patient or surrogate, ordering and performing treatments and interventions, ordering and review of laboratory studies, ordering and review of radiographic studies, pulse oximetry and re-evaluation of patient's condition.   [name] MD Critical care physician [facility] [facility]  Pager: [phone] Mobile: [phone]

## 2020-04-21 NOTE — Progress Notes (Signed)
Assisted tele visit to patient with Chaplain.  Vena Austria, RN

## 2020-04-22 ENCOUNTER — Inpatient Hospital Stay: Payer: Self-pay

## 2020-04-22 LAB — POCT I-STAT 7, (LYTES, BLD GAS, ICA,H+H)
Acid-Base Excess: 7 mmol/L — ABNORMAL HIGH (ref 0.0–2.0)
Bicarbonate: 32 mmol/L — ABNORMAL HIGH (ref 20.0–28.0)
Calcium, Ion: 1.14 mmol/L — ABNORMAL LOW (ref 1.15–1.40)
HCT: 31 % — ABNORMAL LOW (ref 39.0–52.0)
Hemoglobin: 10.5 g/dL — ABNORMAL LOW (ref 13.0–17.0)
O2 Saturation: 87 %
Potassium: 3.8 mmol/L (ref 3.5–5.1)
Sodium: 139 mmol/L (ref 135–145)
TCO2: 33 mmol/L — ABNORMAL HIGH (ref 22–32)
pCO2 arterial: 46.3 mmHg (ref 32.0–48.0)
pH, Arterial: 7.447 (ref 7.350–7.450)
pO2, Arterial: 51 mmHg — ABNORMAL LOW (ref 83.0–108.0)

## 2020-04-22 LAB — COMPREHENSIVE METABOLIC PANEL
ALT: 144 U/L — ABNORMAL HIGH (ref 0–44)
AST: 76 U/L — ABNORMAL HIGH (ref 15–41)
Albumin: 1.9 g/dL — ABNORMAL LOW (ref 3.5–5.0)
Alkaline Phosphatase: 154 U/L — ABNORMAL HIGH (ref 38–126)
Anion gap: 10 (ref 5–15)
BUN: 7 mg/dL (ref 6–20)
CO2: 29 mmol/L (ref 22–32)
Calcium: 7.9 mg/dL — ABNORMAL LOW (ref 8.9–10.3)
Chloride: 100 mmol/L (ref 98–111)
Creatinine, Ser: 0.48 mg/dL — ABNORMAL LOW (ref 0.61–1.24)
GFR, Estimated: >60 mL/min (ref >60)
Glucose, Bld: 124 mg/dL — ABNORMAL HIGH (ref 70–99)
Potassium: 3.9 mmol/L (ref 3.5–5.1)
Sodium: 139 mmol/L (ref 135–145)
Total Bilirubin: 0.3 mg/dL (ref 0.3–1.2)
Total Protein: 6.2 g/dL — ABNORMAL LOW (ref 6.5–8.1)

## 2020-04-22 LAB — CULTURE, RESPIRATORY W GRAM STAIN
Culture: NORMAL
Special Requests: NORMAL

## 2020-04-22 LAB — GLUCOSE, CAPILLARY
Glucose-Capillary: 116 mg/dL — ABNORMAL HIGH (ref 70–99)
Glucose-Capillary: 168 mg/dL — ABNORMAL HIGH (ref 70–99)
Glucose-Capillary: 168 mg/dL — ABNORMAL HIGH (ref 70–99)
Glucose-Capillary: 180 mg/dL — ABNORMAL HIGH (ref 70–99)
Glucose-Capillary: 101 mg/dL — ABNORMAL HIGH (ref 70–99)

## 2020-04-22 LAB — MAGNESIUM: Magnesium: 2.3 mg/dL (ref 1.7–2.4)

## 2020-04-22 LAB — PHOSPHORUS: Phosphorus: 3.4 mg/dL (ref 2.5–4.6)

## 2020-04-22 MED ORDER — MIDAZOLAM HCL 2 MG/2ML IJ SOLN
2.0000 mg | INTRAMUSCULAR | Status: DC | PRN
  Administered 2020-04-23 – 2020-04-26 (×6): 2 mg via INTRAVENOUS
  Filled 2020-04-22 (×6): qty 2

## 2020-04-22 MED ORDER — MORPHINE SULFATE (PF) 4 MG/ML IV SOLN
4.0000 mg | Freq: Four times a day (QID) | INTRAVENOUS | Status: DC | PRN
  Administered 2020-04-24: 4 mg via INTRAVENOUS
  Filled 2020-04-22: qty 1

## 2020-04-22 NOTE — Progress Notes (Signed)
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  Treated with steroids and remdesivir. Received Actemra 10/3 & 10/6 .  Due to rising D-dimer lower extremity ultrasound and echo was obtained.  PCCM consulted 10/8  Past Medical History  None  Significant Hospital Events   10/2 hospital admission 10/8 subcutaneous air in neck and?  Right apical pneumothorax versus artifact 10/9 intubated due to prolonged desaturation to 70s without recovery 10/10 1 chest tube on left and 2 chest tubes placed on right due to recurrent ptx 10/11 transferred to [facility] for ECMO consideration 10/11 prone ventilation initiated 10/14 started on APRV 10/15 prone ventilation discontinued 10/19 Recurrent R pneumo despite two R-sided chest tubes, suction increased to 40 with resolution  Consults:    Procedures:  ETT 10/9 >> Right IJ CVL 10/9 >> Right chest tube x2 10/10>> Lt Chest tube 10/10>>  Significant Diagnostic Tests:  Chest x-ray 10/8 subcutaneous air in neck,?  Right apical pneumothorax 10/7 venous duplex negative BLE 10/3 echo nmlLVEF  Micro Data:  10/2 Bc >> ng 10/07 MRSA PCR >> negative 10/9 resp >> GPC, GNR -abundant Haemophilus influenzae 10/24 respiratory culture >> gram-positive rods Antimicrobials:  cefepime10/9 -10/11 Vancomycin10/9- 10/12 Ceftriaxone 10/12-10/18, 10/21- 10/24 Unasyn 10/26 >> Azithromycin 10/26 >> Interim history/subjective:  Tolerating PSV, fiO2 down to 30%. Follows commands, mouths words.   Objective   Blood pressure 140/88, pulse 88, temperature 99.6 F (37.6 C), temperature  source Oral, resp. rate (!) 32, height 5\' 11"  (1.803 m), weight 89.1 kg, SpO2 98 %.    Vent Mode: PRVC FiO2 (%):  [30 %-40 %] 30 % Set Rate:  [26 bmp] 26 bmp Vt Set:  [460 mL] 460 mL PEEP:  [5 cmH20] 5 cmH20 Plateau Pressure:  [20 cmH20-27 cmH20] 27 cmH20   Intake/Output Summary (Last 24 hours) at 04/22/2020 0954 Last data filed at 04/22/2020 0800 Gross per 24 hour  Intake 2329.07 ml  Output 3050 ml  Net -720.93 ml   Filed Weights   04/19/20 0257 04/20/20 0500 04/21/20 0500  Weight: 93.7 kg 91.7 kg 89.1 kg   General: Chronically ill-appearing M, s/p trach HEENT: Atraumatic, normocephalic, mucous membranes are dry, CorTrak secured and intact  Neuro: Patient opens eyes with vocal stimuli, following simple commands like moving upper and lower extremities, pupils are equal bilateral reactive to light CV: s1s2 rrr, no m/r/g,  PULM: Tachypneic, Two R-sided and one L-sided chest tube, no air leak on suction 10/30 GI: soft, bsx4 active, ND, NT  Extremities: warm/dry,  Skin: no rashes or lesions   Resolved Hospital Problem list   AKI/hyperkalemia/hypokalemia  Assessment & Plan:  Acute hypoxic/hypercapnic respiratory failure due to ARDS from COVID-19 pneumonia status post trach Haemophilus influenza pneumonia Corynebacterium pneumonia  Continue mechanical ventilation per ARDS protocol PSV trials RASS goal of 0 Continue p.o. Oxy 15mg  q6h to q4hr and continue Klonopin 2mg  Continue Seroquel to 200 mg twice daily D/c midazolam drip, PRN midazolam and morphine ordered Continue Unasyn and azithromycin for Corynebacterium pneumonia x 7 days Patient completed treatment with remdesivir and Solu-Medrol He is a status post Tocilizumab  Bilateral pneumothoraces, pneumomediastinum with subcutaneous emphysema recurrent right-sided pneumothorax today No  air leak today Continue bilateral chest tubes with suction, will stop suction once patient is on trach collar to prevent the development of  pneumothorax  Steroid-induced hyperglycemia Continue SSI   Best practice:  Diet: Tube feed Pain/Anxiety/Delirium protocol (if indicated): fentanyl and versed gtt, titrate to RASS of 0 VAP protocol (if indicated): HOB 30 degrees, suction prn DVT prophylaxis: Lovenox GI prophylaxis: Protonix Glucose control: SSI Mobility: Bedrest  Code Status: Full Family Communication: Pt's wife [place] updated Disposition: ICU  CRITICAL CARE Performed by: Lesia Sago Azariyah Luhrs   Total critical care time: 30 minutes  Critical care time was exclusive of separately billable procedures and treating other patients.  Critical care was necessary to treat or prevent imminent or life-threatening deterioration.  Critical care was time spent personally by me on the following activities: development of treatment plan with patient and/or surrogate as well as nursing, discussions with consultants, evaluation of patient's response to treatment, examination of patient, obtaining history from patient or surrogate, ordering and performing treatments and interventions, ordering and review of laboratory studies, ordering and review of radiographic studies, pulse oximetry and re-evaluation of patient's condition.

## 2020-04-22 NOTE — Progress Notes (Signed)
Assisted tele visit to patient with wife.  Dennis Juhasz McEachran, RN  

## 2020-04-22 NOTE — Progress Notes (Signed)
Spoke with Sharyl Nimrod RN re PICC order.  RN to obtain consent from family for PICC placement.  Unknown when PICC will be placed, pt does have adequate access working well at this time.

## 2020-04-23 LAB — CBC
HCT: 30.7 % — ABNORMAL LOW (ref 39.0–52.0)
Hemoglobin: 9.8 g/dL — ABNORMAL LOW (ref 13.0–17.0)
MCH: 30.2 pg (ref 26.0–34.0)
MCHC: 31.9 g/dL (ref 30.0–36.0)
MCV: 94.5 fL (ref 80.0–100.0)
Platelets: 463 10*3/uL — ABNORMAL HIGH (ref 150–400)
RBC: 3.25 MIL/uL — ABNORMAL LOW (ref 4.22–5.81)
RDW: 14.6 % (ref 11.5–15.5)
WBC: 8.7 10*3/uL (ref 4.0–10.5)
nRBC: 0 % (ref 0.0–0.2)

## 2020-04-23 LAB — BASIC METABOLIC PANEL
Anion gap: 7 (ref 5–15)
BUN: 12 mg/dL (ref 6–20)
CO2: 31 mmol/L (ref 22–32)
Calcium: 8.1 mg/dL — ABNORMAL LOW (ref 8.9–10.3)
Chloride: 100 mmol/L (ref 98–111)
Creatinine, Ser: 0.42 mg/dL — ABNORMAL LOW (ref 0.61–1.24)
GFR, Estimated: >60 mL/min (ref >60)
Glucose, Bld: 160 mg/dL — ABNORMAL HIGH (ref 70–99)
Potassium: 3.7 mmol/L (ref 3.5–5.1)
Sodium: 138 mmol/L (ref 135–145)

## 2020-04-23 LAB — GLUCOSE, CAPILLARY
Glucose-Capillary: 114 mg/dL — ABNORMAL HIGH (ref 70–99)
Glucose-Capillary: 116 mg/dL — ABNORMAL HIGH (ref 70–99)
Glucose-Capillary: 131 mg/dL — ABNORMAL HIGH (ref 70–99)
Glucose-Capillary: 132 mg/dL — ABNORMAL HIGH (ref 70–99)
Glucose-Capillary: 138 mg/dL — ABNORMAL HIGH (ref 70–99)
Glucose-Capillary: 151 mg/dL — ABNORMAL HIGH (ref 70–99)
Glucose-Capillary: 151 mg/dL — ABNORMAL HIGH (ref 70–99)

## 2020-04-23 MED ORDER — SODIUM CHLORIDE 0.9 % IV SOLN
500.0000 mg | INTRAVENOUS | Status: DC
  Filled 2020-04-23: qty 500

## 2020-04-23 MED ORDER — OXYCODONE HCL 5 MG/5ML PO SOLN
10.0000 mg | ORAL | Status: DC
  Administered 2020-04-23 – 2020-04-24 (×6): 10 mg
  Filled 2020-04-23 (×7): qty 10

## 2020-04-23 MED ORDER — SODIUM CHLORIDE 0.9% FLUSH: 10.0000 mL | INTRAVENOUS | Status: DC | PRN

## 2020-04-23 MED ORDER — SODIUM CHLORIDE 0.9% FLUSH
10.0000 mL | Freq: Two times a day (BID) | INTRAVENOUS | Status: DC
  Administered 2020-04-23 – 2020-04-29 (×9): 10 mL

## 2020-04-23 MED ORDER — OXYCODONE HCL 5 MG/5ML PO SOLN: 10.0000 mg | ORAL | Status: DC

## 2020-04-23 NOTE — Progress Notes (Signed)
Ventilator changed out due to incident last night where vent found in standby and pt spo2 dropped significantly. Per RN charge nurse it was recommended that it be changed out to avoid further incidents. Ventilator will be sent to biomed for inspection.

## 2020-04-23 NOTE — Progress Notes (Signed)
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  Treated with steroids and remdesivir. Received Actemra 10/3 & 10/6 .  Due to rising D-dimer lower extremity ultrasound and echo was obtained.  PCCM consulted 10/8  Past Medical History  None  Significant Hospital Events   10/2 hospital admission 10/8 subcutaneous air in neck and?  Right apical pneumothorax versus artifact 10/9 intubated due to prolonged desaturation to 70s without recovery 10/10 1 chest tube on left and 2 chest tubes placed on right due to recurrent ptx 10/11 transferred to [facility] for ECMO consideration 10/11 prone ventilation initiated 10/14 started on APRV 10/15 prone ventilation discontinued 10/19 Recurrent R pneumo despite two R-sided chest tubes, suction increased to 40 with resolution  Consults:    Procedures:  ETT 10/9 >> Right IJ CVL 10/9 >> Right chest tube x2 10/10>> Lt Chest tube 10/10>>  Significant Diagnostic Tests:  Chest x-ray 10/8 subcutaneous air in neck,?  Right apical pneumothorax 10/7 venous duplex negative BLE 10/3 echo nmlLVEF 10/31 ventilator malfunction with significant desaturation, O2 increased, weaning. Micro Data:  10/2 Bc >> ng 10/07 MRSA PCR >> negative 10/9 resp >> GPC, GNR -abundant Haemophilus influenzae 10/24 respiratory culture >> gram-positive rods Antimicrobials:  cefepime10/9 -10/11 Vancomycin10/9- 10/12 Ceftriaxone 10/12-10/18, 10/21- 10/24 Unasyn 10/26 >> Azithromycin 10/26 >> Interim history/subjective:  PSV yesterday. Reported desaturation very low after ventilator malfunction?  Was not delivering breaths. Increased FiO2 and vent was turned on and sats recovered, fiO2 up from yesterday but weaning. Febrile overnight.  Objective   Blood pressure 101/69, pulse 93, temperature (!) 100.7 F (38.2 C), temperature source Axillary, resp. rate (!) 27, height 5\' 11"  (1.803 m), weight 85.5 kg, SpO2 100 %.    Vent Mode: PRVC FiO2 (%):  [40 %-60 %] 60 % Set Rate:  [26 bmp] 26 bmp Vt Set:  [460 mL] 460 mL PEEP:  [5 cmH20] 5 cmH20 Pressure Support:  [12 cmH20] 12 cmH20 Plateau Pressure:  [24 cmH20-29 cmH20] 29 cmH20   Intake/Output Summary (Last 24 hours) at 04/23/2020 1021 Last data filed at 04/23/2020 0950 Gross per 24 hour  Intake 850.07 ml  Output 2675 ml  Net -1824.93 ml   Filed Weights   04/20/20 0500 04/21/20 0500 04/23/20 0500  Weight: 91.7 kg 89.1 kg 85.5 kg   General: Chronically ill-appearing M, s/p trach HEENT: Atraumatic, normocephalic, mucous membranes are dry, CorTrak secured and intact  Neuro: Patient opens eyes with vocal stimuli, pupils are equal bilateral reactive to light CV: s1s2 rrr, no m/r/g,  PULM: Tachypneic, Two R-sided and one L-sided chest tube, no air leak on suction 10/31 GI: soft, bsx4 active, ND, NT  Extremities: warm/dry,  Skin: no rashes or lesions   Resolved Hospital Problem list   AKI/hyperkalemia/hypokalemia  Assessment & Plan:  Acute hypoxic/hypercapnic respiratory failure due to ARDS from COVID-19 pneumonia status post trach Haemophilus influenza pneumonia Corynebacterium pneumonia  Continue mechanical ventilation per ARDS protocol PSV trials RASS goal of 0 Continue p.o. Oxy 15mg  q6h to q4hr and continue Klonopin 2mg  Continue Seroquel to 200 mg twice daily D/c midazolam drip, PRN midazolam and morphine ordered Continue Unasyn and azithromycin for Corynebacterium pneumonia x  7 days to end 11/3 Patient completed treatment with remdesivir and Solu-Medrol He is a status post Tocilizumab  Bilateral pneumothoraces,  pneumomediastinum with subcutaneous emphysema recurrent right-sided pneumothorax today No air leak today Continue bilateral chest tubes with suction, will stop suction once patient is on trach collar to prevent the development of pneumothorax  Steroid-induced hyperglycemia Continue SSI   Best practice:  Diet: Tube feed Pain/Anxiety/Delirium protocol (if indicated): fentanyl and versed gtt, titrate to RASS of 0 VAP protocol (if indicated): HOB 30 degrees, suction prn DVT prophylaxis: Lovenox GI prophylaxis: Protonix Glucose control: SSI Mobility: Bedrest  Code Status: Full Family Communication: as able Disposition: ICU  CRITICAL CARE Performed by: Karren Burly   Total critical care time: 30 minutes  Critical care time was exclusive of separately billable procedures and treating other patients.  Critical care was necessary to treat or prevent imminent or life-threatening deterioration.  Critical care was time spent personally by me on the following activities: development of treatment plan with patient and/or surrogate as well as nursing, discussions with consultants, evaluation of patient's response to treatment, examination of patient, obtaining history from patient or surrogate, ordering and performing treatments and interventions, ordering and review of laboratory studies, ordering and review of radiographic studies, pulse oximetry and re-evaluation of patient's condition.

## 2020-04-23 NOTE — Progress Notes (Signed)
Spoke with Sharyl Nimrod RN re PICC placement.  Noted pt to be febrile overnight.  Multiple cultures have been ordered. Will proceed with PICC placement.

## 2020-04-23 NOTE — Progress Notes (Signed)
Assigned to patient w/ Elink. Pt's spouse read note r/t vent malfunction last PM and had questions. Meredith RN assigned bedside expresses she is comfortable w/ me updating wife and explaining situation. Have reached out and explained that the vent appeared to spontaneously switch to a SBT and that when the pt exhausted efforts and sats decompensated, bedside staff responded and rectified settings. Questions encouraged and answered.  Wife expresses great comfort in her virtual interaction w/ pt this morning. He was responding with distinct nods to encouragement from she and his children. Before ending virtual call, wife said "I am blowing you kisses" to which the pt distinctly puckered his lips.

## 2020-04-23 NOTE — Progress Notes (Signed)
Wasted 80 ml of Dilaudid in [employer]. Witnessed by Kirk Ruths, RN.

## 2020-04-23 NOTE — Progress Notes (Signed)
Wasted 300 ml of Versed gtt in [employer]. Witnessed by Lorin Picket, RN.

## 2020-04-23 NOTE — Progress Notes (Signed)
Peripherally Inserted Central Catheter Placement  The IV Nurse has discussed with the patient and/or persons authorized to consent for the patient, the purpose of this procedure and the potential benefits and risks involved with this procedure.  The benefits include less needle sticks, lab draws from the catheter, and the patient may be discharged home with the catheter. Risks include, but not limited to, infection, bleeding, blood clot (thrombus formation), and puncture of an artery; nerve damage and irregular heartbeat and possibility to perform a PICC exchange if needed/ordered by physician.  Alternatives to this procedure were also discussed.  Bard Power PICC patient education guide, fact sheet on infection prevention and patient information card has been provided to patient /or left at bedside.  Telephone consent obtained from wife by nurse.  PICC Placement Documentation  PICC Double Lumen 04/23/20 PICC Right Cephalic 41 cm 2 cm (Active)  Indication for Insertion or Continuance of Line Prolonged intravenous therapies 04/23/20 0909  Exposed Catheter (cm) 2 cm 04/23/20 6734  Site Assessment Clean;Dry;Intact 04/23/20 0909  Lumen #1 Status Flushed;Saline locked;Blood return noted 04/23/20 0909  Lumen #2 Status Flushed;Saline locked;Blood return noted 04/23/20 0909  Dressing Type Transparent 04/23/20 0909  Dressing Status Clean;Dry;Intact 04/23/20 0909  Antimicrobial disc in place? Yes 04/23/20 0909  Safety Lock Not Applicable 04/23/20 0909  Line Care Connections checked and tightened 04/23/20 0909  Line Adjustment (NICU/IV Team Only) No 04/23/20 0909  Dressing Intervention New dressing 04/23/20 0909  Dressing Change Due 04/30/20 04/23/20 0909       Elliot Dally 04/23/2020, 9:10 AM

## 2020-04-24 DIAGNOSIS — Z9689 Presence of other specified functional implants: Secondary | ICD-10-CM | POA: Diagnosis not present

## 2020-04-24 DIAGNOSIS — J9312 Secondary spontaneous pneumothorax: Secondary | ICD-10-CM | POA: Diagnosis not present

## 2020-04-24 DIAGNOSIS — J9601 Acute respiratory failure with hypoxia: Secondary | ICD-10-CM | POA: Diagnosis not present

## 2020-04-24 DIAGNOSIS — U071 COVID-19: Secondary | ICD-10-CM | POA: Diagnosis not present

## 2020-04-24 LAB — CBC
HCT: 32.8 % — ABNORMAL LOW (ref 39.0–52.0)
Hemoglobin: 10.1 g/dL — ABNORMAL LOW (ref 13.0–17.0)
MCH: 29.4 pg (ref 26.0–34.0)
MCHC: 30.8 g/dL (ref 30.0–36.0)
MCV: 95.3 fL (ref 80.0–100.0)
Platelets: 495 10*3/uL — ABNORMAL HIGH (ref 150–400)
RBC: 3.44 MIL/uL — ABNORMAL LOW (ref 4.22–5.81)
RDW: 14.7 % (ref 11.5–15.5)
WBC: 8.1 10*3/uL (ref 4.0–10.5)
nRBC: 0 % (ref 0.0–0.2)

## 2020-04-24 LAB — BASIC METABOLIC PANEL
Anion gap: 8 (ref 5–15)
BUN: 12 mg/dL (ref 6–20)
CO2: 31 mmol/L (ref 22–32)
Calcium: 8.1 mg/dL — ABNORMAL LOW (ref 8.9–10.3)
Chloride: 100 mmol/L (ref 98–111)
Creatinine, Ser: 0.46 mg/dL — ABNORMAL LOW (ref 0.61–1.24)
GFR, Estimated: >60 mL/min (ref >60)
Glucose, Bld: 151 mg/dL — ABNORMAL HIGH (ref 70–99)
Potassium: 3.8 mmol/L (ref 3.5–5.1)
Sodium: 139 mmol/L (ref 135–145)

## 2020-04-24 LAB — GLUCOSE, CAPILLARY
Glucose-Capillary: 120 mg/dL — ABNORMAL HIGH (ref 70–99)
Glucose-Capillary: 134 mg/dL — ABNORMAL HIGH (ref 70–99)
Glucose-Capillary: 128 mg/dL — ABNORMAL HIGH (ref 70–99)
Glucose-Capillary: 142 mg/dL — ABNORMAL HIGH (ref 70–99)
Glucose-Capillary: 135 mg/dL — ABNORMAL HIGH (ref 70–99)
Glucose-Capillary: 166 mg/dL — ABNORMAL HIGH (ref 70–99)

## 2020-04-24 MED ORDER — OXYCODONE HCL 5 MG/5ML PO SOLN
10.0000 mg | Freq: Four times a day (QID) | ORAL | Status: DC
  Administered 2020-04-24 – 2020-04-26 (×8): 10 mg
  Filled 2020-04-24 (×8): qty 10

## 2020-04-24 MED ORDER — DOXAZOSIN MESYLATE 2 MG PO TABS
2.0000 mg | ORAL_TABLET | Freq: Every day | ORAL | Status: DC
  Administered 2020-04-24 – 2020-04-26 (×3): 2 mg
  Filled 2020-04-24 (×4): qty 1

## 2020-04-24 NOTE — Progress Notes (Signed)
Patient only has one chest tube ordered, but three chest tubes are placed. Elink notified.

## 2020-04-24 NOTE — Progress Notes (Addendum)
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  Treated with steroids and remdesivir. Received Actemra 10/3 & 10/6 .  Due to rising D-dimer lower extremity ultrasound and echo was obtained.  PCCM consulted 10/8  Past Medical History  None  Significant Hospital Events   10/2 hospital admission 10/8 subcutaneous air in neck and?  Right apical pneumothorax versus artifact 10/9 intubated due to prolonged desaturation to 70s without recovery 10/10 1 chest tube on left and 2 chest tubes placed on right due to recurrent ptx 10/11 transferred to [facility] for ECMO consideration 10/11 prone ventilation initiated 10/14 started on APRV 10/15 prone ventilation discontinued 10/19 Recurrent R pneumo despite two R-sided chest tubes, suction increased to 40 with resolution 10/26 Trachedostomy placed Consults:    Procedures:  ETT 10/9 >> Right IJ CVL 10/9 >> Right chest tube x2 10/10>> Lt Chest tube 10/10>>  Significant Diagnostic Tests:  Chest x-ray 10/8 subcutaneous air in neck,?  Right apical pneumothorax 10/7 venous duplex negative BLE 10/3 echo nmlLVEF 10/31 ventilator malfunction with significant desaturation, O2 increased, weaning. Micro Data:  10/2 Bc >> ng 10/07 MRSA PCR >> negative 10/9 resp >> GPC, GNR -abundant Haemophilus influenzae 10/24 respiratory culture >> gram-positive rods Antimicrobials:  cefepime10/9 -10/11 Vancomycin10/9- 10/12 Ceftriaxone 10/12-10/18, 10/21- 10/24 Unasyn 10/26 >> Azithromycin 10/26 >> Interim history/subjective:  On PS trial this morning.  Discussed with RT at the  bedside this morning, will trial trach collar.  He denies any respiratory distress although he is objectively tachypneic with respiratory rate in the high 20s.  Objective   Blood pressure 106/72, pulse (!) 109, temperature 98.2 F (36.8 C), temperature source Axillary, resp. rate (!) 21, height 5\' 11"  (1.803 m), weight 82.4 kg, SpO2 98 %.    Vent Mode: PRVC FiO2 (%):  [40 %] 40 % Set Rate:  [26 bmp] 26 bmp Vt Set:  [460 mL] 460 mL PEEP:  [5 cmH20] 5 cmH20 Plateau Pressure:  [17 cmH20-24 cmH20] 17 cmH20   Intake/Output Summary (Last 24 hours) at 04/24/2020 0842 Last data filed at 04/24/2020 0749 Gross per 24 hour  Intake 2389.73 ml  Output 3743 ml  Net -1353.27 ml   Filed Weights   04/21/20 0500 04/23/20 0500 04/24/20 0500  Weight: 89.1 kg 85.5 kg 82.4 kg   General: Chronically ill-appearing M, s/p trach HEENT: ETT to vent, mucous membranes are dry, CorTrak secured and intact  Neuro: Patient opens eyes with vocal stimuli, pupils are equal bilateral reactive to light CV: s1s2 rrr, no m/r/g,  PULM: Tachypneic, Two R-sided and one L-sided chest tube, no air leak on suction 10/31 GI: soft, bsx4 active, ND, NT  Extremities: warm/dry,  Skin: no rashes or lesions   Resolved Hospital Problem list   AKI/hyperkalemia/hypokalemia  Assessment & Plan:  Acute hypoxic/hypercapnic respiratory failure due to ARDS from COVID-19 pneumonia status post trach Haemophilus influenza pneumonia Corynebacterium pneumonia  Continue mechanical ventilation per ARDS protocol We will try trach collar today. RASS goal of 0 Titrate down oxycodone to 10 mg every 6 hours, and continue Klonopin 2mg  Continue Seroquel to 200 mg twice daily Continue PRN midazolam and morphine ordered Continue Unasyn for Corynebacterium  pneumonia x 7 days to end 11/3.  Can stop azithromycin. Patient completed treatment with remdesivir and Solu-Medrol He is a status post Tocilizumab  Bilateral pneumothoraces,  pneumomediastinum with subcutaneous emphysema recurrent right-sided pneumothorax today No air leak today Continue bilateral chest tubes with suction, will stop suction once patient is on trach collar to prevent the development of pneumothorax  Steroid-induced hyperglycemia Continue SSI  Foley catheter in place for urinary retention We will discontinue Foley catheter, add doxazosin   Best practice:  Diet: Tube feed Pain/Anxiety/Delirium protocol (if indicated): fentanyl and versed gtt, titrate to RASS of 0 VAP protocol (if indicated): HOB 30 degrees, suction prn DVT prophylaxis: Lovenox GI prophylaxis: Protonix Glucose control: SSI Mobility: Bedrest  Code Status: Full Family Communication:  Disposition: ICU  CRITICAL CARE Performed by: Charlott Holler   Total critical care time: 32 minutes  Critical care time was exclusive of separately billable procedures and treating other patients.  Critical care was necessary to treat or prevent imminent or life-threatening deterioration.  Critical care was time spent personally by me on the following activities: development of treatment plan with patient and/or surrogate as well as nursing, discussions with consultants, evaluation of patient's response to treatment, examination of patient, obtaining history from patient or surrogate, ordering and performing treatments and interventions, ordering and review of laboratory studies, ordering and review of radiographic studies, pulse oximetry and re-evaluation of patient's condition.

## 2020-04-24 NOTE — Progress Notes (Signed)
Received order to discontinue PICC line. Noted PICC line placed yesterday with history of multiple PIV sites. Reviewed chart and discussed with MD. Order discontinued by MD.

## 2020-04-24 NOTE — Progress Notes (Signed)
eLink Physician-Brief Progress Note Patient Name: Dennis Howe [name]: Jul 19, 1975 [name]: [id number]   Date of Service  04/24/2020  HPI/Events of Note  RN requests clarification of chest tube orders.  The patient has two right-sided chest tubes that required -40 cm H2O suction to fully evacuate the PTX on that side (per CCM note).  The patient also has a single left-sided chest tube that is currently to -20 cm H2O suction.   eICU Interventions  Ordered -40 cm H2O suction for both chest tubes on right side, and -20 cm H2O suction for the single chest tube on the left.     Intervention Category Minor Interventions: Routine modifications to care plan (e.g. PRN medications for pain, fever)  Marveen Reeks Lissette Schenk 04/24/2020, 9:15 PM

## 2020-04-25 DIAGNOSIS — R509 Fever, unspecified: Secondary | ICD-10-CM

## 2020-04-25 DIAGNOSIS — U071 COVID-19: Secondary | ICD-10-CM | POA: Diagnosis not present

## 2020-04-25 DIAGNOSIS — G7281 Critical illness myopathy: Secondary | ICD-10-CM

## 2020-04-25 DIAGNOSIS — J9312 Secondary spontaneous pneumothorax: Secondary | ICD-10-CM | POA: Diagnosis not present

## 2020-04-25 DIAGNOSIS — J9601 Acute respiratory failure with hypoxia: Secondary | ICD-10-CM | POA: Diagnosis not present

## 2020-04-25 DIAGNOSIS — G9341 Metabolic encephalopathy: Secondary | ICD-10-CM

## 2020-04-25 LAB — CBC
HCT: 32.1 % — ABNORMAL LOW (ref 39.0–52.0)
Hemoglobin: 10 g/dL — ABNORMAL LOW (ref 13.0–17.0)
MCH: 29.7 pg (ref 26.0–34.0)
MCHC: 31.2 g/dL (ref 30.0–36.0)
MCV: 95.3 fL (ref 80.0–100.0)
Platelets: 561 10*3/uL — ABNORMAL HIGH (ref 150–400)
RBC: 3.37 MIL/uL — ABNORMAL LOW (ref 4.22–5.81)
RDW: 14.3 % (ref 11.5–15.5)
WBC: 8.2 10*3/uL (ref 4.0–10.5)
nRBC: 0 % (ref 0.0–0.2)

## 2020-04-25 LAB — GLUCOSE, CAPILLARY
Glucose-Capillary: 114 mg/dL — ABNORMAL HIGH (ref 70–99)
Glucose-Capillary: 156 mg/dL — ABNORMAL HIGH (ref 70–99)
Glucose-Capillary: 170 mg/dL — ABNORMAL HIGH (ref 70–99)
Glucose-Capillary: 106 mg/dL — ABNORMAL HIGH (ref 70–99)
Glucose-Capillary: 113 mg/dL — ABNORMAL HIGH (ref 70–99)
Glucose-Capillary: 124 mg/dL — ABNORMAL HIGH (ref 70–99)

## 2020-04-25 LAB — CULTURE, BLOOD (ROUTINE X 2)
Culture: NO GROWTH
Culture: NO GROWTH
Special Requests: ADEQUATE
Special Requests: ADEQUATE

## 2020-04-25 LAB — BASIC METABOLIC PANEL
Anion gap: 7 (ref 5–15)
BUN: 12 mg/dL (ref 6–20)
CO2: 33 mmol/L — ABNORMAL HIGH (ref 22–32)
Calcium: 7.9 mg/dL — ABNORMAL LOW (ref 8.9–10.3)
Chloride: 99 mmol/L (ref 98–111)
Creatinine, Ser: 0.47 mg/dL — ABNORMAL LOW (ref 0.61–1.24)
GFR, Estimated: >60 mL/min (ref >60)
Glucose, Bld: 140 mg/dL — ABNORMAL HIGH (ref 70–99)
Potassium: 4 mmol/L (ref 3.5–5.1)
Sodium: 139 mmol/L (ref 135–145)

## 2020-04-25 MED ORDER — SODIUM CHLORIDE 0.9 % IV SOLN
INTRAVENOUS | Status: DC | PRN
  Administered 2020-04-25: 1000 mL via INTRAVENOUS
  Administered 2020-04-26: 500 mL via INTRAVENOUS

## 2020-04-25 MED ORDER — MORPHINE SULFATE (PF) 4 MG/ML IV SOLN
4.0000 mg | Freq: Four times a day (QID) | INTRAVENOUS | Status: DC | PRN
  Administered 2020-04-26: 4 mg via INTRAVENOUS
  Filled 2020-04-25: qty 1

## 2020-04-25 NOTE — Progress Notes (Signed)
Nutrition Follow-up  DOCUMENTATION CODES:   Not applicable  INTERVENTION:   Continue tube feeding via Cortrak tube (tip gastric): - Vital 1.5 @ 75 ml/hr (1800 ml/day) - ProSource TF 45 ml five times daily  Tube feeding regimen provides 2900 kcal, 176 grams of protein, and 1368 ml of H2O.   NUTRITION DIAGNOSIS:   Increased nutrient needs related to acute illness, catabolic illness (JOINO-67 infection) as evidenced by estimated needs.  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 He has received remdesivir course and received actemra on 10/3 and 10/6. He has no known medical history.  10/02 - admitted to [facility] 10/09 - intubated due to worsening hypoxia  10/10 - 3 chest tubes placed due to recurrent pneumothorax 10/11 - transfer to [facility] for [place] consideration, pt is a candidate but deferred for now; pt started proning 10/15 - Cortrak tube, tip in stomach  10/19 - recurrent R pneumo despite 2 R-sided chest tubes, suction increased  10/26 - s/p trach 11/01 - trach collar  Discussed pt with RN and during ICU rounds.  Cortrak remains in place with TF infusing. Tolerating without difficulty. Will continue with current TF regimen.  Admit weight: 99.1 kg Current weight: 83 kg  Weight down 20 lbs since last RD visit. Question accuracy of recent weights given large drop. RD will continue to monitor trends and adjust TF regimen as needed.  Current TF: Vital 1.5 @ 75 ml/hr, ProSource TF 45 ml 5 times daily  Medications reviewed and include: vitamin C 500 mg daily, nutrisource fiber BID, SSI q 4 hours, protonix, zinc sulfate 220 mg daily  Labs  reviewed: hemoglobin 10.0 CBG's: 114-166 x 24 hours  UOP: 1778 ml x 24 hours Rectal tube: 120 ml x 24 hours Chest tube 1: 4 ml x 24 hours Chest tube 2: 0 ml x 24 hours Chest tube 3: 30 ml x 24 hours I/O's: -16.1 L since admit  Diet Order:   Diet Order            Diet NPO time specified  Diet effective now                 EDUCATION NEEDS:   No education needs have been identified at this time  Skin:  Skin Assessment: Reviewed RN Assessment  Last BM:  04/25/20 rectal tube  Height:   Ht Readings from Last 1 Encounters:  04/03/20 $RemoveB'5\' 11"'[id number]$  (1.803 m)    Weight:   Wt Readings from Last 1 Encounters:  04/25/20 83 kg    BMI:  Body mass index is 25.52 kg/m.  Estimated Nutritional Needs:   Kcal:  2500-2800  Protein:  160-178 grams (1.8-2 grams/kg)  Fluid:  >/= 2.8 L/day    Gaynell Face, MS, RD, LDN Inpatient Clinical Dietitian Please see AMiON for contact information.

## 2020-04-25 NOTE — Progress Notes (Signed)
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  Treated with steroids and remdesivir. Received Actemra 10/3 & 10/6 .  Due to rising D-dimer lower extremity ultrasound and echo was obtained.  PCCM consulted 10/8  Past Medical History  None  Significant Hospital Events   10/2 hospital admission 10/8 subcutaneous air in neck and?  Right apical pneumothorax versus artifact 10/9 intubated due to prolonged desaturation to 70s without recovery 10/10 1 chest tube on left and 2 chest tubes placed on right due to recurrent ptx 10/11 transferred to [facility] for ECMO consideration 10/11 prone ventilation initiated 10/14 started on APRV 10/15 prone ventilation discontinued 10/19 Recurrent R pneumo despite two R-sided chest tubes, suction increased to 40 with resolution 10/26 Trachedostomy placed Consults:    Procedures:  ETT 10/9 >> Right IJ CVL 10/9 >> Right chest tube x2 10/10>> Lt Chest tube 10/10>>  Significant Diagnostic Tests:  Chest x-ray 10/8 subcutaneous air in neck,?  Right apical pneumothorax 10/7 venous duplex negative BLE 10/3 echo nmlLVEF 10/31 ventilator malfunction with significant desaturation, O2 increased, weaning. Micro Data:  10/2 Bc >> ng 10/07 MRSA PCR >> negative 10/9 resp >> GPC, GNR -abundant Haemophilus influenzae 10/24 respiratory culture >> gram-positive rods Antimicrobials:  cefepime10/9 -10/11 Vancomycin10/9- 10/12 Ceftriaxone 10/12-10/18, 10/21- 10/24 Unasyn 10/26 >> Azithromycin 10/26 >> Interim history/subjective:  On PS trial this morning.  Remains CPAP 8/5 RR 24   T Max 101 WBC 8.2, HGB 10, platelets 561 ( Up trending) Creatinine 0.47   Objective   Blood pressure 128/70, pulse (!) 102, temperature (!) 101.8 F (38.8 C), temperature source Oral, resp. rate (!) 23, height 5\' 11"  (1.803 m), weight 83 kg, SpO2 92 %.    Vent Mode: PSV;CPAP FiO2 (%):  [40 %] 40 % Set Rate:  [26 bmp] 26 bmp Vt Set:  [460 mL] 460 mL PEEP:  [5 cmH20] 5 cmH20 Pressure Support:  [8 cmH20] 8 cmH20   Intake/Output Summary (Last 24 hours) at 04/25/2020 0956 Last data filed at 04/25/2020 0900 Gross per 24 hour  Intake 2014.29 ml  Output 1652 ml  Net 362.29 ml   Filed Weights   04/23/20 0500 04/24/20 0500 04/25/20 0400  Weight: 85.5 kg 82.4 kg 83 kg   General: Chronically ill-appearing M, s/p trach, on PS  HEENT: ETT to secure and intact,  mucous membranes are dry, CorTrak secured and intact  Neuro: Patient opens eyes with vocal stimuli, pupils are equal bilateral reactive to light CV: s1s2 rrr, no m/r/g,  PULM: Bilateral chest excursion,  Two R-sided and one L-sided chest tube, to suction no air leak to suction 10/31 GI: soft, bsx4 active, ND, NT  Extremities: warm/dry, No obvious deformities Skin: no rashes or lesions, warm dry and intact   Resolved Hospital Problem list   AKI/hyperkalemia/hypokalemia  Assessment & Plan:  Acute hypoxic/hypercapnic respiratory failure due to ARDS from COVID-19 pneumonia status post trach Haemophilus influenza pneumonia Corynebacterium pneumonia  Continue mechanical ventilation per ARDS protocol PS and TC trials  RASS goal of 0 Titrate down oxycodone to 10 mg every 6 hours, and continue Klonopin 2mg  Continue Seroquel to 200 mg twice daily Continue  slow  wean  Of above Continue PRN midazolam and morphine ordered Continue Unasyn for Corynebacterium pneumonia x 7 days to end 11/3.   Can stop azithromycin. Patient completed treatment with remdesivir and Solu-Medrol He is a status post Tocilizumab  Bilateral pneumothoraces,  pneumomediastinum with subcutaneous emphysema recurrent right-sided pneumothorax today No air leak today Continue bilateral chest tubes with suction, will stop suction once patient is on trach collar to prevent the development of pneumothorax  Steroid-induced hyperglycemia Continue SSI Continue CBG's  Foley catheter in place for urinary retention Foley discontinued, now condom cath, add doxazosin   Best practice:  Diet: Tube feed Pain/Anxiety/Delirium protocol (if indicated): fentanyl and versed gtt, titrate to RASS of 0 VAP protocol (if indicated): HOB 30 degrees, suction prn DVT prophylaxis: Lovenox GI prophylaxis: Protonix Glucose control: SSI Mobility: Bedrest  Code Status: Full Family Communication:  Disposition: ICU : Wife updated  By phone 11/2  CRITICAL CARE Performed by: Bevelyn Ngo   Total critical care time: 33 minutes  Critical care time was exclusive of separately billable procedures and treating other patients.  Critical care was necessary to treat or prevent imminent or life-threatening deterioration.  Critical care was time spent personally by me on the following activities: development of treatment plan with patient and/or surrogate as well as nursing, discussions with consultants, evaluation of patient's response to treatment, examination of patient, obtaining history from patient or surrogate, ordering and performing treatments and interventions, ordering and review of laboratory studies, ordering and review of radiographic studies, pulse oximetry and re-evaluation of patient's condition.   Bevelyn Ngo, MSN, AGACNP-BC [facility] See Amion for personal pager PCCM on call pager [phone] 04/25/2020 10:20 AM

## 2020-04-25 NOTE — Evaluation (Signed)
Physical Therapy Evaluation Patient Details Name: Dennis Howe [name]: [id number] [name]: 03-09-1976 Today's Date: 04/25/2020   History of Present Illness  Pt is a 44 y/o spanish speaking male presenting to the [facility] ED on 10/2 with chest pain and SOB. Pt was found to have ARDS secondary to having COVID-19. 10/8 pt with subQ air and PTx, Pt was intubated on 10/9, had 2 RT and 1 LT chest tube placed on 10/10, transferred to [facility] 10/11, and had a tracheostomy placed on 10/26. Pt has no significant PMH.  Clinical Impression  Pt lethargic unable to respond to commands or open eyes with only response withdrawal and grimace to noxious stimuli. Pt received seroquel and klonopin and suspect medication related lethargy as nursing reports pt was following commands and moving extremities earlier in the day. PROM performed bil UE and LLE with PROM within functional limits. Pt noted to have dysconjugate gaze when eyelids opened and RN reports not noticed earlier today. Pt with decreased strength, function, transfers and mobility who will benefit from acute therapy to maximize function and independence to decrease burden of care.   PRVC 40% HR 118    Follow Up Recommendations LTACH;Supervision/Assistance - 24 hour    Equipment Recommendations  Other (comment) (TBD)    Recommendations for Other Services OT consult     Precautions / Restrictions Precautions Precautions: Fall Precaution Comments: vent, trach, cortrak, 3 chest tubes, flexi seal      Mobility  Bed Mobility               General bed mobility comments: unable to assess due to lethargy    Transfers                    Ambulation/Gait                Stairs            Wheelchair Mobility    Modified Rankin (Stroke Patients Only)       Balance                                             Pertinent Vitals/Pain Pain Assessment: Faces Pain Location: noxious stimuli  grimace Pain Descriptors / Indicators: Grimacing    Home Living Family/patient expects to be discharged to:: Private residence Living Arrangements: Spouse/significant other Available Help at Discharge: Family;Available 24 hours/day Type of Home: House Home Access: Level entry     Home Layout: Two level Home Equipment: None      Prior Function Level of Independence: Independent         Comments: works in [employer]. 41 yo daughter     Hand Dominance        Extremity/Trunk Assessment   Upper Extremity Assessment Upper Extremity Assessment: Difficult to assess due to impaired cognition (PROM WFL, active withdrawal to nail bed pressure only)    Lower Extremity Assessment Lower Extremity Assessment: Difficult to assess due to impaired cognition (PROM WFL, withdrawal to noxious stimuli only)       Communication   Communication: Tracheostomy;Other (comment) (primary language is spanish but speaks [place])  Cognition Arousal/Alertness: Suspect due to medications;Lethargic  General Comments: was able to respond with a grimace to noxious stimulus of sternal rub and nail bed pressure no other active response. Pt received Klonopin and seroquel      General Comments      Exercises General Exercises - Upper Extremity Shoulder Flexion: PROM;Both;Supine;10 reps Shoulder ABduction: PROM;Both;Supine;10 reps Elbow Flexion: PROM;Both;Supine;10 reps Elbow Extension: PROM;Both;Supine;10 reps   Assessment/Plan    PT Assessment Patient needs continued PT services  PT Problem List Decreased strength;Decreased mobility;Decreased coordination;Decreased cognition;Decreased balance;Decreased knowledge of use of DME;Cardiopulmonary status limiting activity       PT Treatment Interventions Gait training;DME instruction;Therapeutic exercise;Balance training;Functional mobility training;Therapeutic activities;Patient/family  education;Neuromuscular re-education    PT Goals (Current goals can be found in the Care Plan section)  Acute Rehab PT Goals Patient Stated Goal: walk and return home PT Goal Formulation: With family Time For Goal Achievement: 05/09/20 Potential to Achieve Goals: Fair    Frequency Min 3X/week   Barriers to discharge        Co-evaluation               AM-PAC PT "6 Clicks" Mobility  Outcome Measure Help needed turning from your back to your side while in a flat bed without using bedrails?: Total Help needed moving from lying on your back to sitting on the side of a flat bed without using bedrails?: Total Help needed moving to and from a bed to a chair (including a wheelchair)?: Total Help needed standing up from a chair using your arms (e.g., wheelchair or bedside chair)?: Total Help needed to walk in hospital room?: Total Help needed climbing 3-5 steps with a railing? : Total 6 Click Score: 6    End of Session   Activity Tolerance: Patient limited by lethargy Patient left: in bed Nurse Communication: Need for lift equipment PT Visit Diagnosis: Other abnormalities of gait and mobility (R26.89);Difficulty in walking, not elsewhere classified (R26.2);Muscle weakness (generalized) (M62.81);Other symptoms and signs involving the nervous system (R29.898)    Time: 7829-5621 PT Time Calculation (min) (ACUTE ONLY): 14 min   Charges:   PT Evaluation $PT Eval High Complexity: 1 High          Caelynn Marshman P, PT Acute Rehabilitation Services Pager: [phone] Office: [phone]   Samra Pesch B Qunisha Bryk 04/25/2020, 1:35 PM

## 2020-04-26 ENCOUNTER — Inpatient Hospital Stay (HOSPITAL_COMMUNITY): Payer: 59

## 2020-04-26 DIAGNOSIS — J80 Acute respiratory distress syndrome: Secondary | ICD-10-CM | POA: Diagnosis not present

## 2020-04-26 DIAGNOSIS — U071 COVID-19: Secondary | ICD-10-CM | POA: Diagnosis not present

## 2020-04-26 DIAGNOSIS — J9601 Acute respiratory failure with hypoxia: Secondary | ICD-10-CM | POA: Diagnosis not present

## 2020-04-26 DIAGNOSIS — J9312 Secondary spontaneous pneumothorax: Secondary | ICD-10-CM | POA: Diagnosis not present

## 2020-04-26 LAB — CBC
HCT: 32 % — ABNORMAL LOW (ref 39.0–52.0)
Hemoglobin: 9.8 g/dL — ABNORMAL LOW (ref 13.0–17.0)
MCH: 29.1 pg (ref 26.0–34.0)
MCHC: 30.6 g/dL (ref 30.0–36.0)
MCV: 95 fL (ref 80.0–100.0)
Platelets: 567 10*3/uL — ABNORMAL HIGH (ref 150–400)
RBC: 3.37 MIL/uL — ABNORMAL LOW (ref 4.22–5.81)
RDW: 14.2 % (ref 11.5–15.5)
WBC: 7.7 10*3/uL (ref 4.0–10.5)
nRBC: 0 % (ref 0.0–0.2)

## 2020-04-26 LAB — MAGNESIUM: Magnesium: 2.3 mg/dL (ref 1.7–2.4)

## 2020-04-26 LAB — COMPREHENSIVE METABOLIC PANEL
ALT: 175 U/L — ABNORMAL HIGH (ref 0–44)
AST: 69 U/L — ABNORMAL HIGH (ref 15–41)
Albumin: 1.9 g/dL — ABNORMAL LOW (ref 3.5–5.0)
Alkaline Phosphatase: 150 U/L — ABNORMAL HIGH (ref 38–126)
Anion gap: 8 (ref 5–15)
BUN: 14 mg/dL (ref 6–20)
CO2: 33 mmol/L — ABNORMAL HIGH (ref 22–32)
Calcium: 8.1 mg/dL — ABNORMAL LOW (ref 8.9–10.3)
Chloride: 96 mmol/L — ABNORMAL LOW (ref 98–111)
Creatinine, Ser: 0.53 mg/dL — ABNORMAL LOW (ref 0.61–1.24)
GFR, Estimated: >60 mL/min (ref >60)
Glucose, Bld: 144 mg/dL — ABNORMAL HIGH (ref 70–99)
Potassium: 4.2 mmol/L (ref 3.5–5.1)
Sodium: 137 mmol/L (ref 135–145)
Total Bilirubin: 0.4 mg/dL (ref 0.3–1.2)
Total Protein: 6.3 g/dL — ABNORMAL LOW (ref 6.5–8.1)

## 2020-04-26 LAB — GLUCOSE, CAPILLARY
Glucose-Capillary: 144 mg/dL — ABNORMAL HIGH (ref 70–99)
Glucose-Capillary: 135 mg/dL — ABNORMAL HIGH (ref 70–99)
Glucose-Capillary: 182 mg/dL — ABNORMAL HIGH (ref 70–99)
Glucose-Capillary: 180 mg/dL — ABNORMAL HIGH (ref 70–99)
Glucose-Capillary: 142 mg/dL — ABNORMAL HIGH (ref 70–99)
Glucose-Capillary: 129 mg/dL — ABNORMAL HIGH (ref 70–99)

## 2020-04-26 LAB — AMMONIA: Ammonia: 39 umol/L — ABNORMAL HIGH (ref 9–35)

## 2020-04-26 MED ORDER — CLONAZEPAM 0.5 MG PO TBDP
1.0000 mg | ORAL_TABLET | Freq: Three times a day (TID) | ORAL | Status: DC
  Administered 2020-04-26 – 2020-04-29 (×10): 1 mg
  Filled 2020-04-26 (×10): qty 2

## 2020-04-26 MED ORDER — OXYCODONE HCL 5 MG/5ML PO SOLN
10.0000 mg | Freq: Three times a day (TID) | ORAL | Status: AC
  Administered 2020-04-26 – 2020-04-28 (×8): 10 mg
  Filled 2020-04-26 (×8): qty 10

## 2020-04-26 MED ORDER — CEFDINIR 250 MG/5ML PO SUSR
300.0000 mg | Freq: Two times a day (BID) | ORAL | Status: DC
  Administered 2020-04-26 – 2020-04-29 (×7): 300 mg
  Filled 2020-04-26 (×8): qty 6

## 2020-04-26 MED ORDER — GUAIFENESIN-DM 100-10 MG/5ML PO SYRP
10.0000 mL | ORAL_SOLUTION | ORAL | Status: DC | PRN
  Administered 2020-04-27: 10 mL
  Filled 2020-04-26 (×2): qty 10

## 2020-04-26 NOTE — Progress Notes (Signed)
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  Treated with steroids and remdesivir. Received Actemra 10/3 & 10/6 .  Due to rising D-dimer lower extremity ultrasound and echo was obtained.  PCCM consulted 10/8  Past Medical History  None  Significant Hospital Events   10/2 hospital admission 10/8 subcutaneous air in neck and?  Right apical pneumothorax versus artifact 10/9 intubated due to prolonged desaturation to 70s without recovery 10/10 1 chest tube on left and 2 chest tubes placed on right due to recurrent ptx 10/11 transferred to [facility] for ECMO consideration 10/11 prone ventilation initiated 10/14 started on APRV 10/15 prone ventilation discontinued 10/19 Recurrent R pneumo despite two R-sided chest tubes, suction increased to 40 with resolution 10/26 Trachedostomy placed Consults:    Procedures:  ETT 10/9 >> Right IJ CVL 10/9 >> Right chest tube x2 10/10>> Lt Chest tube 10/10>>  Significant Diagnostic Tests:  Chest x-ray 10/8 subcutaneous air in neck,?  Right apical pneumothorax 10/7 venous duplex negative BLE 10/3 echo nmlLVEF 10/31 ventilator malfunction with significant desaturation, O2 increased, weaning. Micro Data:  10/2 Bc >> ng 10/07 MRSA PCR >> negative 10/9 resp >> GPC, GNR -abundant Haemophilus influenzae 10/24 respiratory culture >> gram-positive rods Antimicrobials:  cefepime10/9 -10/11 Vancomycin10/9- 10/12 Ceftriaxone 10/12-10/18, 10/21- 10/24 Unasyn 10/26 >> Azithromycin 10/26 >> Interim history/subjective:  On PS trial this morning.  Has only done brief  stretches on TC, nothing substantail. Appears tachypnic. Denies pain or any needs. Had visit from his daughter yesterday.  Objective   Blood pressure 138/71, pulse (!) 118, temperature 99 F (37.2 C), temperature source Axillary, resp. rate (!) 23, height 5\' 11"  (1.803 m), weight 83.1 kg, SpO2 95 %.    Vent Mode: PRVC FiO2 (%):  [40 %-50 %] 50 % Set Rate:  [26 bmp] 26 bmp Vt Set:  [460 mL] 460 mL PEEP:  [5 cmH20] 5 cmH20 Pressure Support:  [5 cmH20] 5 cmH20   Intake/Output Summary (Last 24 hours) at 04/26/2020 1629 Last data filed at 04/26/2020 1500 Gross per 24 hour  Intake 2174.34 ml  Output 825 ml  Net 1349.34 ml   Filed Weights   04/24/20 0500 04/25/20 0400 04/26/20 0500  Weight: 82.4 kg 83 kg 83.1 kg   General: Chronically ill-appearing M, s/p trach HEENT: ETT to vent, mucous membranes are dry, CorTrak secured and intact  Neuro: Patient opens eyes with vocal stimuli, pupils are equal bilateral reactive to light CV: s1s2 rrr, no m/r/g,  PULM: Tachypneic, Two R-sided and one L-sided chest tube, no air leak on suction GI: soft, bsx4 active, ND, NT  Extremities: warm/dry,  Skin: no rashes or lesions   Resolved Hospital Problem list   AKI/hyperkalemia/hypokalemia  Assessment & Plan:  Acute hypoxic/hypercapnic respiratory failure due to ARDS from COVID-19 pneumonia status post trach  Continue mechanical ventilation per ARDS protocol Patient completed treatment with remdesivir Tocilizumab and Solu-Medrol PS trials as tolerated RASS goal of 0 Titrate down oxycodone to 10 mg every 8 hours, and tapered Klonopin to 1mg  TID Continue Seroquel to 200 mg twice daily Continue PRN midazolam and morphine ordered Continue Unasyn for Corynebacterium pneumonia x 7 days to end  11/3.    Bilateral pneumothoraces, pneumomediastinum with subcutaneous emphysema recurrent right-sided pneumothorax today No air leak today Continue bilateral chest tubes with suction, will stop suction once  patient is on trach collar to prevent the development of pneumothorax  Steroid-induced hyperglycemia Continue SSI  Fevers - respiratory cultures unremarkable. Will reculture blood if re-fevers. I took down his dressings today and examine chest tubes. Mild erythema and warmth noted at the insertion sites.  - will treat for SSTI with cefdinir  Best practice:  Diet: Tube feed Pain/Anxiety/Delirium protocol (if indicated): as above VAP protocol (if indicated): HOB 30 degrees, suction prn DVT prophylaxis: Lovenox GI prophylaxis: Protonix Glucose control: SSI Mobility: Bedrest  Code Status: Full Family Communication: updated wife veronica over the phone this afternoon for several minutes regarding next steps of care including LTACH placement. Disposition: ICU  CRITICAL CARE Performed by: Charlott Holler   Total critical care time: 40 minutes  Critical care time was exclusive of separately billable procedures and treating other patients.  Critical care was necessary to treat or prevent imminent or life-threatening deterioration.  Critical care was time spent personally by me on the following activities: development of treatment plan with patient and/or surrogate as well as nursing, discussions with consultants, evaluation of patient's response to treatment, examination of patient, obtaining history from patient or surrogate, ordering and performing treatments and interventions, ordering and review of laboratory studies, ordering and review of radiographic studies, pulse oximetry and re-evaluation of patient's condition.

## 2020-04-26 NOTE — Progress Notes (Signed)
Patient was placed back in full support due to increased work of breathing.  HR 136 RR 38

## 2020-04-27 ENCOUNTER — Inpatient Hospital Stay (HOSPITAL_COMMUNITY): Payer: 59

## 2020-04-27 DIAGNOSIS — J9312 Secondary spontaneous pneumothorax: Secondary | ICD-10-CM | POA: Diagnosis not present

## 2020-04-27 DIAGNOSIS — J9601 Acute respiratory failure with hypoxia: Secondary | ICD-10-CM | POA: Diagnosis not present

## 2020-04-27 DIAGNOSIS — U071 COVID-19: Secondary | ICD-10-CM | POA: Diagnosis not present

## 2020-04-27 DIAGNOSIS — J80 Acute respiratory distress syndrome: Secondary | ICD-10-CM | POA: Diagnosis not present

## 2020-04-27 LAB — CBC
HCT: 31.2 % — ABNORMAL LOW (ref 39.0–52.0)
Hemoglobin: 9.8 g/dL — ABNORMAL LOW (ref 13.0–17.0)
MCH: 30.1 pg (ref 26.0–34.0)
MCHC: 31.4 g/dL (ref 30.0–36.0)
MCV: 95.7 fL (ref 80.0–100.0)
Platelets: 569 10*3/uL — ABNORMAL HIGH (ref 150–400)
RBC: 3.26 MIL/uL — ABNORMAL LOW (ref 4.22–5.81)
RDW: 13.9 % (ref 11.5–15.5)
WBC: 9.5 10*3/uL (ref 4.0–10.5)
nRBC: 0 % (ref 0.0–0.2)

## 2020-04-27 LAB — BASIC METABOLIC PANEL
Anion gap: 8 (ref 5–15)
BUN: 14 mg/dL (ref 6–20)
CO2: 35 mmol/L — ABNORMAL HIGH (ref 22–32)
Calcium: 8.2 mg/dL — ABNORMAL LOW (ref 8.9–10.3)
Chloride: 96 mmol/L — ABNORMAL LOW (ref 98–111)
Creatinine, Ser: 0.43 mg/dL — ABNORMAL LOW (ref 0.61–1.24)
GFR, Estimated: >60 mL/min (ref >60)
Glucose, Bld: 140 mg/dL — ABNORMAL HIGH (ref 70–99)
Potassium: 4.1 mmol/L (ref 3.5–5.1)
Sodium: 139 mmol/L (ref 135–145)

## 2020-04-27 LAB — GLUCOSE, CAPILLARY
Glucose-Capillary: 126 mg/dL — ABNORMAL HIGH (ref 70–99)
Glucose-Capillary: 138 mg/dL — ABNORMAL HIGH (ref 70–99)
Glucose-Capillary: 138 mg/dL — ABNORMAL HIGH (ref 70–99)
Glucose-Capillary: 146 mg/dL — ABNORMAL HIGH (ref 70–99)
Glucose-Capillary: 148 mg/dL — ABNORMAL HIGH (ref 70–99)
Glucose-Capillary: 178 mg/dL — ABNORMAL HIGH (ref 70–99)

## 2020-04-27 LAB — CULTURE, RESPIRATORY W GRAM STAIN
Culture: NORMAL
Gram Stain: NONE SEEN

## 2020-04-27 MED ORDER — METOPROLOL TARTRATE 25 MG/10 ML ORAL SUSPENSION
5.0000 mg | ORAL | Status: DC | PRN
  Administered 2020-04-27 – 2020-04-28 (×2): 5 mg
  Filled 2020-04-27 (×2): qty 10

## 2020-04-27 NOTE — Progress Notes (Addendum)
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  Treated with steroids and remdesivir. Received Actemra 10/3 & 10/6 .  Due to rising D-dimer lower extremity ultrasound and echo was obtained.  PCCM consulted 10/8  Past Medical History  None  Significant Hospital Events   10/2 hospital admission 10/8 subcutaneous air in neck and?  Right apical pneumothorax versus artifact 10/9 intubated due to prolonged desaturation to 70s without recovery 10/10 1 chest tube on left and 2 chest tubes placed on right due to recurrent ptx 10/11 transferred to [facility] for ECMO consideration 10/11 prone ventilation initiated 10/14 started on APRV 10/15 prone ventilation discontinued 10/19 Recurrent R pneumo despite two R-sided chest tubes, suction increased to 40 with resolution 10/26 Trachedostomy placed Consults:    Procedures:  ETT 10/9 >> Right IJ CVL 10/9 >> Right chest tube x2 10/10>> Lt Chest tube 10/10>>  Significant Diagnostic Tests:  Chest x-ray 10/8 subcutaneous air in neck,?  Right apical pneumothorax 10/7 venous duplex negative BLE 10/3 echo nmlLVEF 10/31 ventilator malfunction with significant desaturation, O2 increased, weaning. Micro Data:  10/2 Bc >> ng 10/07 MRSA PCR >> negative 10/9 resp >> GPC, GNR -abundant Haemophilus influenzae 10/24 respiratory culture >> gram-positive rods Antimicrobials:  cefepime10/9 -10/11 Vancomycin10/9- 10/12 Ceftriaxone 10/12-10/18, 10/21- 10/24 Unasyn 10/26 >> Azithromycin 10/26 >> Interim history/subjective:  2.5 hours of PS trial this morning until stopped for  HR elevation and RR.  No complaints of pain  Objective   Blood pressure 110/72, pulse (!) 120, temperature (!) 100.6 F (38.1 C), temperature source Oral, resp. rate (!) 30, height 5\' 11"  (1.803 m), weight 83 kg, SpO2 94 %.    Vent Mode: PRVC FiO2 (%):  [40 %] 40 % Set Rate:  [26 bmp] 26 bmp Vt Set:  [460 mL] 460 mL PEEP:  [5 cmH20] 5 cmH20 Pressure Support:  [10 cmH20] 10 cmH20 Plateau Pressure:  [19 cmH20-29 cmH20] 19 cmH20   Intake/Output Summary (Last 24 hours) at 04/27/2020 1110 Last data filed at 04/27/2020 1000 Gross per 24 hour  Intake 2005.73 ml  Output 2055 ml  Net -49.27 ml   Filed Weights   04/25/20 0400 04/26/20 0500 04/27/20 0500  Weight: 83 kg 83.1 kg 83 kg   General: Chronically ill-appearing M, s/p trach HEENT: ETT to vent, mucous membranes are dry, CorTrak secured and intact  Neuro: Patient opens eyes with vocal stimuli, pupils are equal bilateral reactive to light CV: s1s2 rrr, no m/r/g,  PULM: Tachypneic, Two R-sided and one L-sided chest tube, no air leak on suction GI: soft, bsx4 active, ND, NT  Extremities: warm/dry,  Skin: stage 1 Deep tissue inury noted on sacrum   Resolved Hospital Problem list   AKI/hyperkalemia/hypokalemia Corynebacterium Pneumonia  Assessment & Plan:  Acute hypoxic/hypercapnic respiratory failure due to ARDS from COVID-19 pneumonia status post trach  Continue mechanical ventilation per ARDS protocol Patient completed treatment with remdesivir Tocilizumab and Solu-Medrol PS trials as tolerated RASS goal of 0 Titrate down oxycodone to 10 mg every 8 hours, and tapered Klonopin to 1mg  TID. Would keep tapering these as tolerated Continue Seroquel to 200 mg twice daily Will d/c versed and morphine  as these do not appear to be addressing his tachycardia and he denies additional pain or anxiety.  Bilateral pneumothoraces, pneumomediastinum with subcutaneous emphysema recurrent right-sided pneumothorax today No air leak  today Continue bilateral chest tubes with suction, will stop suction once patient is on trach collar to prevent the development of pneumothorax  Fevers - respiratory cultures unremarkable.Mild erythema and warmth noted at the insertion sites. will treat for SSTI with cefdinir x 5 days total - will perform upper and lower extremity dopplers to r/o DVT or thrombophlebitis  Sinus Tachycardia - no evidence of assocation with hypoxemia, hypotension, or fevers. May be secondary to critical illness or residual post-covid state. Will start BB as needed for HR<110.  Best practice:  Diet: Tube feed Pain/Anxiety/Delirium protocol (if indicated): as above VAP protocol (if indicated): HOB 30 degrees, suction prn DVT prophylaxis: Lovenox GI prophylaxis: Protonix Glucose control: SSI Mobility: Bedrest  Code Status: Full Family Communication: wife updated over phone and at bedside 11/4.  Disposition: ICU. Patient has LTACH benefits and is waiting for medical stability - most likely his fevers are related to his superficial thrombophlebitis. Will discontinue picc line.would ask case management to reach out to wife for update on disposition.  CRITICAL CARE Performed by: Charlott Holler   Total critical care time: 35 minutes  Critical care time was exclusive of separately billable procedures and treating other patients.  Critical care was necessary to treat or prevent imminent or life-threatening deterioration.  Critical care was time spent personally by me on the following activities: development of treatment plan with patient and/or surrogate as well as nursing, discussions with consultants, evaluation of patient's response to treatment, examination of patient, obtaining history from patient or surrogate, ordering and performing treatments and interventions, ordering and review of laboratory studies, ordering and review of radiographic studies, pulse oximetry and re-evaluation of patient's  condition.

## 2020-04-27 NOTE — Progress Notes (Signed)
Pt placed on PSV 10/5 per wean protocol and is tolerating well at this time. RN aware.

## 2020-04-27 NOTE — Progress Notes (Signed)
RT called to pts room to place pt back on full vent support. HR in the 120's and pt having increased WOB. Pt tolerated 2.5 hrs of PSV 10/5.

## 2020-04-27 NOTE — TOC Progression Note (Signed)
Transition of Care [facility]) - Progression Note    Patient Details  Name: Dennis Howe [name]: [id number] Date of Birth: 1975/09/12  [facility] CM/SW Contact  Lockie Pares, RN Phone Number: 04/27/2020, 12:48 PM  Clinical Narration Patient back on ventaltor support, trach 10/24 growing gm negative rods in tracheal aspirate febrile, 100.4 started on another ABX. Not ready for LTAH yet due to febrility. Will continue to follow for readiness.    Expected Discharge Plan: Home w Home Health Services Barriers to Discharge: Barriers Unresolved (comment)  Expected Discharge Plan and Services Expected Discharge Plan: Home w Home Health Services   Discharge Planning Services: CM Consult   Living arrangements for the past 2 months: Single Family Home                                       Social Determinants of Health (SDOH) Interventions    Readmission Risk Interventions No flowsheet data found.

## 2020-04-27 NOTE — Progress Notes (Signed)
VASCULAR LAB    Bilateral lower extremity venous duplex has been performed.  See CV proc for preliminary results.   Malerie Eakins, RVT 04/27/2020, 4:26 PM

## 2020-04-27 NOTE — Progress Notes (Signed)
Physical Therapy Treatment Patient Details Name: Dennis Howe [name]: [id number] [name]: 11-02-1975 Today's Date: 04/27/2020    History of Present Illness Pt is a 44 y/o spanish speaking male presenting to the [facility] ED on 10/2 with chest pain and SOB. Pt was found to have ARDS secondary to having COVID-19. 10/8 pt with subQ air and PTx, Pt was intubated on 10/9, had 2 RT and 1 LT chest tube placed on 10/10, transferred to [facility] 10/11, and had a tracheostomy placed on 10/26. Pt has no significant PMH.    PT Comments    Pt awake, participating and following commands with increased time. Pt able to move all extremities actively today and progress to sitting EOB with assist. Pt with difficulty maintaining eyes open even with cues and ultimately able to state light was bothering his eyes but no dysconjugate gaze this session. Pt educated for progression of transfers and mobility and encouraged movement throughout the day. Pt placed in semichair position end of session.   SpO2 92% on CPAP fiO2 40% HR 107 BP sitting 131/79    Follow Up Recommendations  LTACH;Supervision/Assistance - 24 hour     Equipment Recommendations  Wheelchair (measurements PT);Wheelchair cushion (measurements PT)    Recommendations for Other Services OT consult     Precautions / Restrictions Precautions Precautions: Fall Precaution Comments: vent, trach, cortrak, 3 chest tubes, flexi seal Restrictions Weight Bearing Restrictions: No    Mobility  Bed Mobility Overal bed mobility: Needs Assistance Bed Mobility: Supine to Sit;Sit to Supine     Supine to sit: Max assist;HOB elevated Sit to supine: Max assist;+2 for safety/equipment   General bed mobility comments: physical assist to help move legs toward EOB, rotate trunk to right with cues for use of UE use on rail with pt stating chest soreness with mobility. Max assist to complete rotation to semi-sidelying and rise to sitting. Return to bed with  assist to lift legs and control trunk to surface. Total +2 to slide toward [facility]  Transfers                 General transfer comment: not yet able  Ambulation/Gait                 Stairs             Wheelchair Mobility    Modified Rankin (Stroke Patients Only)       Balance Overall balance assessment: Needs assistance Sitting-balance support: Bilateral upper extremity supported;Feet supported Sitting balance-Leahy Scale: Poor Sitting balance - Comments: pt initially with mod assist to sitting balance with progression to minguard for 20 min EOB with cuse for using bil UE to stabilize. EOB 8 min                                    Cognition Arousal/Alertness: Awake/alert Behavior During Therapy: WFL for tasks assessed/performed Overall Cognitive Status: Difficult to assess Area of Impairment: Following commands;Safety/judgement;Orientation                 Orientation Level: Disoriented to;Time     Following Commands: Follows one step commands inconsistently;Follows one step commands with increased time       General Comments: pt moving bil UE on command with tactile cues but increased time to move bil LE and moving RLE when cued for LLE. When asked date he held up 2 fingers even though nodding head  that he could see 4 on calendar      Exercises General Exercises - Upper Extremity Shoulder Flexion: AAROM;Both;Supine;10 reps General Exercises - Lower Extremity Short Arc Quad: AROM;Both;Seated;5 reps Hip Flexion/Marching: AROM;Both;5 reps;Seated    General Comments        Pertinent Vitals/Pain Pain Assessment: Faces Pain Location: grimace with right shoulder movement and with eyes open and lights on Pain Descriptors / Indicators: Grimacing Pain Intervention(s): Repositioned;Monitored during session    Home Living                      Prior Function            PT Goals (current goals can now be found in the  care plan section) Progress towards PT goals: Progressing toward goals    Frequency    Min 3X/week      PT Plan Current plan remains appropriate    Co-evaluation              AM-PAC PT "6 Clicks" Mobility   Outcome Measure  Help needed turning from your back to your side while in a flat bed without using bedrails?: Total Help needed moving from lying on your back to sitting on the side of a flat bed without using bedrails?: Total Help needed moving to and from a bed to a chair (including a wheelchair)?: Total Help needed standing up from a chair using your arms (e.g., wheelchair or bedside chair)?: Total Help needed to walk in hospital room?: Total Help needed climbing 3-5 steps with a railing? : Total 6 Click Score: 6    End of Session   Activity Tolerance: Patient tolerated treatment well Patient left: in bed;with bed alarm set;with call bell/phone within reach Nurse Communication: Mobility status;Need for lift equipment PT Visit Diagnosis: Other abnormalities of gait and mobility (R26.89);Difficulty in walking, not elsewhere classified (R26.2);Muscle weakness (generalized) (M62.81);Other symptoms and signs involving the nervous system (R29.898)     Time: 7034-0352 PT Time Calculation (min) (ACUTE ONLY): 29 min  Charges:  $Therapeutic Exercise: 8-22 mins $Therapeutic Activity: 8-22 mins                     Jorge Retz P, PT Acute Rehabilitation Services Pager: [phone] Office: [phone]    Yadhira Mckneely B Judianne Seiple 04/27/2020, 9:57 AM

## 2020-04-27 NOTE — Progress Notes (Signed)
VASCULAR LAB    Bilateral upper extremity venous duplex has been performed.  See CV proc for preliminary results.  Gave RN results.  Tykeem Lanzer, RVT 04/27/2020, 4:26 PM

## 2020-04-28 ENCOUNTER — Inpatient Hospital Stay (HOSPITAL_COMMUNITY): Payer: 59

## 2020-04-28 LAB — GLUCOSE, CAPILLARY
Glucose-Capillary: 109 mg/dL — ABNORMAL HIGH (ref 70–99)
Glucose-Capillary: 117 mg/dL — ABNORMAL HIGH (ref 70–99)
Glucose-Capillary: 125 mg/dL — ABNORMAL HIGH (ref 70–99)
Glucose-Capillary: 132 mg/dL — ABNORMAL HIGH (ref 70–99)
Glucose-Capillary: 143 mg/dL — ABNORMAL HIGH (ref 70–99)
Glucose-Capillary: 167 mg/dL — ABNORMAL HIGH (ref 70–99)

## 2020-04-28 LAB — COMPREHENSIVE METABOLIC PANEL
ALT: 161 U/L — ABNORMAL HIGH (ref 0–44)
AST: 47 U/L — ABNORMAL HIGH (ref 15–41)
Albumin: 2.1 g/dL — ABNORMAL LOW (ref 3.5–5.0)
Alkaline Phosphatase: 152 U/L — ABNORMAL HIGH (ref 38–126)
Anion gap: 8 (ref 5–15)
BUN: 15 mg/dL (ref 6–20)
CO2: 33 mmol/L — ABNORMAL HIGH (ref 22–32)
Calcium: 8.4 mg/dL — ABNORMAL LOW (ref 8.9–10.3)
Chloride: 97 mmol/L — ABNORMAL LOW (ref 98–111)
Creatinine, Ser: 0.53 mg/dL — ABNORMAL LOW (ref 0.61–1.24)
GFR, Estimated: >60 mL/min (ref >60)
Glucose, Bld: 137 mg/dL — ABNORMAL HIGH (ref 70–99)
Potassium: 4 mmol/L (ref 3.5–5.1)
Sodium: 138 mmol/L (ref 135–145)
Total Bilirubin: 0.3 mg/dL (ref 0.3–1.2)
Total Protein: 6.7 g/dL (ref 6.5–8.1)

## 2020-04-28 MED ORDER — METOPROLOL TARTRATE 25 MG/10 ML ORAL SUSPENSION: 12.5000 mg | Freq: Three times a day (TID) | ORAL | Status: DC

## 2020-04-28 MED ORDER — OXYCODONE HCL 5 MG/5ML PO SOLN
5.0000 mg | Freq: Four times a day (QID) | ORAL | Status: DC
  Administered 2020-04-29 (×2): 5 mg
  Filled 2020-04-28 (×2): qty 5

## 2020-04-28 MED ORDER — METOPROLOL TARTRATE 25 MG/10 ML ORAL SUSPENSION
12.5000 mg | Freq: Three times a day (TID) | ORAL | Status: DC
  Administered 2020-04-28 – 2020-04-29 (×4): 12.5 mg
  Filled 2020-04-28 (×4): qty 10

## 2020-04-28 NOTE — Evaluation (Signed)
Occupational Therapy Evaluation Patient Details Name: Dennis Howe [name]: [id number] [name]: Feb 03, 1976 Today's Date: 04/28/2020    History of Present Illness Pt is a 44 y/o spanish speaking male presenting to the [facility] ED on 10/2 with chest pain and SOB. Pt was found to have ARDS secondary to having COVID-19. 10/8 pt with subQ air and PTx, Pt was intubated on 10/9, had 2 RT and 1 LT chest tube placed on 10/10, transferred to [facility] 10/11, and had a tracheostomy placed on 10/26. Pt has no significant PMH.   Clinical Impression   PTA patient independent and working. Admitted for above and limited by problem list below, including impaired activity tolerance, decreased endurance, generalized weakness, decreased cardiopulmonary status, impaired balance. Patient requires max assist +2 for bed mobility, max assist -total assist for ADLs, maintained sitting balance at EOB with mod assist to min guard (fluctuating based on UE support, fatigue), and transfers with max assist +2.  He follows simple commands, 'mouths' words at times, but difficult to assess due to vent via trach.  Further visual assessment required.  Patient will benefit from continued OT services while admitted and after dc at [facility], pending progress, to optimize independence with ADLs and decrease burden of care. Will follow.     Follow Up Recommendations  LTACH;Supervision/Assistance - 24 hour    Equipment Recommendations  Other (comment) (TBD)    Recommendations for Other Services       Precautions / Restrictions Precautions Precautions: Fall Precaution Comments: vent, trach, cortrak, 3 chest tubes, flexi seal Restrictions Weight Bearing Restrictions: No      Mobility Bed Mobility Overal bed mobility: Needs Assistance Bed Mobility: Rolling;Sidelying to Sit;Sit to Sidelying Rolling: Max assist;+2 for physical assistance;+2 for safety/equipment Sidelying to sit: Max assist;+2 for physical assistance;+2 for  safety/equipment;HOB elevated Supine to sit: Max assist;HOB elevated;+2 for safety/equipment Sit to supine: Max assist;+2 for safety/equipment;HOB elevated Sit to sidelying: Max assist;+2 for physical assistance;+2 for safety/equipment General bed mobility comments: patient rolling to L and R w ith max assist +2 for linen change and hygiene, from sidelying transitioned to EOB with max assist +2 and returned to supine with max assist +2. cueing for technique, sequencing and line mgmt     Transfers Overall transfer level: Needs assistance   Transfers: Sit to/from Stand Sit to Stand: +2 physical assistance;Max assist         General transfer comment: max assist +2 to power up and steady with 2 person HHA with limited tolerance and did not complete full ascend into standing     Balance Overall balance assessment: Needs assistance Sitting-balance support: Bilateral upper extremity supported;Feet supported Sitting balance-Leahy Scale: Poor Sitting balance - Comments: fluctuates between min guard to mod assist, pt self propping on UEs to support trunk today.  Poor tolerance.    Standing balance support: Bilateral upper extremity supported;During functional activity Standing balance-Leahy Scale: Zero Standing balance comment: +2 max                           ADL either performed or assessed with clinical judgement   ADL Overall ADL's : Needs assistance/impaired     Grooming: Maximal assistance;Sitting Grooming Details (indicate cue type and reason): washing face EOB, poor thoroughness Upper Body Bathing: Sitting;Maximal assistance   Lower Body Bathing: Total assistance;+2 for physical assistance;+2 for safety/equipment;Bed level   Upper Body Dressing : Maximal assistance;Sitting Upper Body Dressing Details (indicate cue type and reason): +2  at [facility] Lower Body Dressing: Total assistance;+2 for physical assistance;+2 for safety/equipment;Bed level     Toilet Transfer  Details (indicate cue type and reason): deferred Toileting- Clothing Manipulation and Hygiene: Total assistance;+2 for physical assistance;+2 for safety/equipment;Bed level       Functional mobility during ADLs: Maximal assistance;+2 for physical assistance;+2 for safety/equipment General ADL Comments: pt limited by weakness, cognition, decreased activity tolerance     Vision   Additional Comments: continue assessment, keeps eyes closed at EOB  without cueing to maintain open; scans towards L and R, some sensitivity to light?      Perception     Praxis      Pertinent Vitals/Pain Pain Assessment: Faces Faces Pain Scale: Hurts little more Pain Location: chest tube sites  Pain Descriptors / Indicators: Grimacing Pain Intervention(s): Monitored during session;Repositioned;Limited activity within patient's tolerance     Hand Dominance Right (reports "both")   Extremity/Trunk Assessment Upper Extremity Assessment Upper Extremity Assessment: Generalized weakness   Lower Extremity Assessment Lower Extremity Assessment: Defer to PT evaluation       Communication Communication Communication: Tracheostomy;Other (comment) (primary language spanish but speaks english)   Cognition Arousal/Alertness: Awake/alert Behavior During Therapy: WFL for tasks assessed/performed Overall Cognitive Status: Difficult to assess Area of Impairment: Following commands;Problem solving;Awareness                 Orientation Level: Person;Place;Time (didn't ask situation)     Following Commands: Follows one step commands with increased time   Awareness: Emergent Problem Solving: Slow processing;Difficulty sequencing;Decreased initiation;Requires verbal cues;Requires tactile cues General Comments: requires multimodal cueing for mobility tasks at times, increased with fatigue; follows simple 1 step tasks with increased time    General Comments  VSS during session, on vent tia trach     Exercises     Shoulder Instructions      Home Living Family/patient expects to be discharged to:: Private residence Living Arrangements: Spouse/significant other Available Help at Discharge: Family;Available 24 hours/day Type of Home: House Home Access: Level entry     Home Layout: Two level     Bathroom Shower/Tub: Chief Strategy Officer: Standard     Home Equipment: None          Prior Functioning/Environment Level of Independence: Independent        Comments: works in [employer]. 63 yo daughter        OT Problem List: Decreased strength;Decreased activity tolerance;Impaired balance (sitting and/or standing);Impaired vision/perception;Decreased coordination;Decreased cognition;Decreased safety awareness;Decreased knowledge of use of DME or AE;Decreased knowledge of precautions;Cardiopulmonary status limiting activity      OT Treatment/Interventions: Self-care/ADL training;Therapeutic exercise;Energy conservation;DME and/or AE instruction;Therapeutic activities;Cognitive remediation/compensation;Balance training;Patient/family education;Visual/perceptual remediation/compensation    OT Goals(Current goals can be found in the care plan section) Acute Rehab OT Goals Patient Stated Goal: get stronger OT Goal Formulation: With patient Time For Goal Achievement: 05/12/20 Potential to Achieve Goals: Good  OT Frequency: Min 3X/week   Barriers to D/C:            Co-evaluation PT/OT/SLP Co-Evaluation/Treatment: Yes Reason for Co-Treatment: Complexity of the patient's impairments (multi-system involvement);For patient/therapist safety;To address functional/ADL transfers PT goals addressed during session: Mobility/safety with mobility;Balance;Strengthening/ROM OT goals addressed during session: ADL's and self-care;Strengthening/ROM      AM-PAC OT "6 Clicks" Daily Activity     Outcome Measure Help from another person eating meals?: Total Help from  another person taking care of personal grooming?: A Lot Help from another person toileting, which includes using toliet, bedpan,  or urinal?: Total Help from another person bathing (including washing, rinsing, drying)?: A Lot Help from another person to put on and taking off regular upper body clothing?: A Lot Help from another person to put on and taking off regular lower body clothing?: Total 6 Click Score: 9   End of Session Nurse Communication: Mobility status;Precautions  Activity Tolerance: Patient tolerated treatment well Patient left: in bed;with call bell/phone within reach;with bed alarm set;with SCD's reapplied  OT Visit Diagnosis: Other abnormalities of gait and mobility (R26.89);Muscle weakness (generalized) (M62.81);Pain;Other symptoms and signs involving cognitive function Pain - part of body:  (chest tube sites)                Time: 1856-3149 OT Time Calculation (min): 37 min Charges:  OT General Charges $OT Visit: 1 Visit OT Evaluation $OT Eval High Complexity: 1 High  Barry Brunner, OT Acute Rehabilitation Services Pager [phone] Office [phone]   Chancy Milroy 04/28/2020, 12:34 PM

## 2020-04-28 NOTE — Progress Notes (Signed)
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  Treated with steroids and remdesivir. Received Actemra 10/3 & 10/6 .  Due to rising D-dimer lower extremity ultrasound and echo was obtained.  PCCM consulted 10/8  Past Medical History  None  Significant Hospital Events   10/2 hospital admission 10/8 subcutaneous air in neck and?  Right apical pneumothorax versus artifact 10/9 intubated due to prolonged desaturation to 70s without recovery 10/10 1 chest tube on left and 2 chest tubes placed on right due to recurrent ptx 10/11 transferred to [facility] for ECMO consideration 10/11 prone ventilation initiated 10/14 started on APRV 10/15 prone ventilation discontinued 10/19 Recurrent R pneumo despite two R-sided chest tubes, suction increased to 40 with resolution 10/26 Trachedostomy placed  Consults:    Procedures:  ETT 10/9 > 10/26 Right IJ CVL 10/9 >> removed Right chest tube x2 10/10>> Lt Chest tube 10/10>> Perc Trach 10/26  Significant Diagnostic Tests:  Chest x-ray 10/8 subcutaneous air in neck,?  Right apical pneumothorax 10/7 venous duplex negative BLE 10/3 echo nmlLVEF 10/31 ventilator malfunction with significant desaturation, O2 increased, weaning.  Micro Data:  10/2 Bc >> ng 10/07 MRSA PCR >> negative 10/9 resp >> GPC, GNR -abundant Haemophilus influenzae 10/24 respiratory culture >> gram-positive rods    cefepime10/9 -10/11 Vancomycin10/9- 10/12 Ceftriaxone 10/12-10/18, 10/21- 10/24 Unasyn 10/26 > 11/1 Azithromycin 10/26 > 10/21  Cefdinir 11/3>>  Interim history/subjective:   No acute issues overnight. Chest tube drainage is minimal and no air leaks present on left or right chest tubes. Discussed chest radiographs with radiology and confirmed no persistent pneumothorax.   Objective   Blood pressure (!) 145/85, pulse (!) 118, temperature 99.8 F (37.7 C), temperature source Oral, resp. rate (!) 27, height 5\' 11"  (1.803 m), weight 83.9 kg, SpO2 92 %.    Vent Mode: PSV;CPAP FiO2 (%):  [40 %] 40 % Set Rate:  [26 bmp] 26 bmp Vt Set:  [460 mL] 460 mL PEEP:  [5 cmH20] 5 cmH20 Pressure Support:  [10 cmH20] 10 cmH20 Plateau Pressure:  [19 cmH20-22 cmH20] 22 cmH20   Intake/Output Summary (Last 24 hours) at 04/28/2020 13/10/2019 Last data filed at 04/28/2020 0800 Gross per 24 hour  Intake 1789.97 ml  Output 1168 ml  Net 621.97 ml   Filed Weights   04/26/20 0500 04/27/20 0500 04/28/20 0500  Weight: 83.1 kg 83 kg 83.9 kg   General: Chronically ill-appearing M, s/p trach, alert HEENT: mucous membranes are moist, CorTrak secured and intact  Neuro: Patient opens eyes with vocal stimuli, pupils are equal bilateral reactive to light CV: s1s2 rrr, no m/r/g,  PULM: Tachypneic,Two R-sided and one L-sided chest tube, no air leak on suction. Slight drainage around left chest tube and superior right chest tube. GI: soft, bsx4 active, ND, NT  Extremities: warm/dry,  Skin: stage 1 Deep tissue inury noted on sacrum  Resolved Hospital Problem list   AKI/hyperkalemia/hypokalemia Corynebacterium Pneumonia  Assessment & Plan:  Acute hypoxic/hypercapnic respiratory failure due to ARDS from COVID-19 pneumonia status post trach  Continue to wean mechanical ventilation. PS trials as tolerated Patient completed treatment with remdesivir Tocilizumab and Solu-Medrol RASS goal of 0 Titrate  down oxycodone to 10 mg every 8 hours, and tapered Klonopin to 1mg  TID. Would keep tapering these as tolerated  Continue Seroquel to 200 mg twice daily  Bilateral pneumothoraces, pneumomediastinum  with subcutaneous emphysema  - No air leak today Chest imaging demonstrating resolution of bilateral pneumothoraces as confirmed by radiology via phone - Reduced right chest tube suction from -40 to -20cmH2O and place left chest tube to water seal. Will repeat chest radiograph this afternoon. If stable, will place right sided chest tubes to water seal as well. Plan will be to perform clamping trial tomorrow and remove chest tubes on right if able.   Fevers - respiratory cultures unremarkable. Mild erythema and warmth noted at the insertion sites. will treat for SSTI with cefdinir x 5 days total - Upper extremity doppler confirmed clot around PICC line which has been removed  Sinus Tachycardia - no evidence of assocation with hypoxemia, hypotension, or fevers.  - Likely secondary to critical illness or residual post-covid state.  - Start metoprolol 12.5mg  TID  Best practice:  Diet: Tube feed Pain/Anxiety/Delirium protocol (if indicated): as above VAP protocol (if indicated): HOB 30 degrees, suction prn DVT prophylaxis: Lovenox GI prophylaxis: Protonix Glucose control: SSI Mobility: Bedrest  Code Status: Full Family Communication: wife updated over phone and at bedside 11/4.  Disposition: ICU. Patient has LTACH benefits and is waiting for medical stability   CC Time: 45 minutes  13/4, MD [facility] Office: [phone]   See Amion for Pager Details

## 2020-04-28 NOTE — Progress Notes (Signed)
EDCSW received call from [facility] at [facility] [phone]. [phone] states that Pt was approved for [facility] and there is a bed available for the Pt on Saturday 04/29/20 if provider is willing to d/c.

## 2020-04-28 NOTE — Progress Notes (Signed)
Physical Therapy Treatment Patient Details Name: Dennis Howe [name]: [id number] [name]: 06-22-1976 Today's Date: 04/28/2020    History of Present Illness Pt is a 44 y/o spanish speaking male presenting to the [facility] ED on 10/2 with chest pain and SOB. Pt was found to have ARDS secondary to having COVID-19. 10/8 pt with subQ air and PTx, Pt was intubated on 10/9, had 2 RT and 1 LT chest tube placed on 10/10, transferred to [facility] 10/11, and had a tracheostomy placed on 10/26. Pt has no significant PMH.    PT Comments    Pt was found in the room with with condom catheter off and bed wet. Increased time was taken to change bed and mobilize pts in order to get him and his bed cleaned up. After pt was cleaned up, he was set EOB and tolerated this well with no nausea/dizziness, as compared to yesterday. Pts sitting balance has improved, since he is able to support himself with bilateral UE without being cued and would range from min guard to moderate assist. During EOB, pts BP was 125/89 and MAP=100. During standing, pt only complained of hip pain. Throughout mobility, RR ranged from 27-45 breaths per minute, HR was 115-130 bpm, and SpO2=92% on CPAP FiO2=40%. Pt became limited by fatigue and chest pain and requested to lay back down.    Follow Up Recommendations  LTACH;Supervision/Assistance - 24 hour     Equipment Recommendations  Wheelchair (measurements PT);Wheelchair cushion (measurements PT)    Recommendations for Other Services       Precautions / Restrictions Restrictions Weight Bearing Restrictions: No    Mobility  Bed Mobility Overal bed mobility: Needs Assistance Bed Mobility: Supine to Sit;Sit to Supine;Rolling Rolling: Max assist (rolled to both sides x2 in order to change bed)   Supine to sit: Max assist;HOB elevated;+2 for safety/equipment Sit to supine: Max assist;+2 for safety/equipment;HOB elevated   General bed mobility comments: physical assistance needed to  roll to bilateral sides with cues to hold use his UE to hold onto the rail. Pt stating that he had chest soreness near his chest tubes. Pt needed physical assistance to move legs towards EOB. Pt needed cues to use R UE to help push himself up into sitting. Total +2 to slide towards HOB in the trendelenburg position at the end of tx.  Transfers Overall transfer level: Needs assistance   Transfers: Sit to/from Stand Sit to Stand: +2 physical assistance         General transfer comment: max assist +2 for blocking the knees and using the pad to bring hips forward. Pts arms were locked in between PTs bodies since he wasn't able to give physical assitance with his UE's. PTs attempted additional trials but pt refused  Ambulation/Gait                 Stairs             Wheelchair Mobility    Modified Rankin (Stroke Patients Only)       Balance Overall balance assessment: Needs assistance Sitting-balance support: Bilateral upper extremity supported;Feet supported Sitting balance-Leahy Scale: Poor Sitting balance - Comments: pt was able to perform session of 20 seconds of min guard static sitting balance with bilateral UE sitting EOB, then would go back to being mod assist. Pt would sway anterior to posterior, so he was continued to be cued to open his eyes. EOB for 10 minutes.   Standing balance support: During functional activity;Bilateral upper extremity  supported Standing balance-Leahy Scale: Zero Standing balance comment: Pt was able to perform 10 seconds standing with max assist +2 needed for balance                            Cognition Arousal/Alertness: Awake/alert Behavior During Therapy: WFL for tasks assessed/performed Overall Cognitive Status: Difficult to assess Area of Impairment: Following commands                 Orientation Level: Person;Place;Time (didn't ask situation)     Following Commands: Follows one step commands with increased  time       General Comments: pt took increased time to roll to his side and grasp on to PTs arm during sit to stand      Exercises      General Comments        Pertinent Vitals/Pain Pain Assessment: Faces Pain Location: grimacing when eyes were open as well as secondary to pain at chest tube insertion pain Pain Descriptors / Indicators: Grimacing Pain Intervention(s): Monitored during session;Repositioned    Home Living                      Prior Function            PT Goals (current goals can now be found in the care plan section) Progress towards PT goals: Progressing toward goals    Frequency    Min 3X/week      PT Plan Current plan remains appropriate    Co-evaluation PT/OT/SLP Co-Evaluation/Treatment: Yes Reason for Co-Treatment: Complexity of the patient's impairments (multi-system involvement);For patient/therapist safety;Necessary to address cognition/behavior during functional activity PT goals addressed during session: Mobility/safety with mobility;Balance;Strengthening/ROM        AM-PAC PT "6 Clicks" Mobility   Outcome Measure  Help needed turning from your back to your side while in a flat bed without using bedrails?: Total Help needed moving from lying on your back to sitting on the side of a flat bed without using bedrails?: Total Help needed moving to and from a bed to a chair (including a wheelchair)?: Total Help needed standing up from a chair using your arms (e.g., wheelchair or bedside chair)?: Total Help needed to walk in hospital room?: Total Help needed climbing 3-5 steps with a railing? : Total 6 Click Score: 6    End of Session   Activity Tolerance: Patient limited by fatigue Patient left: in bed;with bed alarm set;with call bell/phone within reach   PT Visit Diagnosis: Other abnormalities of gait and mobility (R26.89);Difficulty in walking, not elsewhere classified (R26.2);Muscle weakness (generalized) (M62.81);Other  symptoms and signs involving the nervous system (W09.811)     Time: 9147-8295 PT Time Calculation (min) (ACUTE ONLY): 39 min  Charges:  $Therapeutic Activity: 23-37 mins                     Jeri Cos, SPT [id number]   Dennis Howe 04/28/2020, 10:41 AM

## 2020-04-29 ENCOUNTER — Inpatient Hospital Stay
Admission: RE | Admit: 2020-04-29 | Discharge: 2020-05-25 | Disposition: A | Discharge status: Home / Self Care | Payer: 59 | Attending: Internal Medicine | Admitting: Internal Medicine

## 2020-04-29 ENCOUNTER — Inpatient Hospital Stay (HOSPITAL_COMMUNITY): Payer: 59

## 2020-04-29 DIAGNOSIS — J9621 Acute and chronic respiratory failure with hypoxia: Secondary | ICD-10-CM | POA: Diagnosis present

## 2020-04-29 DIAGNOSIS — J189 Pneumonia, unspecified organism: Secondary | ICD-10-CM

## 2020-04-29 DIAGNOSIS — J939 Pneumothorax, unspecified: Secondary | ICD-10-CM | POA: Diagnosis present

## 2020-04-29 DIAGNOSIS — J942 Hemothorax: Secondary | ICD-10-CM

## 2020-04-29 DIAGNOSIS — J9690 Respiratory failure, unspecified, unspecified whether with hypoxia or hypercapnia: Secondary | ICD-10-CM

## 2020-04-29 DIAGNOSIS — U071 COVID-19: Secondary | ICD-10-CM | POA: Diagnosis present

## 2020-04-29 DIAGNOSIS — R1310 Dysphagia, unspecified: Secondary | ICD-10-CM

## 2020-04-29 DIAGNOSIS — J1282 Pneumonia due to coronavirus disease 2019: Secondary | ICD-10-CM | POA: Diagnosis present

## 2020-04-29 DIAGNOSIS — Z4659 Encounter for fitting and adjustment of other gastrointestinal appliance and device: Secondary | ICD-10-CM

## 2020-04-29 HISTORY — DX: Pneumonia due to coronavirus disease 2019: J12.82

## 2020-04-29 HISTORY — DX: COVID-19: U07.1

## 2020-04-29 HISTORY — DX: Pneumothorax, unspecified: J93.9

## 2020-04-29 HISTORY — DX: Acute and chronic respiratory failure with hypoxia: J96.21

## 2020-04-29 LAB — CBC
HCT: 31.5 % — ABNORMAL LOW (ref 39.0–52.0)
Hemoglobin: 9.7 g/dL — ABNORMAL LOW (ref 13.0–17.0)
MCH: 29.5 pg (ref 26.0–34.0)
MCHC: 30.8 g/dL (ref 30.0–36.0)
MCV: 95.7 fL (ref 80.0–100.0)
Platelets: 540 10*3/uL — ABNORMAL HIGH (ref 150–400)
RBC: 3.29 MIL/uL — ABNORMAL LOW (ref 4.22–5.81)
RDW: 14.1 % (ref 11.5–15.5)
WBC: 8.6 10*3/uL (ref 4.0–10.5)
nRBC: 0.2 % (ref 0.0–0.2)

## 2020-04-29 LAB — GLUCOSE, CAPILLARY
Glucose-Capillary: 119 mg/dL — ABNORMAL HIGH (ref 70–99)
Glucose-Capillary: 139 mg/dL — ABNORMAL HIGH (ref 70–99)
Glucose-Capillary: 159 mg/dL — ABNORMAL HIGH (ref 70–99)
Glucose-Capillary: 145 mg/dL — ABNORMAL HIGH (ref 70–99)

## 2020-04-29 LAB — BASIC METABOLIC PANEL
Anion gap: 8 (ref 5–15)
BUN: 15 mg/dL (ref 6–20)
CO2: 32 mmol/L (ref 22–32)
Calcium: 8.4 mg/dL — ABNORMAL LOW (ref 8.9–10.3)
Chloride: 97 mmol/L — ABNORMAL LOW (ref 98–111)
Creatinine, Ser: 0.47 mg/dL — ABNORMAL LOW (ref 0.61–1.24)
GFR, Estimated: >60 mL/min (ref >60)
Glucose, Bld: 147 mg/dL — ABNORMAL HIGH (ref 70–99)
Potassium: 4.2 mmol/L (ref 3.5–5.1)
Sodium: 137 mmol/L (ref 135–145)

## 2020-04-29 MED ORDER — CEFDINIR 250 MG/5ML PO SUSR: 300.0000 mg | Freq: Two times a day (BID) | ORAL | 0 refills | Status: DC

## 2020-04-29 MED ORDER — INSULIN ASPART 100 UNIT/ML ~~LOC~~ SOLN
0.0000 [IU] | SUBCUTANEOUS | 11 refills | Status: DC
Start: 2020-04-29 — End: 2020-08-08

## 2020-04-29 MED ORDER — OXYCODONE HCL 5 MG/5ML PO SOLN: 5.0000 mg | Freq: Four times a day (QID) | ORAL | 0 refills | Status: DC

## 2020-04-29 MED ORDER — CLONAZEPAM 1 MG PO TBDP
1.0000 mg | ORAL_TABLET | Freq: Two times a day (BID) | ORAL | 0 refills | Status: DC
Start: 2020-04-29 — End: 2020-08-08

## 2020-04-29 MED ORDER — PANTOPRAZOLE SODIUM 40 MG PO PACK
40.0000 mg | PACK | Freq: Every day | ORAL | Status: DC
Start: 2020-04-30 — End: 2020-08-08

## 2020-04-29 MED ORDER — CLONAZEPAM 0.5 MG PO TBDP: 1.0000 mg | ORAL_TABLET | Freq: Two times a day (BID) | ORAL | Status: DC

## 2020-04-29 MED ORDER — VITAL 1.5 CAL PO LIQD
1000.0000 mL | ORAL | Status: DC
Start: 2020-04-29 — End: 2020-08-08

## 2020-04-29 MED ORDER — METOPROLOL TARTRATE 25 MG/10 ML ORAL SUSPENSION
12.5000 mg | Freq: Three times a day (TID) | ORAL | 0 refills | Status: DC
Start: 2020-04-29 — End: 2020-08-08

## 2020-04-29 MED ORDER — ENOXAPARIN SODIUM 40 MG/0.4ML ~~LOC~~ SOLN
40.0000 mg | SUBCUTANEOUS | Status: DC
Start: 2020-04-29 — End: 2020-08-08

## 2020-04-29 MED ORDER — NUTRISOURCE FIBER PO PACK
1.0000 | PACK | Freq: Two times a day (BID) | ORAL | Status: DC
Start: 2020-04-29 — End: 2020-08-08

## 2020-04-29 MED ORDER — PROSOURCE TF PO LIQD
45.0000 mL | Freq: Every day | ORAL | Status: DC
Start: 2020-04-29 — End: 2020-08-08

## 2020-04-29 MED ORDER — QUETIAPINE FUMARATE 200 MG PO TABS: 200.0000 mg | ORAL_TABLET | Freq: Two times a day (BID) | ORAL | Status: DC

## 2020-04-29 NOTE — Progress Notes (Signed)
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  Treated with steroids and remdesivir. Received Actemra 10/3 & 10/6 .  Due to rising D-dimer lower extremity ultrasound and echo was obtained.  PCCM consulted 10/8  Past Medical History  None  Significant Hospital Events   10/2 hospital admission 10/8 subcutaneous air in neck and?  Right apical pneumothorax versus artifact 10/9 intubated due to prolonged desaturation to 70s without recovery 10/10 1 chest tube on left and 2 chest tubes placed on right due to recurrent ptx 10/11 transferred to [facility] for ECMO consideration 10/11 prone ventilation initiated 10/14 started on APRV 10/15 prone ventilation discontinued 10/19 Recurrent R pneumo despite two R-sided chest tubes, suction increased to 40 with resolution 10/26 Trachedostomy placed  Consults:    Procedures:  ETT 10/9 > 10/26 Right IJ CVL 10/9 >> removed Right chest tube x2 10/10>> Lt Chest tube 10/10>> Perc Trach 10/26  Significant Diagnostic Tests:  Chest x-ray 10/8 subcutaneous air in neck,?  Right apical pneumothorax 10/7 venous duplex negative BLE 10/3 echo nmlLVEF 10/31 ventilator malfunction with significant desaturation, O2 increased, weaning.  Micro Data:  10/2 Bc >> ng 10/07 MRSA PCR >> negative 10/9 resp >> GPC, GNR -abundant Haemophilus influenzae 10/24 respiratory culture >> gram-positive rods    cefepime10/9 -10/11 Vancomycin10/9- 10/12 Ceftriaxone 10/12-10/18, 10/21- 10/24 Unasyn 10/26 > 11/1 Azithromycin 10/26 > 10/21  Cefdinir 11/3>>  Interim history/subjective:   No acute issues overnight. Chest tubes placed to water seal overnight with no recurrence of pneumothorax. Chest tubes clamped for trial.   Objective   Blood pressure 110/78, pulse (!) 112, temperature 97.9 F (36.6 C), temperature source Oral, resp. rate (!) 32, height 5\' 11"  (1.803 m), weight 82.8 kg, SpO2 94 %.    Vent Mode: PRVC FiO2 (%):  [40 %] 40 % Set Rate:  [23 bmp-26 bmp] 26 bmp Vt Set:  [460 mL] 460 mL PEEP:  [5 cmH20] 5 cmH20 Plateau Pressure:  [16 cmH20-22 cmH20] 22 cmH20   Intake/Output Summary (Last 24 hours) at 04/29/2020 1436 Last data filed at 04/29/2020 1100 Gross per 24 hour  Intake 1840 ml  Output 1555 ml  Net 285 ml   Filed Weights   04/27/20 0500 04/28/20 0500 04/29/20 0409  Weight: 83 kg 83.9 kg 82.8 kg   General: Chronically ill-appearing M, s/p trach, alert HEENT: mucous membranes are moist, CorTrak secured and intact  Neuro: Patient opens eyes with vocal stimuli, pupils are equal bilateral reactive to light CV: s1s2 rrr, no m/r/g,  PULM: Tachypneic,Two R-sided and one L-sided chest tube, no air leak on water seal. \ GI: soft, bsx4 active, ND, NT  Extremities: warm/dry,  Skin: stage 1 Deep tissue inury noted on sacrum  Resolved Hospital Problem list   AKI/hyperkalemia/hypokalemia Corynebacterium Pneumonia  Assessment & Plan:  Acute hypoxic/hypercapnic respiratory failure due to ARDS from COVID-19 pneumonia status post trach  Continue to wean mechanical ventilation. PS trials as tolerated Patient completed treatment with remdesivir Tocilizumab and Solu-Medrol RASS goal of 0 Titrate down oxycodone to 5 mg every 6 hours, and tapered Klonopin to 1mg  BID. Would keep tapering these as tolerated  Continue Seroquel to 200  mg twice daily and can start to taper as able  Bilateral pneumothoraces, pneumomediastinum with subcutaneous emphysema  - No recurrence of pneumthorax upon clamp trial based on recent chest radiograph. - Will leave chest tubes in  place as he is [facility] and discussed chest tube removal with physician taking over patient's care there, Dr. Sharyon Medicus  Fevers - respiratory cultures unremarkable. Mild erythema and warmth noted at the insertion sites. will treat for SSTI with cefdinir x 5 days total - Upper extremity doppler confirmed clot around PICC line which has been removed  Sinus Tachycardia - no evidence of assocation with hypoxemia, hypotension, or fevers.  - Likely secondary to critical illness or residual post-covid state.  - Continue metoprolol 12.5mg  TID  Best practice:  Diet: Tube feed Pain/Anxiety/Delirium protocol (if indicated): as above VAP protocol (if indicated): HOB 30 degrees, suction prn DVT prophylaxis: Lovenox GI prophylaxis: Protonix Glucose control: SSI Mobility: Bedrest  Code Status: Full Family Communication: wife updated at bedside 11/6 Disposition: transfer to [facility] today  CC Time: 45 minutes  Melody Comas, MD [place] Office: [phone]   See Amion for Pager Details

## 2020-04-29 NOTE — Progress Notes (Signed)
Pt transported with RT and RN x2 from [facility] to [facility] room 18. Pt bagged on 100% FiO2 with Hepa filter in place. Pt tolerated well. Report given at bedside to [facility] RT. Care resumed by [facility] RT at this time.

## 2020-04-29 NOTE — Progress Notes (Signed)
CPT held at this time. Pt returned to full support settings for increased WOB, tachypnea. RT will continue to monitor.

## 2020-04-29 NOTE — TOC Transition Note (Addendum)
Transition of Care [facility]) - CM/SW Discharge Note   Patient Details  Name: Castle Lamons [name]: [id number] Date of Birth: [birth year]  [facility] CM/SW Contact:  Deveron Furlong, RN 04/29/2020, 1:50 PM   Clinical Narrative:    Victorino Dike with [facility] states patient can d/c to [facility] today if medically stable.  Dr. Francine Graven and RN advised.  Patient will go to Room 18.  Number to call report is [phone].  MD may call Dr. Sharyon Medicus if he wishes at [phone].  Will need to fax DC Summary to [phone].    Per RN, may go this pm.  Liaison, Victorino Dike, advised.     Final next level of care: Long Term Acute Care (LTAC) Barriers to Discharge: No Barriers Identified   Patient Goals and CMS Choice Patient states their goals for this hospitalization and ongoing recovery are:: unable to state due to vent CMS Medicare.gov Compare Post Acute Care list provided to:: Patient     Discharge Plan and Services   Discharge Planning Services: CM Consult

## 2020-04-29 NOTE — Progress Notes (Signed)
   04/29/20 1330  Clinical Encounter Type  Visited With Patient and family together  Visit Type Spiritual support  Referral From Nurse  Consult/Referral To Chaplain  Chaplain responded- Wife at bedside. Mrs. Mancera requested prayer for her strength and for the patient's healing. This note was prepared by Deneen Harts, M.Div..  For questions please contact by phone [phone].

## 2020-04-29 NOTE — Discharge Summary (Signed)
Physician Discharge Summary  Patient ID: Dennis Howe [name]: [id number] [name]/AGE: 03/18/1976 44 y.o.  Admit date: 03/25/2020 Discharge date: 04/29/2020  Problem List Principal Problem:   Acute respiratory distress syndrome (ARDS) due to COVID-19 virus [facility](HCC) Active Problems:   Chest tube in place   Elevated LFTs   Pneumothorax   Acute respiratory failure [facility](HCC)  HPI: 44 yo M admitted for hypoxic respiratory failure due to COVID-19 10/2.  He received COVID-related treatment including steroids, actemra and remdesivir. On 10/8 PCCM consulted He was intubated 10/9 and had multiple chest tubes placed 10/10 for management of penumothoraces. On 10/11 he was transferred from [facility] to [facility]. 10/11 started on prone ventilation through 10/15. The patient had recurrent pneumothorax on 10/19 and chest tube suction increased to 40. The patient underwent tracheostomy placement on 10/26. He has tolerated brief trach collar trials but largely requires ongoing ventilatory support.   Hospital Course: 10/2 admitted for COVID-19 related respiratory failure 10/7 ECHO obtained due to elevated DDimer but windows were suboptimal for interpretation. 10/8 critical care was consulted due to worsening respiratory status and CXR with findings of small R apical pneumothorax, pneumomediastinum and subcutaneous emphysema. 10/9 Intubated 10/10 Chest tube insertion 10/11 Transferred to [facility] ICU. ECMO consulted -- gas exchange did not necessitate deployment of VV ECMO but pt deemed suitable candidate should condition worsen and become indicated. Prone positioning initiated 10/26 the patient underwent tracheostomy 11/4 superficial R cephalic venous thrombosis identified 11/5 despite efforts at weaning from vent, pt unable to tolerate trach collar for significant time and remains vent dependent. Minimal chest tube output 11/6 Chest tubes clamped  General: Chronically ill appearing middle aged M,  trach vent HEENT: NCAT. Trach secure. Cortrak secure  Neuro: Awakens to voice. PERRLA CV: rrr s1s2 cap refill brisk  PULM: Mechanically ventilated. Bilateral chest tubes: 2 right sided and 1 L sided, clamped. GI: soft round ndnt Extremities: warm, dry, no cyanosis or clubbing Skin: clean dry warm. Sacral tissue injury.   Labs at discharge Lab Results  Component Value Date   CREATININE 0.47 (L) 04/29/2020   BUN 15 04/29/2020   NA 137 04/29/2020   K 4.2 04/29/2020   CL 97 (L) 04/29/2020   CO2 32 04/29/2020   Lab Results  Component Value Date   WBC 8.6 04/29/2020   HGB 9.7 (L) 04/29/2020   HCT 31.5 (L) 04/29/2020   MCV 95.7 04/29/2020   PLT 540 (H) 04/29/2020   Lab Results  Component Value Date   ALT 161 (H) 04/28/2020   AST 47 (H) 04/28/2020   ALKPHOS 152 (H) 04/28/2020   BILITOT 0.3 04/28/2020   No results found for: INR, PROTIME  Current radiology studies DG CHEST PORT 1 VIEW  Result Date: 04/29/2020 CLINICAL DATA:  Follow-up pneumothorax, COVID-19 EXAM: PORTABLE CHEST 1 VIEW COMPARISON:  Chest radiograph from one day prior. FINDINGS: Stable position of 2 right and single left upper chest tubes. Tracheostomy tube tip overlies the tracheal air column at the level of the thoracic inlet. Enteric tube terminates over the distal stomach. Stable cardiomediastinal silhouette with top-normal heart size. No pneumothorax. No pleural effusion. Low lung volumes. Patchy opacities throughout both lungs, not appreciably changed. IMPRESSION: 1. No pneumothorax. Well-positioned support structures. 2. Low lung volumes with stable patchy opacities throughout both lungs, compatible with COVID-19 pneumonia. Electronically Signed   By: Delbert PhenixJason A Poff M.D.   On: 04/29/2020 08:09   DG CHEST PORT 1 VIEW  Result Date: 04/28/2020 CLINICAL  DATA:  44 year old male with a history of pneumothorax, COVID positive EXAM: PORTABLE CHEST 1 VIEW COMPARISON:  Multiple prior most recent 04/26/2020 FINDINGS:  Cardiomediastinal silhouette unchanged. Low lung volumes persist. 2 right-sided thoracostomy tube and 1 left-sided thoracostomy tube, unchanged in position. No visualized pneumothorax. Tracheostomy unchanged. Enteric feeding tube projects over the mediastinum and terminates out of the field of view, unchanged. Similar low lung volumes with mixed reticulonodular opacities. IMPRESSION: Similar appearance of low lung volumes and mixed reticulonodular opacity compatible with the given history. Unchanged position of 2 right-sided and single left thoracostomy tube with no residual pneumothorax identified. Unchanged tracheostomy and enteric feeding tube. Electronically Signed   By: Gilmer Mor D.O.   On: 04/28/2020 16:11   VAS [place] LOWER EXTREMITY VENOUS (DVT)  Result Date: 04/27/2020  Lower Venous DVT Study Indications: Fever.  Limitations: Ventilation. Comparison Study: No prior study Performing Technologist: Sherren Kerns RVS  Examination Guidelines: A complete evaluation includes B-mode imaging, spectral Doppler, color Doppler, and power Doppler as needed of all accessible portions of each vessel. Bilateral testing is considered an integral part of a complete examination. Limited examinations for reoccurring indications may be performed as noted. The reflux portion of the exam is performed with the patient in reverse Trendelenburg.  +---------+---------------+---------+-----------+----------+-------------------+ RIGHT    CompressibilityPhasicitySpontaneityPropertiesThrombus Aging      +---------+---------------+---------+-----------+----------+-------------------+ CFV      Full           Yes      Yes                                      +---------+---------------+---------+-----------+----------+-------------------+ SFJ      Full                                                             +---------+---------------+---------+-----------+----------+-------------------+ FV Prox  Full                                                              +---------+---------------+---------+-----------+----------+-------------------+ FV Mid   Full                                                             +---------+---------------+---------+-----------+----------+-------------------+ FV DistalFull                                                             +---------+---------------+---------+-----------+----------+-------------------+ PFV      Full                                                             +---------+---------------+---------+-----------+----------+-------------------+  POP      Full           Yes      Yes                                      +---------+---------------+---------+-----------+----------+-------------------+ PTV                                                   Patent by color and                                                       Doppler             +---------+---------------+---------+-----------+----------+-------------------+ PERO                                                  Patent by color and                                                       Doppler             +---------+---------------+---------+-----------+----------+-------------------+   +---------+---------------+---------+-----------+----------+--------------+ LEFT     CompressibilityPhasicitySpontaneityPropertiesThrombus Aging +---------+---------------+---------+-----------+----------+--------------+ CFV      Full           Yes      Yes                                 +---------+---------------+---------+-----------+----------+--------------+ SFJ      Full                                                        +---------+---------------+---------+-----------+----------+--------------+ FV Prox  Full                                                         +---------+---------------+---------+-----------+----------+--------------+ FV Mid   Full                                                        +---------+---------------+---------+-----------+----------+--------------+ FV DistalFull                                                        +---------+---------------+---------+-----------+----------+--------------+  PFV      Full                                                        +---------+---------------+---------+-----------+----------+--------------+ POP      Full           Yes      Yes                                 +---------+---------------+---------+-----------+----------+--------------+ PTV      Full                                                        +---------+---------------+---------+-----------+----------+--------------+ PERO     Full                                                        +---------+---------------+---------+-----------+----------+--------------+     Summary: BILATERAL: - No evidence of deep vein thrombosis seen in the lower extremities, bilaterally. -   *See table(s) above for measurements and observations. Electronically signed by Heath Lark on 04/27/2020 at 7:05:56 PM.    Final    VAS [place] UPPER EXTREMITY VENOUS DUPLEX  Result Date: 04/27/2020 UPPER VENOUS STUDY  Indications: fever of unknown origin, recent Covid-19 infection Limitations: Trach collar, ventilation. Comparison Study: No prior study Performing Technologist: Sherren Kerns RVS  Examination Guidelines: A complete evaluation includes B-mode imaging, spectral Doppler, color Doppler, and power Doppler as needed of all accessible portions of each vessel. Bilateral testing is considered an integral part of a complete examination. Limited examinations for reoccurring indications may be performed as noted.  Right Findings: +----------+------------+---------+-----------+----------+--------------+ RIGHT      CompressiblePhasicitySpontaneousProperties   Summary     +----------+------------+---------+-----------+----------+--------------+ IJV                                                 Not visualized +----------+------------+---------+-----------+----------+--------------+ Subclavian               Yes       Yes                             +----------+------------+---------+-----------+----------+--------------+ Axillary      Full       Yes       Yes                             +----------+------------+---------+-----------+----------+--------------+ Brachial      Full       Yes       Yes                             +----------+------------+---------+-----------+----------+--------------+ Radial  Full                                                 +----------+------------+---------+-----------+----------+--------------+ Cephalic      Full                                                 +----------+------------+---------+-----------+----------+--------------+ Basilic       Full                                                 +----------+------------+---------+-----------+----------+--------------+  Left Findings: +----------+------------+---------+-----------+----------+-----------------+ LEFT      CompressiblePhasicitySpontaneousProperties     Summary      +----------+------------+---------+-----------+----------+-----------------+ IJV                                                  Not visualized   +----------+------------+---------+-----------+----------+-----------------+ Subclavian                                           Not visualized   +----------+------------+---------+-----------+----------+-----------------+ Axillary                 Yes       Yes                                +----------+------------+---------+-----------+----------+-----------------+ Brachial      Full       Yes       Yes                                 +----------+------------+---------+-----------+----------+-----------------+ Cephalic      None                                  Acute (PICC line) +----------+------------+---------+-----------+----------+-----------------+ Basilic       Full                                                    +----------+------------+---------+-----------+----------+-----------------+  Summary:  Right: Findings consistent with acute superficial vein thrombosis involving the right cephalic vein.  *See table(s) above for measurements and observations.  Diagnosing physician: Heath Lark Electronically signed by Heath Lark on 04/27/2020 at 7:05:40 PM.    Final    Plan:  Acute hypoxic and hypercapnic respiratory failure due to ARDS from COVID-19 PNA, now s/p tracheostomy -continue to attempt weaning from mechanical ventilation -has tolerated TID Clonazepam 1mg , Oxycodone 10mg  q8hr, Seroquel 200mg  BID for ventilator related sedation -continue pulmonary hygiene  Bilateral pneumothoraces s/p chest tube placements -2 R  sided 1 L sided. Stable CXR after chest tube clamping trial -Continue chest tube clamping. Recommend that chest tubes be removed one at a time as appropriate  Fever -unremarkable cultures. Superficial venous thrombosis identified around PICC, PICC removed  -Treating SSTI with cefdinir x 5 days given erythema noted at insertion site  Tachycardia -Metoprolol 12.5mg  TID  Inadequate PO intake -EN via cortrak   Hyperglycemia -Sliding scale insulin     Disposition: [facility]      Allergies as of 04/29/2020   No Known Allergies     Medication List    STOP taking these medications   benzonatate 100 MG capsule Commonly known as: TESSALON   ondansetron 4 MG disintegrating tablet Commonly known as: Zofran ODT   promethazine-dextromethorphan 6.25-15 MG/5ML syrup Commonly known as: PROMETHAZINE-DM     TAKE these medications   cefdinir 250 MG/5ML  suspension Commonly known as: OMNICEF Place 6 mLs (300 mg total) into feeding tube 2 (two) times daily.   clonazePAM 1 MG disintegrating tablet Commonly known as: KLONOPIN Place 1 tablet (1 mg total) into feeding tube 2 (two) times daily.   enoxaparin 40 MG/0.4ML injection Commonly known as: LOVENOX Inject 0.4 mLs (40 mg total) into the skin daily.   feeding supplement (PROSource TF) liquid Place 45 mLs into feeding tube 5 (five) times daily.   feeding supplement (VITAL 1.5 CAL) Liqd Place 1,000 mLs into feeding tube continuous.   fiber Pack packet Place 1 packet into feeding tube 2 (two) times daily.   insulin aspart 100 UNIT/ML injection Commonly known as: novoLOG Inject 0-15 Units into the skin every 4 (four) hours.   metoprolol tartrate 25 mg/10 mL Susp Commonly known as: LOPRESSOR Place 5 mLs (12.5 mg total) into feeding tube 3 (three) times daily.   oxyCODONE 5 MG/5ML solution Commonly known as: ROXICODONE Place 5 mLs (5 mg total) into feeding tube every 6 (six) hours.   pantoprazole sodium 40 mg/20 mL Pack Commonly known as: PROTONIX Place 20 mLs (40 mg total) into feeding tube daily at 12 noon. Start taking on: April 30, 2020   QUEtiapine 200 MG tablet Commonly known as: SEROQUEL Place 1 tablet (200 mg total) into feeding tube 2 (two) times daily.       Follow-up Information    [facility]. Schedule an appointment as soon as possible for a visit.   Why: primary Care, pharmacy and  finacial councelling Contact information: [address] [place] [address] [phone]               Discharged Condition: stable   Time spent on discharge greater than 40 minutes.  Vital signs at Discharge. Temp:  [97.9 F (36.6 C)-102 F (38.9 C)] 97.9 F (36.6 C) (11/06 1245) Pulse Rate:  [100-131] 112 (11/06 1340) Resp:  [21-32] 32 (11/06 1340) BP: (92-146)/(62-92) 110/78 (11/06 1340) SpO2:  [90 %-97  %] 94 % (11/06 1340) FiO2 (%):  [40 %] 40 % (11/06 1340) Weight:  [82.8 kg] 82.8 kg (11/06 0409)  Time spent on discharge: >35 minutes   Tessie Fass MSN, AGACNP-BC [facility] Pulmonary/Critical Care Medicine 04/29/2020, 2:40 PM

## 2020-04-30 ENCOUNTER — Other Ambulatory Visit (HOSPITAL_COMMUNITY): Payer: 59

## 2020-04-30 ENCOUNTER — Encounter: Payer: Self-pay | Admitting: Internal Medicine

## 2020-04-30 DIAGNOSIS — U071 COVID-19: Secondary | ICD-10-CM | POA: Diagnosis present

## 2020-04-30 DIAGNOSIS — J9621 Acute and chronic respiratory failure with hypoxia: Secondary | ICD-10-CM | POA: Diagnosis present

## 2020-04-30 DIAGNOSIS — J939 Pneumothorax, unspecified: Secondary | ICD-10-CM | POA: Diagnosis present

## 2020-04-30 DIAGNOSIS — J1282 Pneumonia due to coronavirus disease 2019: Secondary | ICD-10-CM

## 2020-04-30 LAB — CBC
HCT: 31.6 % — ABNORMAL LOW (ref 39.0–52.0)
Hemoglobin: 9.9 g/dL — ABNORMAL LOW (ref 13.0–17.0)
MCH: 29.6 pg (ref 26.0–34.0)
MCHC: 31.3 g/dL (ref 30.0–36.0)
MCV: 94.6 fL (ref 80.0–100.0)
Platelets: 552 10*3/uL — ABNORMAL HIGH (ref 150–400)
RBC: 3.34 MIL/uL — ABNORMAL LOW (ref 4.22–5.81)
RDW: 14.1 % (ref 11.5–15.5)
WBC: 10.8 10*3/uL — ABNORMAL HIGH (ref 4.0–10.5)
nRBC: 0 % (ref 0.0–0.2)

## 2020-04-30 LAB — BASIC METABOLIC PANEL
Anion gap: 10 (ref 5–15)
BUN: 15 mg/dL (ref 6–20)
CO2: 32 mmol/L (ref 22–32)
Calcium: 8.6 mg/dL — ABNORMAL LOW (ref 8.9–10.3)
Chloride: 96 mmol/L — ABNORMAL LOW (ref 98–111)
Creatinine, Ser: 0.49 mg/dL — ABNORMAL LOW (ref 0.61–1.24)
GFR, Estimated: >60 mL/min (ref >60)
Glucose, Bld: 137 mg/dL — ABNORMAL HIGH (ref 70–99)
Potassium: 4.5 mmol/L (ref 3.5–5.1)
Sodium: 138 mmol/L (ref 135–145)

## 2020-04-30 NOTE — Consult Note (Signed)
Pulmonary Critical Care Medicine [facility] [place]  PULMONARY SERVICE  Date of Service: 04/30/2020  PULMONARY CRITICAL CARE CONSULT   Dennis Howe  YSA:[id number]  [name]: 02/16/1976   DOA: 04/29/2020  Referring Physician: Carron Curie, MD  HPI: Dennis Howe is a 44 y.o. male seen for follow up of Acute on Chronic Respiratory Failure.  Patient has multiple medical problems including pneumothorax respiratory failure presented to the hospital because of a diagnosis of shortness of breath was found to have COVID-19 started on steroids Actemra and remdesivir.  Patient intubated on 9 October had pneumothoraces requiring chest tube placement.  Patient was on prone position ventilation through October 15.  Problem continue to be with recurring pneumothoraces and required multiple chest tubes.  The patient underwent tracheostomy on October 26 because of failure to wean.  Now is transferred to our facility for further management currently is on the ventilator and full support.  Hospital course other than that had shown the right apical pneumothorax along with pneumomediastinum and subcutaneous emphysema.  Patient had also been on the list for ECMO however eventually was deemed that ECMO was not indicated.  Review of Systems:  ROS performed and is unremarkable other than noted above.  Past medical history: Obesity COVID-19 virus infection Pneumothorax Tracheostomy  Past surgical history: Multiple chest tubes for pneumothorax Tracheostomy  Family history: Noncontributory  Social history: Unknown tobacco alcohol or drug abuse  Medications: Reviewed on Rounds  Physical Exam:  Vitals: Temperature is 99.6 pulse 91 respiratory 23 blood pressure is 105/62 saturations 99%  Ventilator Settings on the ventilator and full support at this time and assist control mode  . General: Comfortable at this time . Eyes: Grossly normal lids, irises & conjunctiva . ENT: grossly  tongue is normal . Neck: no obvious mass . Cardiovascular: S1-S2 normal no gallop or rub . Respiratory: Coarse rhonchi are noted bilaterally . Abdomen: Soft nontender obese . Skin: no rash seen on limited exam . Musculoskeletal: not rigid . Psychiatric:unable to assess . Neurologic: no seizure no involuntary movements         Labs on Admission:  Basic Metabolic Panel: Recent Labs  Lab 04/25/20 0407 04/26/20 0251 04/27/20 0309 04/28/20 0204 04/29/20 0025  NA 139 137 139 138 137  K 4.0 4.2 4.1 4.0 4.2  CL 99 96* 96* 97* 97*  CO2 33* 33* 35* 33* 32  GLUCOSE 140* 144* 140* 137* 147*  BUN 12 14 14 15 15   CREATININE 0.47* 0.53* 0.43* 0.53* 0.47*  CALCIUM 7.9* 8.1* 8.2* 8.4* 8.4*  MG  --  2.3  --   --   --     No results for input(s): PHART, PCO2ART, PO2ART, HCO3, O2SAT in the last 168 hours.  Liver Function Tests: Recent Labs  Lab 04/26/20 0251 04/28/20 0204  AST 69* 47*  ALT 175* 161*  ALKPHOS 150* 152*  BILITOT 0.4 0.3  PROT 6.3* 6.7  ALBUMIN 1.9* 2.1*   No results for input(s): LIPASE, AMYLASE in the last 168 hours. Recent Labs  Lab 04/26/20 0251  AMMONIA 39*    CBC: Recent Labs  Lab 04/24/20 0454 04/25/20 0407 04/26/20 0251 04/27/20 0309 04/29/20 0025  WBC 8.1 8.2 7.7 9.5 8.6  HGB 10.1* 10.0* 9.8* 9.8* 9.7*  HCT 32.8* 32.1* 32.0* 31.2* 31.5*  MCV 95.3 95.3 95.0 95.7 95.7  PLT 495* 561* 567* 569* 540*    Cardiac Enzymes: No results for input(s): CKTOTAL, CKMB, CKMBINDEX, TROPONINI in the last 168  hours.  BNP (last 3 results) Recent Labs    03/30/20 0436  BNP 103.0*    ProBNP (last 3 results) No results for input(s): PROBNP in the last 8760 hours.   Radiological Exams on Admission: DG CHEST PORT 1 VIEW  Result Date: 04/29/2020 CLINICAL DATA:  ARDS due to COVID, pneumothorax, multiple chest tubes EXAM: PORTABLE CHEST 1 VIEW COMPARISON:  Portable exam 1338 hours compared to 0521 hours FINDINGS: BILATERAL thoracostomy tubes. Feeding tube  extends into stomach. Tracheostomy tube stable. Normal heart size mediastinal contours. Patchy BILATERAL pulmonary infiltrates consistent with multifocal pneumonia and COVID-19. Elevation of RIGHT diaphragm. No definite pneumothorax. IMPRESSION: No interval change. Electronically Signed   By: Ulyses Southward M.D.   On: 04/29/2020 20:08   DG CHEST PORT 1 VIEW  Result Date: 04/29/2020 CLINICAL DATA:  Follow-up pneumothorax, COVID-19 EXAM: PORTABLE CHEST 1 VIEW COMPARISON:  Chest radiograph from one day prior. FINDINGS: Stable position of 2 right and single left upper chest tubes. Tracheostomy tube tip overlies the tracheal air column at the level of the thoracic inlet. Enteric tube terminates over the distal stomach. Stable cardiomediastinal silhouette with top-normal heart size. No pneumothorax. No pleural effusion. Low lung volumes. Patchy opacities throughout both lungs, not appreciably changed. IMPRESSION: 1. No pneumothorax. Well-positioned support structures. 2. Low lung volumes with stable patchy opacities throughout both lungs, compatible with COVID-19 pneumonia. Electronically Signed   By: Delbert Phenix M.D.   On: 04/29/2020 08:09   DG CHEST PORT 1 VIEW  Result Date: 04/28/2020 CLINICAL DATA:  43 year old male with a history of pneumothorax, COVID positive EXAM: PORTABLE CHEST 1 VIEW COMPARISON:  Multiple prior most recent 04/26/2020 FINDINGS: Cardiomediastinal silhouette unchanged. Low lung volumes persist. 2 right-sided thoracostomy tube and 1 left-sided thoracostomy tube, unchanged in position. No visualized pneumothorax. Tracheostomy unchanged. Enteric feeding tube projects over the mediastinum and terminates out of the field of view, unchanged. Similar low lung volumes with mixed reticulonodular opacities. IMPRESSION: Similar appearance of low lung volumes and mixed reticulonodular opacity compatible with the given history. Unchanged position of 2 right-sided and single left thoracostomy tube with no  residual pneumothorax identified. Unchanged tracheostomy and enteric feeding tube. Electronically Signed   By: Gilmer Mor D.O.   On: 04/28/2020 16:11   DG Abd Portable 1V  Result Date: 04/30/2020 CLINICAL DATA:  NG tube placement EXAM: PORTABLE ABDOMEN - 1 VIEW COMPARISON:  04/02/2020 FINDINGS: The tip of the NG tube projects over the gastric antrum/pylorus. The tip is pointed distally. The visualized bowel gas pattern is unremarkable. IMPRESSION: Negative. Electronically Signed   By: Katherine Mantle M.D.   On: 04/30/2020 01:41   VAS [place] LOWER EXTREMITY VENOUS (DVT)  Result Date: 04/27/2020  Lower Venous DVT Study Indications: Fever.  Limitations: Ventilation. Comparison Study: No prior study Performing Technologist: Sherren Kerns RVS  Examination Guidelines: A complete evaluation includes B-mode imaging, spectral Doppler, color Doppler, and power Doppler as needed of all accessible portions of each vessel. Bilateral testing is considered an integral part of a complete examination. Limited examinations for reoccurring indications may be performed as noted. The reflux portion of the exam is performed with the patient in reverse Trendelenburg.  +---------+---------------+---------+-----------+----------+-------------------+ RIGHT    CompressibilityPhasicitySpontaneityPropertiesThrombus Aging      +---------+---------------+---------+-----------+----------+-------------------+ CFV      Full           Yes      Yes                                      +---------+---------------+---------+-----------+----------+-------------------+  SFJ      Full                                                             +---------+---------------+---------+-----------+----------+-------------------+ FV Prox  Full                                                             +---------+---------------+---------+-----------+----------+-------------------+ FV Mid   Full                                                              +---------+---------------+---------+-----------+----------+-------------------+ FV DistalFull                                                             +---------+---------------+---------+-----------+----------+-------------------+ PFV      Full                                                             +---------+---------------+---------+-----------+----------+-------------------+ POP      Full           Yes      Yes                                      +---------+---------------+---------+-----------+----------+-------------------+ PTV                                                   Patent by color and                                                       Doppler             +---------+---------------+---------+-----------+----------+-------------------+ PERO                                                  Patent by color and  Doppler             +---------+---------------+---------+-----------+----------+-------------------+   +---------+---------------+---------+-----------+----------+--------------+ LEFT     CompressibilityPhasicitySpontaneityPropertiesThrombus Aging +---------+---------------+---------+-----------+----------+--------------+ CFV      Full           Yes      Yes                                 +---------+---------------+---------+-----------+----------+--------------+ SFJ      Full                                                        +---------+---------------+---------+-----------+----------+--------------+ FV Prox  Full                                                        +---------+---------------+---------+-----------+----------+--------------+ FV Mid   Full                                                        +---------+---------------+---------+-----------+----------+--------------+ FV DistalFull                                                         +---------+---------------+---------+-----------+----------+--------------+ PFV      Full                                                        +---------+---------------+---------+-----------+----------+--------------+ POP      Full           Yes      Yes                                 +---------+---------------+---------+-----------+----------+--------------+ PTV      Full                                                        +---------+---------------+---------+-----------+----------+--------------+ PERO     Full                                                        +---------+---------------+---------+-----------+----------+--------------+     Summary: BILATERAL: - No evidence of deep vein thrombosis seen in the lower extremities, bilaterally. -   *See table(s) above for measurements and observations. Electronically  signed by Heath Lark on 04/27/2020 at 7:05:56 PM.    Final    VAS [place] UPPER EXTREMITY VENOUS DUPLEX  Result Date: 04/27/2020 UPPER VENOUS STUDY  Indications: fever of unknown origin, recent Covid-19 infection Limitations: Trach collar, ventilation. Comparison Study: No prior study Performing Technologist: Sherren Kerns RVS  Examination Guidelines: A complete evaluation includes B-mode imaging, spectral Doppler, color Doppler, and power Doppler as needed of all accessible portions of each vessel. Bilateral testing is considered an integral part of a complete examination. Limited examinations for reoccurring indications may be performed as noted.  Right Findings: +----------+------------+---------+-----------+----------+--------------+ RIGHT     CompressiblePhasicitySpontaneousProperties   Summary     +----------+------------+---------+-----------+----------+--------------+ IJV                                                 Not visualized  +----------+------------+---------+-----------+----------+--------------+ Subclavian               Yes       Yes                             +----------+------------+---------+-----------+----------+--------------+ Axillary      Full       Yes       Yes                             +----------+------------+---------+-----------+----------+--------------+ Brachial      Full       Yes       Yes                             +----------+------------+---------+-----------+----------+--------------+ Radial        Full                                                 +----------+------------+---------+-----------+----------+--------------+ Cephalic      Full                                                 +----------+------------+---------+-----------+----------+--------------+ Basilic       Full                                                 +----------+------------+---------+-----------+----------+--------------+  Left Findings: +----------+------------+---------+-----------+----------+-----------------+ LEFT      CompressiblePhasicitySpontaneousProperties     Summary      +----------+------------+---------+-----------+----------+-----------------+ IJV                                                  Not visualized   +----------+------------+---------+-----------+----------+-----------------+ Subclavian  Not visualized   +----------+------------+---------+-----------+----------+-----------------+ Axillary                 Yes       Yes                                +----------+------------+---------+-----------+----------+-----------------+ Brachial      Full       Yes       Yes                                +----------+------------+---------+-----------+----------+-----------------+ Cephalic      None                                  Acute (PICC line)  +----------+------------+---------+-----------+----------+-----------------+ Basilic       Full                                                    +----------+------------+---------+-----------+----------+-----------------+  Summary:  Right: Findings consistent with acute superficial vein thrombosis involving the right cephalic vein.  *See table(s) above for measurements and observations.  Diagnosing physician: Heath Lark Electronically signed by Heath Lark on 04/27/2020 at 7:05:40 PM.    Final     Assessment/Plan Active Problems:   Acute on chronic respiratory failure with hypoxia (HCC)   COVID-19 virus infection   Pneumonia due to COVID-19 virus   Bilateral pneumothoraces   1. Acute on chronic respiratory failure with hypoxia patient remains on the ventilator and full support at this time.  The patient RSB I mechanics will be checked for weaning readiness. 2. Bilateral pneumothoraces patient has bilateral chest tubes in place for the pneumothoraces.  Will need to continue to monitor for any recurrence. 3. COVID-19 virus infection in recovery we will continue with supportive care 4. COVID-19 pneumonia Latest chest film shows diffuse bilateral infiltrates still present we will continue with supportive care  I have personally seen and evaluated the patient, evaluated laboratory and imaging results, formulated the assessment and plan and placed orders. The Patient requires high complexity decision making with multiple systems involvement.  Case was discussed on Rounds with the Respiratory Therapy Director and the Respiratory staff Time Spent  Yevonne Pax, MD [facility] Pulmonary Critical Care Medicine Sleep Medicine

## 2020-05-01 ENCOUNTER — Other Ambulatory Visit (HOSPITAL_COMMUNITY): Payer: 59

## 2020-05-01 NOTE — Progress Notes (Signed)
Pulmonary Critical Care Medicine [facility] [place]   PULMONARY CRITICAL CARE SERVICE  PROGRESS NOTE  Date of Service: 05/01/2020  Dennis Howe  FHL:[id number]  [name]: 02/12/1976   DOA: 04/29/2020  Referring Physician: Carron Curie, MD  HPI: Dennis Howe is a 44 y.o. male seen for follow up of Acute on Chronic Respiratory Failure.  Patient is on assist control currently on 40% FiO2 good saturations are noted  Medications: Reviewed on Rounds  Physical Exam:  Vitals: Temperature is 97.6 pulse 110 respiratory 40 blood pressure is 122/70 saturations 96%  Ventilator Settings on assist control FiO2 40% tidal volume is 430 PEEP 5  . General: Comfortable at this time . Eyes: Grossly normal lids, irises & conjunctiva . ENT: grossly tongue is normal . Neck: no obvious mass . Cardiovascular: S1 S2 normal no gallop . Respiratory: No rhonchi very coarse breath sounds . Abdomen: soft . Skin: no rash seen on limited exam . Musculoskeletal: not rigid . Psychiatric:unable to assess . Neurologic: no seizure no involuntary movements         Lab Data:   Basic Metabolic Panel: Recent Labs  Lab 04/26/20 0251 04/27/20 0309 04/28/20 0204 04/29/20 0025 04/30/20 1033  NA 137 139 138 137 138  K 4.2 4.1 4.0 4.2 4.5  CL 96* 96* 97* 97* 96*  CO2 33* 35* 33* 32 32  GLUCOSE 144* 140* 137* 147* 137*  BUN 14 14 15 15 15   CREATININE 0.53* 0.43* 0.53* 0.47* 0.49*  CALCIUM 8.1* 8.2* 8.4* 8.4* 8.6*  MG 2.3  --   --   --   --     ABG: No results for input(s): PHART, PCO2ART, PO2ART, HCO3, O2SAT in the last 168 hours.  Liver Function Tests: Recent Labs  Lab 04/26/20 0251 04/28/20 0204  AST 69* 47*  ALT 175* 161*  ALKPHOS 150* 152*  BILITOT 0.4 0.3  PROT 6.3* 6.7  ALBUMIN 1.9* 2.1*   No results for input(s): LIPASE, AMYLASE in the last 168 hours. Recent Labs  Lab 04/26/20 0251  AMMONIA 39*    CBC: Recent Labs  Lab 04/25/20 0407 04/26/20 0251  04/27/20 0309 04/29/20 0025 04/30/20 1033  WBC 8.2 7.7 9.5 8.6 10.8*  HGB 10.0* 9.8* 9.8* 9.7* 9.9*  HCT 32.1* 32.0* 31.2* 31.5* 31.6*  MCV 95.3 95.0 95.7 95.7 94.6  PLT 561* 567* 569* 540* 552*    Cardiac Enzymes: No results for input(s): CKTOTAL, CKMB, CKMBINDEX, TROPONINI in the last 168 hours.  BNP (last 3 results) Recent Labs    03/30/20 0436  BNP 103.0*    ProBNP (last 3 results) No results for input(s): PROBNP in the last 8760 hours.  Radiological Exams: DG CHEST PORT 1 VIEW  Result Date: 05/01/2020 CLINICAL DATA:  Respiratory failure.  Chest tube EXAM: PORTABLE CHEST 1 VIEW COMPARISON:  Two days ago FINDINGS: Bilateral chest tubes in place, numbering 2 on the right. No visible pneumothorax or pleural fluid. Tracheostomy and feeding tubes in good position. Stable low volume chest with bilateral coarse infiltrates. Normal heart size. IMPRESSION: Stable hardware positioning and pulmonary infiltrates. No visible pneumothorax. Electronically Signed   By: 13/01/2020 M.D.   On: 05/01/2020 05:51   DG CHEST PORT 1 VIEW  Result Date: 04/29/2020 CLINICAL DATA:  ARDS due to COVID, pneumothorax, multiple chest tubes EXAM: PORTABLE CHEST 1 VIEW COMPARISON:  Portable exam 1338 hours compared to 0521 hours FINDINGS: BILATERAL thoracostomy tubes. Feeding tube extends into stomach. Tracheostomy tube stable. Normal  heart size mediastinal contours. Patchy BILATERAL pulmonary infiltrates consistent with multifocal pneumonia and COVID-19. Elevation of RIGHT diaphragm. No definite pneumothorax. IMPRESSION: No interval change. Electronically Signed   By: Ulyses Southward M.D.   On: 04/29/2020 20:08   DG Abd Portable 1V  Result Date: 04/30/2020 CLINICAL DATA:  NG tube placement EXAM: PORTABLE ABDOMEN - 1 VIEW COMPARISON:  04/02/2020 FINDINGS: The tip of the NG tube projects over the gastric antrum/pylorus. The tip is pointed distally. The visualized bowel gas pattern is unremarkable. IMPRESSION:  Negative. Electronically Signed   By: Katherine Mantle M.D.   On: 04/30/2020 01:41    Assessment/Plan Active Problems:   Acute on chronic respiratory failure with hypoxia (HCC)   COVID-19 virus infection   Pneumonia due to COVID-19 virus   Bilateral pneumothoraces   1. Acute on chronic respiratory failure with hypoxia we will continue with wean protocol.  Patient has been tolerating it well so far should be doing pressure support up to 2 to 4 hours 2. COVID-19 virus infection in recovery we will continue with supportive care. 3. Pneumonia due to COVID-19 slow improvement residual infiltrates still noted 4. Bilateral pneumothoraces resolved   I have personally seen and evaluated the patient, evaluated laboratory and imaging results, formulated the assessment and plan and placed orders. The Patient requires high complexity decision making with multiple systems involvement.  Rounds were done with the Respiratory Therapy Director and Staff therapists and discussed with nursing staff also.  Yevonne Pax, MD [facility] Pulmonary Critical Care Medicine Sleep Medicine

## 2020-05-02 ENCOUNTER — Other Ambulatory Visit (HOSPITAL_COMMUNITY): Payer: 59

## 2020-05-02 NOTE — Consult Note (Signed)
Chief Complaint: Patient was seen in consultation today for percutaneous gastric tube placement at the request of Dr Ardeth Sportsman   Supervising Physician: Malachy Moan  Patient Status: [facility]  History of Present Illness: Dennis Howe is a 44 y.o. male   Covid + 03/2020 Intubated 10/9 Bilateral PTX and chest tubes 04/02/20  Trach/vent Long term care Now in [facility] Dysphagia Deconditioning  Chest tubes removed 05/01/20  Request for percutaneous gastric tube placement Dr Archer Asa has reviewed imaging and approves procedure   Past Medical History:  Diagnosis Date  . Acute on chronic respiratory failure with hypoxia (HCC)   . Bilateral pneumothoraces   . BMI 30.0-30.9,adult   . COVID-19 virus infection   . Pneumonia due to COVID-19 virus     No past surgical history on file.  Allergies: Patient has no known allergies.  Medications: Prior to Admission medications   Medication Sig Start Date End Date Taking? Authorizing Provider  cefdinir (OMNICEF) 250 MG/5ML suspension Place 6 mLs (300 mg total) into feeding tube 2 (two) times daily. 04/29/20   Bowser, Kaylyn Layer, NP  clonazePAM (KLONOPIN) 1 MG disintegrating tablet Place 1 tablet (1 mg total) into feeding tube 2 (two) times daily. 04/29/20   Bowser, Kaylyn Layer, NP  enoxaparin (LOVENOX) 40 MG/0.4ML injection Inject 0.4 mLs (40 mg total) into the skin daily. 04/29/20   Bowser, Kaylyn Layer, NP  fiber (NUTRISOURCE FIBER) PACK packet Place 1 packet into feeding tube 2 (two) times daily. 04/29/20   Bowser, Kaylyn Layer, NP  insulin aspart (NOVOLOG) 100 UNIT/ML injection Inject 0-15 Units into the skin every 4 (four) hours. 04/29/20   Bowser, Kaylyn Layer, NP  metoprolol tartrate (LOPRESSOR) 25 mg/10 mL SUSP Place 5 mLs (12.5 mg total) into feeding tube 3 (three) times daily. 04/29/20   Bowser, Kaylyn Layer, NP  Nutritional Supplements (FEEDING SUPPLEMENT, PROSOURCE TF,) liquid Place 45 mLs into feeding tube 5 (five) times daily. 04/29/20    Bowser, Kaylyn Layer, NP  Nutritional Supplements (FEEDING SUPPLEMENT, VITAL 1.5 CAL,) LIQD Place 1,000 mLs into feeding tube continuous. 04/29/20   Bowser, Kaylyn Layer, NP  oxyCODONE (ROXICODONE) 5 MG/5ML solution Place 5 mLs (5 mg total) into feeding tube every 6 (six) hours. 04/29/20   Bowser, Kaylyn Layer, NP  pantoprazole sodium (PROTONIX) 40 mg/20 mL PACK Place 20 mLs (40 mg total) into feeding tube daily at 12 noon. 04/30/20   Bowser, Kaylyn Layer, NP  QUEtiapine (SEROQUEL) 200 MG tablet Place 1 tablet (200 mg total) into feeding tube 2 (two) times daily. 04/29/20   Lanier Clam, NP     No family history on file.  Social History   Socioeconomic History  . Marital status: Married    Spouse name: Not on file  . Number of children: Not on file  . Years of education: Not on file  . Highest education level: Not on file  Occupational History  . Not on file  Tobacco Use  . Smoking status: Not on file  Substance and Sexual Activity  . Alcohol use: Not on file  . Drug use: Not on file  . Sexual activity: Not on file  Other Topics Concern  . Not on file  Social History Narrative  . Not on file   Social Determinants of Health   Financial Resource Strain:   . Difficulty of Paying Living Expenses: Not on file  Food Insecurity:   . Worried About Programme researcher, broadcasting/film/video in the Last Year: Not on  file  . Ran Out of Food in the Last Year: Not on file  Transportation Needs:   . Lack of Transportation (Medical): Not on file  . Lack of Transportation (Non-Medical): Not on file  Physical Activity:   . Days of Exercise per Week: Not on file  . Minutes of Exercise per Session: Not on file  Stress:   . Feeling of Stress : Not on file  Social Connections:   . Frequency of Communication with Friends and Family: Not on file  . Frequency of Social Gatherings with Friends and Family: Not on file  . Attends Religious Services: Not on file  . Active Member of Clubs or Organizations: Not on file  . Attends Tax inspector Meetings: Not on file  . Marital Status: Not on file    Review of Systems: A 12 point ROS discussed and pertinent positives are indicated in the HPI above.  All other systems are negative.   Vital Signs: There were no vitals taken for this visit.  Physical Exam Cardiovascular:     Rate and Rhythm: Regular rhythm.     Heart sounds: Normal heart sounds.  Pulmonary:     Breath sounds: Wheezing present.  Neurological:     Mental Status: He is oriented to person, place, and time.  Psychiatric:        Behavior: Behavior normal.     Comments: Wife at bedside Both speak English     Imaging: CT ABDOMEN WO CONTRAST  Result Date: 05/02/2020 CLINICAL DATA:  Feeding tube placement assessment. EXAM: CT ABDOMEN WITHOUT CONTRAST TECHNIQUE: Multidetector CT imaging of the abdomen was performed following the standard protocol without IV contrast. COMPARISON:  None. FINDINGS: Lower chest: Bilateral chest tubes in place. No definite pneumothorax. Patchy bilateral lung infiltrates with cavitary process in the right upper lobe. No pleural effusions. The heart is normal in size. No pericardial effusion. There is an NG tube coursing down the esophagus and into the stomach. Hepatobiliary: No hepatic lesions or intrahepatic biliary dilatation. The gallbladder is grossly normal. No common bile duct dilatation. Pancreas: No mass, inflammation or ductal dilatation. Spleen: Normal size. No focal lesions. Adrenals/Urinary Tract: The adrenal glands and kidneys are unremarkable. Stomach/Bowel: The stomach, duodenum, visualized small bowel and visualized colon are grossly normal. Vascular/Lymphatic: The aorta is normal in caliber. No atheroscerlotic calcifications. No mesenteric of retroperitoneal mass or adenopathy. Small scattered lymph nodes are noted. Other: No ascites or abdominal wall hernia. Musculoskeletal: No significant bony findings. IMPRESSION: 1. Bilateral chest tubes in place. No definite  pneumothorax. 2. Patchy bilateral lung infiltrates with cavitary process in the right upper lobe. 3. No acute abdominal findings, mass lesions or adenopathy. Electronically Signed   By: Rudie Meyer M.D.   On: 05/02/2020 06:51   DG Chest 1 View  Result Date: 04/11/2020 CLINICAL DATA:  History of respiratory failure with chest tubes in place, history of pneumothorax. EXAM: CHEST  1 VIEW COMPARISON:  04/11/2020 FINDINGS: Endotracheal tube terminates between the clavicles in similar position to the prior exam. The image rotated to the RIGHT. Accounting for this the RIGHT IJ central venous catheter is stable approximately at the caval to atrial junction with respect to the tip. LEFT-sided chest tube in place. No visible pneumothorax in the LEFT chest. Two RIGHT-sided chest tubes remain in place. There is a small to moderate amount of subcutaneous emphysema along the RIGHT chest wall. Pneumothorax on the RIGHT has resolved since the previous study. Diffuse interstitial and airspace disease greatest  in the LEFT chest with similar appearance accounting for re-expansion of the lung in the RIGHT chest. Feeding tube courses through and off the field of the radiograph, passing through the stomach, tip off the field of view. No acute skeletal process to the extent evaluated. IMPRESSION: 1. Interval resolution of pneumothorax on the RIGHT, no visible pleural line on current evaluation. Two RIGHT-sided chest tubes remain in place. 2. Stable appearance of diffuse interstitial and airspace disease greatest in the LEFT chest. 3. Stable appearance of additional support apparatus as described. Electronically Signed   By: Donzetta Kohut M.D.   On: 04/11/2020 08:18   DG Chest Port 1 View  Result Date: 05/02/2020 CLINICAL DATA:  Follow-up pneumothorax. EXAM: PORTABLE CHEST 1 VIEW COMPARISON:  Multiple recent chest films. The most recent is 05/01/2020 FINDINGS: Bilateral chest tubes are stable. No definite pneumothorax. The  tracheostomy tube is stable. The feeding tube is stable. Slight overall improved lung aeration when compared to yesterday's film. Persistent patchy infiltrates. IMPRESSION: Bilateral chest tubes without definite pneumothorax. Persistent patchy infiltrates but overall slight improved aeration. Electronically Signed   By: Rudie Meyer M.D.   On: 05/02/2020 06:45   DG CHEST PORT 1 VIEW  Result Date: 05/01/2020 CLINICAL DATA:  Chest tubes placed for pneumothorax EXAM: PORTABLE CHEST 1 VIEW COMPARISON:  May 01, 2020 study obtained earlier in the day FINDINGS: A chest tube has been removed from the right side. There remains a chest tube on each side currently. No pneumothorax. Tracheostomy catheter tip is 4.5 cm above the carina. Feeding tube tip is below the diaphragm. There is patchy airspace opacity throughout the lungs bilaterally, stable. No new opacity evident. Heart size and pulmonary vascularity normal. No adenopathy. No bone lesions. IMPRESSION: Tube positions as described without pneumothorax. Patchy airspace opacity likely representing multifocal pneumonia, stable. Stable cardiac silhouette. Electronically Signed   By: Bretta Bang III M.D.   On: 05/01/2020 13:26   DG CHEST PORT 1 VIEW  Result Date: 05/01/2020 CLINICAL DATA:  Respiratory failure.  Chest tube EXAM: PORTABLE CHEST 1 VIEW COMPARISON:  Two days ago FINDINGS: Bilateral chest tubes in place, numbering 2 on the right. No visible pneumothorax or pleural fluid. Tracheostomy and feeding tubes in good position. Stable low volume chest with bilateral coarse infiltrates. Normal heart size. IMPRESSION: Stable hardware positioning and pulmonary infiltrates. No visible pneumothorax. Electronically Signed   By: Marnee Spring M.D.   On: 05/01/2020 05:51   DG CHEST PORT 1 VIEW  Result Date: 04/29/2020 CLINICAL DATA:  ARDS due to COVID, pneumothorax, multiple chest tubes EXAM: PORTABLE CHEST 1 VIEW COMPARISON:  Portable exam 1338 hours  compared to 0521 hours FINDINGS: BILATERAL thoracostomy tubes. Feeding tube extends into stomach. Tracheostomy tube stable. Normal heart size mediastinal contours. Patchy BILATERAL pulmonary infiltrates consistent with multifocal pneumonia and COVID-19. Elevation of RIGHT diaphragm. No definite pneumothorax. IMPRESSION: No interval change. Electronically Signed   By: Ulyses Southward M.D.   On: 04/29/2020 20:08   DG CHEST PORT 1 VIEW  Result Date: 04/29/2020 CLINICAL DATA:  Follow-up pneumothorax, COVID-19 EXAM: PORTABLE CHEST 1 VIEW COMPARISON:  Chest radiograph from one day prior. FINDINGS: Stable position of 2 right and single left upper chest tubes. Tracheostomy tube tip overlies the tracheal air column at the level of the thoracic inlet. Enteric tube terminates over the distal stomach. Stable cardiomediastinal silhouette with top-normal heart size. No pneumothorax. No pleural effusion. Low lung volumes. Patchy opacities throughout both lungs, not appreciably changed. IMPRESSION: 1. No pneumothorax.  Well-positioned support structures. 2. Low lung volumes with stable patchy opacities throughout both lungs, compatible with COVID-19 pneumonia. Electronically Signed   By: Delbert Phenix M.D.   On: 04/29/2020 08:09   DG CHEST PORT 1 VIEW  Result Date: 04/28/2020 CLINICAL DATA:  44 year old male with a history of pneumothorax, COVID positive EXAM: PORTABLE CHEST 1 VIEW COMPARISON:  Multiple prior most recent 04/26/2020 FINDINGS: Cardiomediastinal silhouette unchanged. Low lung volumes persist. 2 right-sided thoracostomy tube and 1 left-sided thoracostomy tube, unchanged in position. No visualized pneumothorax. Tracheostomy unchanged. Enteric feeding tube projects over the mediastinum and terminates out of the field of view, unchanged. Similar low lung volumes with mixed reticulonodular opacities. IMPRESSION: Similar appearance of low lung volumes and mixed reticulonodular opacity compatible with the given history.  Unchanged position of 2 right-sided and single left thoracostomy tube with no residual pneumothorax identified. Unchanged tracheostomy and enteric feeding tube. Electronically Signed   By: Gilmer Mor D.O.   On: 04/28/2020 16:11   DG CHEST PORT 1 VIEW  Result Date: 04/26/2020 CLINICAL DATA:  44 year old male with history of respiratory failure following COVID-19 infection. ARDS. EXAM: PORTABLE CHEST 1 VIEW COMPARISON:  Multiple priors, most recently 04/20/2020. FINDINGS: Tracheostomy tube in position with tip 4.5 cm above the carina. Bilateral chest tubes are noted. Two right-sided chest tubes and 1 left-sided chest tube appear stable in position with tips and side ports projecting over the thorax. There is a right upper extremity PICC with tip terminating in the superior cavoatrial junction. Small bore feeding tube is noted with tip extending below the lower margin of the image. Lung volumes are low. There are widespread ill-defined opacities and areas of diffuse interstitial prominence throughout all aspects of the lungs bilaterally, similar to the recent prior examination. No definite pleural effusions. Pulmonary vasculature is obscured. Heart size is upper limits of normal. Upper mediastinal contours are distorted by patient positioning. IMPRESSION: 1. Support apparatus, as above. 2. Radiographic appearance of the chest is essentially unchanged, compatible with resolving multilobar bilateral pneumonia, and probable ARDS, as above. Electronically Signed   By: Trudie Reed M.D.   On: 04/26/2020 07:37   DG CHEST PORT 1 VIEW  Result Date: 04/20/2020 CLINICAL DATA:  Abnormal breathing, COVID-19 positive. EXAM: PORTABLE CHEST 1 VIEW COMPARISON:  April 19, 2020. FINDINGS: Tracheostomy and feeding tubes are unchanged in position. Right internal jugular catheter is unchanged. Bilateral chest tubes are unchanged. No pneumothorax is noted. Multiple airspace opacities are noted bilaterally consistent with  multifocal pneumonia. Bony thorax is unremarkable. IMPRESSION: Stable support apparatus. Stable bilateral chest tubes without evidence of pneumothorax. Stable bilateral airspace opacities are noted consistent with multifocal pneumonia. Electronically Signed   By: Lupita Raider M.D.   On: 04/20/2020 13:18   DG CHEST PORT 1 VIEW  Result Date: 04/19/2020 CLINICAL DATA:  Acute respiratory failure. EXAM: PORTABLE CHEST 1 VIEW COMPARISON:  April 18, 2020. FINDINGS: Stable position of tracheostomy and feeding tube. Stable bilateral chest tubes are noted, with stable right apical pneumothorax. Stable bilateral lung opacities are noted concerning for multifocal pneumonia. Bony thorax is unremarkable. Stable right internal jugular catheter. IMPRESSION: Stable support apparatus. Stable bilateral chest tubes with stable right apical pneumothorax. Stable bilateral lung opacities concerning for multifocal pneumonia. Electronically Signed   By: Lupita Raider M.D.   On: 04/19/2020 10:13   DG CHEST PORT 1 VIEW  Result Date: 04/18/2020 CLINICAL DATA:  44 year old male with pneumothorax. EXAM: PORTABLE CHEST 1 VIEW COMPARISON:  Earlier radiograph dated 04/18/2020.  FINDINGS: Endotracheal tube above the carina and feeding tube extend below the diaphragm with tip beyond the inferior margin of the image. Right IJ central venous line in similar position. Bilateral chest tubes are noted similar to prior radiograph. No significant interval change in the size of the right pneumothorax measuring approximately 2.3 cm in thickness. Diffuse pulmonary opacities as seen previously. The cardiac silhouette is within limits. No acute osseous pathology. IMPRESSION: No significant interval change in the size of the right pneumothorax or pulmonary opacities. Continued follow-up recommended. Electronically Signed   By: Elgie Collard M.D.   On: 04/18/2020 20:22   DG CHEST PORT 1 VIEW  Result Date: 04/18/2020 CLINICAL DATA:   Respiratory distress COVID EXAM: PORTABLE CHEST 1 VIEW COMPARISON:  04/15/2020, 04/14/2020, 04/13/2020, 04/12/2020 FINDINGS: Tracheostomy tube has been placed, the tip is about 4.3 cm superior to the carina. Bilateral chest tubes remain in place. Stable cardiomediastinal silhouette. Extensive bilateral irregular airspace consolidations with some worsening in the left upper lobe and right base. Increased right apicolateral pneumothorax, demonstrating 16 mm pleural-parenchymal separation. Right-sided central venous catheter tip over the SVC. Esophageal tube tip below the diaphragm but incompletely visualized. IMPRESSION: 1. Increased right apicolateral pneumothorax with 16 mm pleural-parenchymal separation. Bilateral chest tubes remain in place. 2. Extensive bilateral irregular airspace disease with some worsening in the left upper lobe and right base. 3. Interval placement of tracheostomy tube. These results will be called to the ordering clinician or representative by the Radiologist Assistant, and communication documented in the PACS or [employer]. Electronically Signed   By: Jasmine Pang M.D.   On: 04/18/2020 16:38   DG Chest Port 1 View  Result Date: 04/15/2020 CLINICAL DATA:  COVID-19 positivity with hypoxia and respiratory difficulty EXAM: PORTABLE CHEST 1 VIEW COMPARISON:  04/14/2020 FINDINGS: Endotracheal tube, feeding catheter, right jugular central line and thoracostomy catheters are again seen and stable. No sizable pneumothorax is noted. Patchy airspace opacities are noted bilaterally consistent with the given clinical history. No bony abnormality is seen. IMPRESSION: No evidence of pneumothorax. Stable bilateral airspace opacities consistent with given clinical history. Tubes and lines are stable in appearance. Electronically Signed   By: Alcide Clever M.D.   On: 04/15/2020 22:42   DG Chest Port 1 View  Result Date: 04/14/2020 CLINICAL DATA:  Pneumothorax EXAM: PORTABLE CHEST 1 VIEW  COMPARISON:  04/13/2020 FINDINGS: Endotracheal tube, enteric tube, 2 right chest tubes, single left chest tube, and right central venous catheter are unchanged in position. Shallow inspiration. Cardiac enlargement. Bilateral pulmonary infiltrates. Minimal residual right apical pneumothorax. Subcutaneous emphysema in the right chest wall. No significant changes since prior study. IMPRESSION: Stable chest. Minimal residual right apical pneumothorax. Bilateral pulmonary infiltrates. Electronically Signed   By: Burman Nieves M.D.   On: 04/14/2020 05:40   DG Chest Port 1 View  Result Date: 04/13/2020 CLINICAL DATA:  Chest tube in place EXAM: PORTABLE CHEST 1 VIEW COMPARISON:  04/12/2020 FINDINGS: Endotracheal tube, enteric tube, right central venous catheter, 2 right chest tubes, and single left chest tube remain unchanged in position. Small residual right apical pneumothorax without change. Subcutaneous emphysema in the right chest wall and right neck may be increasing. Cardiac enlargement. Diffuse bilateral pulmonary infiltrates. IMPRESSION: 1. Stable small residual right apical pneumothorax with chest tubes in place. 2. Increasing subcutaneous emphysema. 3. Diffuse bilateral pulmonary infiltrates. Electronically Signed   By: Burman Nieves M.D.   On: 04/13/2020 04:43   DG CHEST PORT 1 VIEW  Result Date: 04/12/2020 CLINICAL DATA:  Follow-up coronavirus pneumonia EXAM: PORTABLE CHEST 1 VIEW COMPARISON:  Earlier same day FINDINGS: Endotracheal tube tip 4 cm above the carina. Soft feeding tube enters the abdomen. Right internal jugular central line tip at the SVC RA junction. 1 chest tube on the left and 2 chest tubes on the right. Tiny amount of pleural air at the right apex. No significant pneumothorax. Widespread bilateral pulmonary infiltrates persist, similar to earlier today. IMPRESSION: 1. Lines and tubes well positioned. Tiny amount of pleural air at the right apex. No significant pneumothorax on  either side. 2. Persistent widespread bilateral pulmonary infiltrates. Electronically Signed   By: Paulina Fusi M.D.   On: 04/12/2020 10:21   DG Chest Port 1 View  Result Date: 04/12/2020 CLINICAL DATA:  COVID pneumonia EXAM: PORTABLE CHEST 1 VIEW COMPARISON:  04/11/2020 FINDINGS: Endotracheal tube, enteric tube, right central venous catheter, and 2 right and 1 left chest tube are unchanged in position. Mild cardiac enlargement. Bilateral pulmonary infiltrates. Subcutaneous emphysema in the right chest wall. No change since prior study. IMPRESSION: Bilateral pulmonary infiltrates unchanged. Stable support lines and tubes. Electronically Signed   By: Burman Nieves M.D.   On: 04/12/2020 04:27   DG Chest Port 1 View  Result Date: 04/11/2020 CLINICAL DATA:  Respiratory failure.  COVID-19. EXAM: PORTABLE CHEST 1 VIEW COMPARISON:  04/10/2020 04/09/2020. FINDINGS: Endotracheal tube, feeding tube, right IJ line, 2 right chest tubes, left chest tube in stable position. Diffuse severe bilateral pulmonary infiltrates are again noted. Recurrence of right-sided pneumothorax noted. Pneumothorax is moderate on today's exam. Subcutaneous emphysema right chest wall again noted. IMPRESSION: 1. Lines and tubes including bilateral chest tubes in stable position. 2. Recurrence of right-sided pneumothorax noted. Pneumothorax is moderate sized. Subcutaneous emphysema right chest wall again noted. 3. Diffuse severe bilateral pulmonary infiltrates are again noted. Critical Value/emergent results were called by telephone at the time of interpretation on 04/11/2020 at 5:38 am to nurse Jonny Ruiz, who verbally acknowledged these results. Electronically Signed   By: Maisie Fus  Register   On: 04/11/2020 05:41   DG CHEST PORT 1 VIEW  Result Date: 04/10/2020 CLINICAL DATA:  Pneumonia.  COVID positive. EXAM: PORTABLE CHEST 1 VIEW COMPARISON:  04/09/2020 FINDINGS: Endotracheal tube, enteric tube, right central venous catheter, and bilateral  chest tubes are unchanged in position. Improved right pneumothorax since previous study. Subcutaneous emphysema in the chest wall. Infiltrates in both lungs are similar to prior study. IMPRESSION: Interval improvement of right pneumothorax. Otherwise no significant change. Bilateral pulmonary infiltrates. Electronically Signed   By: Burman Nieves M.D.   On: 04/10/2020 05:55   DG CHEST PORT 1 VIEW  Result Date: 04/09/2020 CLINICAL DATA:  Follow-up pneumothorax EXAM: PORTABLE CHEST 1 VIEW COMPARISON:  Film from earlier in the same day. FINDINGS: Cardiac shadow is stable. Bilateral thoracostomy catheters are again seen and stable. Left lung remains well aerated with diffuse airspace opacity stable from the prior study. Persistent right-sided pneumothorax is seen although slightly less prominent when compared with the prior exam. Airspace disease is noted within the right lung as well. Right jugular central line and feeding catheter are seen. No acute bony abnormality is noted. IMPRESSION: Diffuse bilateral airspace disease stable from the prior exam. Slight decrease in right-sided pneumothorax when compared with the prior study. The remainder of the exam is stable from the prior study. Electronically Signed   By: Alcide Clever M.D.   On: 04/09/2020 23:58   DG Chest Port 1 View  Result Date: 04/09/2020 CLINICAL DATA:  44 year old  male with pneumothorax. EXAM: PORTABLE CHEST 1 VIEW COMPARISON:  Earlier radiograph dated 04/09/2020. FINDINGS: Interval removal of the endotracheal tube. Bilateral chest tubes and feeding tube as well as right IJ central venous line in similar position. There has been interval increase in the size of the right pneumothorax compared to the prior radiograph. The right pneumothorax now measures up to 3.7 cm in thickness to the apical pleural surface. There is slight shift of the mediastinum into the left hemithorax. Diffuse pulmonary opacities as seen previously. Right chest wall soft  tissue emphysema. IMPRESSION: 1. Interval increase in the size of the right pneumothorax with slight shift of the mediastinum into the left hemithorax. 2. Interval removal of the endotracheal tube. These results were called by telephone at the time of interpretation on 04/09/2020 at 10:26 pm to provider Lrannan, who verbally acknowledged these results. Electronically Signed   By: Elgie Collard M.D.   On: 04/09/2020 22:29   DG CHEST PORT 1 VIEW  Result Date: 04/09/2020 CLINICAL DATA:  Pneumothorax EXAM: PORTABLE CHEST 1 VIEW COMPARISON:  04/08/2020 chest radiograph and prior. FINDINGS: ETT tip approximately 3.1 cm above the carina. Enteric tube terminates outside field of view. Dual right and single left chest tubes. Right IJ CVC tip overlies the superior cavoatrial junction. Small right apicolateral pneumothorax, new since prior exam. No left pneumothorax. Trace left pleural effusion. Patchy and confluent bilateral pulmonary opacities are more conspicuous than prior exam. Partially obscured cardiomediastinal silhouette. IMPRESSION: New small right apicolateral pneumothorax. Indwelling dual right and single left chest tubes. Increased bilateral pulmonary opacities. Trace left pleural effusion. These results will be called to the ordering clinician or representative by the Radiologist Assistant, and communication documented in the PACS or [employer]. Electronically Signed   By: Stana Bunting M.D.   On: 04/09/2020 10:05   DG CHEST PORT 1 VIEW  Result Date: 04/08/2020 CLINICAL DATA:  Acute respiratory distress syndrome. EXAM: PORTABLE CHEST 1 VIEW COMPARISON:  04/06/2020 chest radiograph and prior. FINDINGS: Endotracheal tube tip terminates 3.1 cm above the carina. Enteric tube courses inferiorly terminating outside field of view. Dual right and single left indwelling chest tubes. Right IJ CVC tip overlies the superior cavoatrial junction. Right body wall subcutaneous emphysema, likely  postprocedural. Interstitial prominence and bilateral pulmonary opacities are unchanged. Cardiomediastinal silhouette within normal limits. No pneumothorax or pleural effusion. IMPRESSION: Bilateral pulmonary opacities, unchanged. Support devices detailed above.  No pneumothorax. Electronically Signed   By: Stana Bunting M.D.   On: 04/08/2020 08:08   DG Chest Port 1 View  Result Date: 04/06/2020 CLINICAL DATA:  Acute respiratory failure.  ETT placement. EXAM: PORTABLE CHEST 1 VIEW COMPARISON:  04/05/2020 FINDINGS: 0548 hours. Endotracheal tube tip is 3.4 cm above the base of the carina. The NG tube passes into the stomach although the distal tip position is not included on the film. Bilateral chest tubes remain in place. Right IJ central line tip overlies expected location of the mid to distal SVC. No evidence for pneumothorax or substantial pleural effusion diffuse interstitial and patchy basilar airspace disease is similar to prior. Telemetry leads overlie the chest. Subcutaneous emphysema again noted right chest wall. IMPRESSION: No substantial interval change. Diffuse interstitial and patchy basilar airspace opacities, similar to prior. Electronically Signed   By: Kennith Center M.D.   On: 04/06/2020 06:18   DG CHEST PORT 1 VIEW  Result Date: 04/05/2020 CLINICAL DATA:  Acute respiratory failure secondary to COVID-19 infection EXAM: PORTABLE CHEST 1 VIEW COMPARISON:  Film from  the previous day. FINDINGS: Endotracheal tube, gastric catheter and bilateral thoracostomy catheters are again seen. No pneumothorax is noted. Right jugular central line is seen. Stable opacities are again identified somewhat accentuated by poor inspiratory effort. No bony abnormality is seen. IMPRESSION: Tubes and lines as described without evidence of pneumothorax. Patchy opacities in the bases accentuated by poor inspiratory effort. Electronically Signed   By: Alcide Clever M.D.   On: 04/05/2020 17:38   DG Chest Port 1  View  Result Date: 04/04/2020 CLINICAL DATA:  Check endotracheal tube following supine positioning EXAM: PORTABLE CHEST 1 VIEW COMPARISON:  04/03/2020 FINDINGS: Cardiac shadow is stable. Endotracheal tube is noted in satisfactory position. Gastric catheter and bilateral thoracostomy tubes are again noted. No definitive pneumothorax is seen. Subcutaneous emphysema is noted on the right. Right jugular central line is noted as well. Lungs are well aerated bilaterally. Patchy airspace opacity is again noted in the left lung but improved. IMPRESSION: No definitive pneumothorax is noted. Tubes and lines are seen in satisfactory position stable from the previous exam. Patchy opacities in the left lung which have improved somewhat in the interval from the prior exam. Electronically Signed   By: Alcide Clever M.D.   On: 04/04/2020 19:16   DG CHEST PORT 1 VIEW  Result Date: 04/03/2020 CLINICAL DATA:  Chest tube in place.  COVID-19. EXAM: PORTABLE CHEST 1 VIEW COMPARISON:  04/02/2020 FINDINGS: The patient is mildly rotated to the left. Endotracheal tube terminates near the inferior margin of the clavicular heads, well above the carina. The more superiorly located right chest tube has been advanced and now terminates over the right lung apex. The second right chest tube and the left chest tube are unchanged. No definite pneumothorax is identified. Extensive subcutaneous emphysema remains throughout the chest wall and lower neck bilaterally, improved in the right chest wall since the prior study. There are persistent asymmetric patchy airspace opacities in the left mid and lower lung. No sizable pleural effusion is identified. IMPRESSION: 1. Interval advancement of one of the right chest tube. No definite pneumothorax. 2. Extensive subcutaneous emphysema, decreased in the right chest wall. 3. Persistent asymmetric left lung airspace disease. Electronically Signed   By: Sebastian Ache M.D.   On: 04/03/2020 04:46   DG CHEST  PORT 1 VIEW  Result Date: 04/02/2020 CLINICAL DATA:  Respiratory failure, pneumothorax EXAM: PORTABLE CHEST 1 VIEW COMPARISON:  7:46 p.m. FINDINGS: Since the prior examination, a second chest tube has been placed within the a right mid lung zone and large right pneumothorax noted on prior examination has been completely evacuated. Pre-existing right chest tube crosses the midline anteriorly. Right internal jugular central venous catheter with its tip within the superior vena cava, nasogastric tube with its tip overlying the expected proximal body of the stomachl, endotracheal tube 3.7 cm above the carina, and left large bore chest tube are all unchanged. No pneumothorax on the left. No pleural effusion. Focal consolidation within the left mid lung zone peripherally appears similar to multiple prior examinations. Extensive subcutaneous gas within the chest wall has progressed in the interval since prior examination. IMPRESSION: Interval right chest tube placement with evacuation of right pneumothorax. Increasing diffuse body wall subcutaneous gas. Otherwise stable support lines and tubes. Stable left peripheral mid lung zone consolidation, likely infectious or inflammatory. Electronically Signed   By: Helyn Numbers MD   On: 04/02/2020 23:09   DG Chest Port 1 View  Result Date: 04/02/2020 CLINICAL DATA:  44 year old male with desaturation. EXAM:  PORTABLE CHEST 1 VIEW COMPARISON:  Chest radiograph dated 04/02/2020. FINDINGS: There is a large right pneumothorax (greater than 50%). There is complete collapse of the right lung with shift of the mediastinum into the left hemithorax concerning for a degree of tension. The left chest tube is in similar position. The tip of the right chest tube is not to the left of the midline. Endotracheal tube remains above the carina and enteric tube in the proximal stomach. Right IJ central venous line again noted. There is extensive subcutaneous and chest wall emphysema.  IMPRESSION: 1. Large right pneumothorax, new since the earlier radiograph, with shift of the mediastinum into the left hemithorax concerning for tension. 2. Bilateral chest tubes. These results were called by telephone at the time of interpretation on 04/02/2020 at 8:27 pm to nurse Juanetta Gosling, who verbally acknowledged these results. Electronically Signed   By: Elgie Collard M.D.   On: 04/02/2020 20:31   DG CHEST PORT 1 VIEW  Result Date: 04/02/2020 CLINICAL DATA:  New left chest tube. EXAM: PORTABLE CHEST 1 VIEW COMPARISON:  Chest radiograph earlier same date FINDINGS: ET tube mid trachea. Enteric tube courses inferior to the diaphragm. Right IJ central venous catheter tip projects over the superior vena cava. Bilateral chest tubes in place. Monitoring leads overlie the patient. Stable cardiac and mediastinal contours. No definite residual pneumothorax bilaterally. Redemonstrated patchy bilateral airspace opacities. Extensive subcutaneous emphysema. IMPRESSION: 1. Bilateral chest tubes in place. No definite residual pneumothorax bilaterally. 2. Redemonstrated patchy bilateral airspace opacities. 3. Extensive subcutaneous emphysema. Electronically Signed   By: Annia Belt M.D.   On: 04/02/2020 14:48   DG Abd Portable 1V  Result Date: 04/30/2020 CLINICAL DATA:  NG tube placement EXAM: PORTABLE ABDOMEN - 1 VIEW COMPARISON:  04/02/2020 FINDINGS: The tip of the NG tube projects over the gastric antrum/pylorus. The tip is pointed distally. The visualized bowel gas pattern is unremarkable. IMPRESSION: Negative. Electronically Signed   By: Katherine Mantle M.D.   On: 04/30/2020 01:41   VAS [place] LOWER EXTREMITY VENOUS (DVT)  Result Date: 04/27/2020  Lower Venous DVT Study Indications: Fever.  Limitations: Ventilation. Comparison Study: No prior study Performing Technologist: Sherren Kerns RVS  Examination Guidelines: A complete evaluation includes B-mode imaging, spectral Doppler, color Doppler, and power  Doppler as needed of all accessible portions of each vessel. Bilateral testing is considered an integral part of a complete examination. Limited examinations for reoccurring indications may be performed as noted. The reflux portion of the exam is performed with the patient in reverse Trendelenburg.  +---------+---------------+---------+-----------+----------+-------------------+ RIGHT    CompressibilityPhasicitySpontaneityPropertiesThrombus Aging      +---------+---------------+---------+-----------+----------+-------------------+ CFV      Full           Yes      Yes                                      +---------+---------------+---------+-----------+----------+-------------------+ SFJ      Full                                                             +---------+---------------+---------+-----------+----------+-------------------+ FV Prox  Full                                                             +---------+---------------+---------+-----------+----------+-------------------+  FV Mid   Full                                                             +---------+---------------+---------+-----------+----------+-------------------+ FV DistalFull                                                             +---------+---------------+---------+-----------+----------+-------------------+ PFV      Full                                                             +---------+---------------+---------+-----------+----------+-------------------+ POP      Full           Yes      Yes                                      +---------+---------------+---------+-----------+----------+-------------------+ PTV                                                   Patent by color and                                                       Doppler             +---------+---------------+---------+-----------+----------+-------------------+ PERO                                                   Patent by color and                                                       Doppler             +---------+---------------+---------+-----------+----------+-------------------+   +---------+---------------+---------+-----------+----------+--------------+ LEFT     CompressibilityPhasicitySpontaneityPropertiesThrombus Aging +---------+---------------+---------+-----------+----------+--------------+ CFV      Full           Yes      Yes                                 +---------+---------------+---------+-----------+----------+--------------+ SFJ      Full                                                        +---------+---------------+---------+-----------+----------+--------------+  FV Prox  Full                                                        +---------+---------------+---------+-----------+----------+--------------+ FV Mid   Full                                                        +---------+---------------+---------+-----------+----------+--------------+ FV DistalFull                                                        +---------+---------------+---------+-----------+----------+--------------+ PFV      Full                                                        +---------+---------------+---------+-----------+----------+--------------+ POP      Full           Yes      Yes                                 +---------+---------------+---------+-----------+----------+--------------+ PTV      Full                                                        +---------+---------------+---------+-----------+----------+--------------+ PERO     Full                                                        +---------+---------------+---------+-----------+----------+--------------+     Summary: BILATERAL: - No evidence of deep vein thrombosis seen in the lower extremities, bilaterally. -   *See table(s) above for  measurements and observations. Electronically signed by Heath Lark on 04/27/2020 at 7:05:56 PM.    Final    VAS [place] UPPER EXTREMITY VENOUS DUPLEX  Result Date: 04/27/2020 UPPER VENOUS STUDY  Indications: fever of unknown origin, recent Covid-19 infection Limitations: Trach collar, ventilation. Comparison Study: No prior study Performing Technologist: Sherren Kerns RVS  Examination Guidelines: A complete evaluation includes B-mode imaging, spectral Doppler, color Doppler, and power Doppler as needed of all accessible portions of each vessel. Bilateral testing is considered an integral part of a complete examination. Limited examinations for reoccurring indications may be performed as noted.  Right Findings: +----------+------------+---------+-----------+----------+--------------+ RIGHT     CompressiblePhasicitySpontaneousProperties   Summary     +----------+------------+---------+-----------+----------+--------------+ IJV  Not visualized +----------+------------+---------+-----------+----------+--------------+ Subclavian               Yes       Yes                             +----------+------------+---------+-----------+----------+--------------+ Axillary      Full       Yes       Yes                             +----------+------------+---------+-----------+----------+--------------+ Brachial      Full       Yes       Yes                             +----------+------------+---------+-----------+----------+--------------+ Radial        Full                                                 +----------+------------+---------+-----------+----------+--------------+ Cephalic      Full                                                 +----------+------------+---------+-----------+----------+--------------+ Basilic       Full                                                  +----------+------------+---------+-----------+----------+--------------+  Left Findings: +----------+------------+---------+-----------+----------+-----------------+ LEFT      CompressiblePhasicitySpontaneousProperties     Summary      +----------+------------+---------+-----------+----------+-----------------+ IJV                                                  Not visualized   +----------+------------+---------+-----------+----------+-----------------+ Subclavian                                           Not visualized   +----------+------------+---------+-----------+----------+-----------------+ Axillary                 Yes       Yes                                +----------+------------+---------+-----------+----------+-----------------+ Brachial      Full       Yes       Yes                                +----------+------------+---------+-----------+----------+-----------------+ Cephalic      None  Acute (PICC line) +----------+------------+---------+-----------+----------+-----------------+ Basilic       Full                                                    +----------+------------+---------+-----------+----------+-----------------+  Summary:  Right: Findings consistent with acute superficial vein thrombosis involving the right cephalic vein.  *See table(s) above for measurements and observations.  Diagnosing physician: Heath Lark Electronically signed by Heath Lark on 04/27/2020 at 7:05:40 PM.    Final    [place] EKG SITE RITE  Result Date: 04/22/2020 If Site Rite image not attached, placement could not be confirmed due to current cardiac rhythm.   Labs:  CBC: Recent Labs    04/26/20 0251 04/27/20 0309 04/29/20 0025 04/30/20 1033  WBC 7.7 9.5 8.6 10.8*  HGB 9.8* 9.8* 9.7* 9.9*  HCT 32.0* 31.2* 31.5* 31.6*  PLT 567* 569* 540* 552*    COAGS: No results for input(s): INR, APTT in the last 8760  hours.  BMP: Recent Labs    03/25/20 1310 03/25/20 1310 03/26/20 0406 03/26/20 0406 03/27/20 0341 03/27/20 0341 03/28/20 0522 03/29/20 0440 04/27/20 0309 04/28/20 0204 04/29/20 0025 04/30/20 1033  NA 134*   < > 132*   < > 135   < > 137   < > 139 138 137 138  K 4.6   < > 4.1   < > 4.4   < > 4.6   < > 4.1 4.0 4.2 4.5  CL 94*   < > 96*   < > 97*   < > 101   < > 96* 97* 97* 96*  CO2 25   < > 23   < > 27   < > 24   < > 35* 33* 32 32  GLUCOSE 102*   < > 153*   < > 153*   < > 169*   < > 140* 137* 147* 137*  BUN 16   < > 19   < > 23*   < > 28*   < > 14 15 15 15   CALCIUM 8.9   < > 8.1*   < > 8.1*   < > 8.0*   < > 8.2* 8.4* 8.4* 8.6*  CREATININE 0.87   < > 0.77   < > 0.74   < > 0.79   < > 0.43* 0.53* 0.47* 0.49*  GFRNONAA >60   < > >60   < > >60   < > >60   < > >60 >60 >60 >60  GFRAA >60  --  >60  --  >60  --  >60  --   --   --   --   --    < > = values in this interval not displayed.    LIVER FUNCTION TESTS: Recent Labs    04/20/20 0422 04/22/20 0455 04/26/20 0251 04/28/20 0204  BILITOT 0.3 0.3 0.4 0.3  AST 28 76* 69* 47*  ALT 70* 144* 175* 161*  ALKPHOS 120 154* 150* 152*  PROT 5.2* 6.2* 6.3* 6.7  ALBUMIN 1.5* 1.9* 1.9* 2.1*    TUMOR MARKERS: No results for input(s): AFPTM, CEA, CA199, CHROMGRNA in the last 8760 hours.  Assessment and Plan:  Hx Covid and now with trach Bilat PTX-- chest tubes placed-- now removed 11/8 Dysphagia Deconditioning Long term care Scheduled for percutaneous gastric tube  placement Risks and benefits image guided gastrostomy tube placement was discussed with the patient and wife including, but not limited to the need for a barium enema during the procedure, bleeding, infection, peritonitis and/or damage to adjacent structures.  All of the patient's questions were answered, patient is agreeable to proceed.  Consent signed and in chart.   Thank you for this interesting consult.  I greatly enjoyed meeting Diyari Cherne and look  forward to participating in their care.  A copy of this report was sent to the requesting provider on this date.  Electronically Signed: Robet Leu, PA-C 05/02/2020, 1:39 PM   I spent a total of 40 Minutes    in face to face in clinical consultation, greater than 50% of which was counseling/coordinating care for percutaneous gastric tube placement

## 2020-05-03 ENCOUNTER — Other Ambulatory Visit (HOSPITAL_COMMUNITY): Payer: 59

## 2020-05-03 HISTORY — PX: IR GASTROSTOMY TUBE MOD SED: IMG625

## 2020-05-03 LAB — CBC
HCT: 34.3 % — ABNORMAL LOW (ref 39.0–52.0)
Hemoglobin: 10.7 g/dL — ABNORMAL LOW (ref 13.0–17.0)
MCH: 29 pg (ref 26.0–34.0)
MCHC: 31.2 g/dL (ref 30.0–36.0)
MCV: 93 fL (ref 80.0–100.0)
Platelets: 472 10*3/uL — ABNORMAL HIGH (ref 150–400)
RBC: 3.69 MIL/uL — ABNORMAL LOW (ref 4.22–5.81)
RDW: 13.5 % (ref 11.5–15.5)
WBC: 10.3 10*3/uL (ref 4.0–10.5)
nRBC: 0 % (ref 0.0–0.2)

## 2020-05-03 LAB — PROTIME-INR
INR: 1.1 (ref 0.8–1.2)
Prothrombin Time: 13.4 seconds (ref 11.4–15.2)

## 2020-05-03 MED ORDER — GLUCAGON HCL (RDNA) 1 MG IJ SOLR
INTRAMUSCULAR | Status: AC | PRN
  Administered 2020-05-03: .5 mg via INTRAVENOUS

## 2020-05-03 MED ORDER — MIDAZOLAM HCL 2 MG/2ML IJ SOLN
INTRAMUSCULAR | Status: AC
  Filled 2020-05-03: qty 2

## 2020-05-03 MED ORDER — IOHEXOL 300 MG/ML  SOLN
50.0000 mL | Freq: Once | INTRAMUSCULAR | Status: AC | PRN
  Administered 2020-05-03: 15 mL

## 2020-05-03 MED ORDER — LIDOCAINE HCL 1 % IJ SOLN
INTRAMUSCULAR | Status: AC
  Filled 2020-05-03: qty 20

## 2020-05-03 MED ORDER — MIDAZOLAM HCL 2 MG/2ML IJ SOLN
INTRAMUSCULAR | Status: AC | PRN
  Administered 2020-05-03 (×2): 1 mg via INTRAVENOUS

## 2020-05-03 MED ORDER — LIDOCAINE HCL (PF) 1 % IJ SOLN
INTRAMUSCULAR | Status: AC | PRN
  Administered 2020-05-03: 10 mL

## 2020-05-03 MED ORDER — GLUCAGON HCL RDNA (DIAGNOSTIC) 1 MG IJ SOLR
INTRAMUSCULAR | Status: AC
  Filled 2020-05-03: qty 1

## 2020-05-03 MED ORDER — FENTANYL CITRATE (PF) 100 MCG/2ML IJ SOLN
INTRAMUSCULAR | Status: AC
  Filled 2020-05-03: qty 2

## 2020-05-03 MED ORDER — FENTANYL CITRATE (PF) 100 MCG/2ML IJ SOLN
INTRAMUSCULAR | Status: AC | PRN
Start: 2020-05-03 — End: 2020-05-03
  Administered 2020-05-03 (×2): 50 ug via INTRAVENOUS

## 2020-05-03 MED ORDER — CEFAZOLIN SODIUM-DEXTROSE 2-4 GM/100ML-% IV SOLN: 2.0000 g | Freq: Once | INTRAVENOUS | Status: DC

## 2020-05-03 NOTE — Progress Notes (Signed)
Pulmonary Critical Care Medicine [place]   PULMONARY CRITICAL CARE SERVICE  PROGRESS NOTE  Date of Service: 05/03/2020  Dennis Howe  DGU:[id number]  [name]: 1975-09-12   DOA: 04/29/2020  Referring Physician: Merton Border, MD  HPI: Dennis Howe is a 44 y.o. male seen for follow up of Acute on Chronic Respiratory Failure.  Patient is on pressure support 40% FiO2 goal of 8 hours  Medications: Reviewed on Rounds  Physical Exam:  Vitals: Temperature is 97.8 pulse 78 respiratory 28 blood pressure is 104/62 saturations 98%  Ventilator Settings pressure support FiO2 40% pressure 12/5  . General: Comfortable at this time . Eyes: Grossly normal lids, irises & conjunctiva . ENT: grossly tongue is normal . Neck: no obvious mass . Cardiovascular: S1 S2 normal no gallop . Respiratory: No rhonchi coarse breath sounds . Abdomen: soft . Skin: no rash seen on limited exam . Musculoskeletal: not rigid . Psychiatric:unable to assess . Neurologic: no seizure no involuntary movements         Lab Data:   Basic Metabolic Panel: Recent Labs  Lab 04/27/20 0309 04/28/20 0204 04/29/20 0025 04/30/20 1033  NA 139 138 137 138  K 4.1 4.0 4.2 4.5  CL 96* 97* 97* 96*  CO2 35* 33* 32 32  GLUCOSE 140* 137* 147* 137*  BUN $Re'14 15 15 15  'kcD$ CREATININE 0.43* 0.53* 0.47* 0.49*  CALCIUM 8.2* 8.4* 8.4* 8.6*    ABG: No results for input(s): PHART, PCO2ART, PO2ART, HCO3, O2SAT in the last 168 hours.  Liver Function Tests: Recent Labs  Lab 04/28/20 0204  AST 47*  ALT 161*  ALKPHOS 152*  BILITOT 0.3  PROT 6.7  ALBUMIN 2.1*   No results for input(s): LIPASE, AMYLASE in the last 168 hours. No results for input(s): AMMONIA in the last 168 hours.  CBC: Recent Labs  Lab 04/27/20 0309 04/29/20 0025 04/30/20 1033 05/03/20 0420  WBC 9.5 8.6 10.8* 10.3  HGB 9.8* 9.7* 9.9* 10.7*  HCT 31.2* 31.5* 31.6* 34.3*  MCV 95.7 95.7 94.6 93.0  PLT 569* 540* 552* 472*     Cardiac Enzymes: No results for input(s): CKTOTAL, CKMB, CKMBINDEX, TROPONINI in the last 168 hours.  BNP (last 3 results) Recent Labs    03/30/20 0436  BNP 103.0*    ProBNP (last 3 results) No results for input(s): PROBNP in the last 8760 hours.  Radiological Exams: CT ABDOMEN WO CONTRAST  Result Date: 05/02/2020 CLINICAL DATA:  Feeding tube placement assessment. EXAM: CT ABDOMEN WITHOUT CONTRAST TECHNIQUE: Multidetector CT imaging of the abdomen was performed following the standard protocol without IV contrast. COMPARISON:  None. FINDINGS: Lower chest: Bilateral chest tubes in place. No definite pneumothorax. Patchy bilateral lung infiltrates with cavitary process in the right upper lobe. No pleural effusions. The heart is normal in size. No pericardial effusion. There is an NG tube coursing down the esophagus and into the stomach. Hepatobiliary: No hepatic lesions or intrahepatic biliary dilatation. The gallbladder is grossly normal. No common bile duct dilatation. Pancreas: No mass, inflammation or ductal dilatation. Spleen: Normal size. No focal lesions. Adrenals/Urinary Tract: The adrenal glands and kidneys are unremarkable. Stomach/Bowel: The stomach, duodenum, visualized small bowel and visualized colon are grossly normal. Vascular/Lymphatic: The aorta is normal in caliber. No atheroscerlotic calcifications. No mesenteric of retroperitoneal mass or adenopathy. Small scattered lymph nodes are noted. Other: No ascites or abdominal wall hernia. Musculoskeletal: No significant bony findings. IMPRESSION: 1. Bilateral chest tubes in place. No definite pneumothorax.  2. Patchy bilateral lung infiltrates with cavitary process in the right upper lobe. 3. No acute abdominal findings, mass lesions or adenopathy. Electronically Signed   By: Marijo Sanes M.D.   On: 05/02/2020 06:51   IR GASTROSTOMY TUBE MOD SED  Result Date: 05/03/2020 CLINICAL DATA:  Respiratory failure and need for  gastrostomy tube for long-term nutritional needs. EXAM: PERCUTANEOUS GASTROSTOMY TUBE PLACEMENT ANESTHESIA/SEDATION: 2.0 mg IV Versed; 100 mcg IV Fentanyl. Total Moderate Sedation Time 19 minutes. The patient's level of consciousness and physiologic status were continuously monitored during the procedure by Radiology nursing. CONTRAST:  30mL OMNIPAQUE IOHEXOL 300 MG/ML  SOLN MEDICATIONS: 2 g IV Ancef. IV antibiotic was administered in an appropriate time interval prior to needle puncture of the skin. 0.5 mg IV glucagon FLUOROSCOPY TIME:  8 minutes and 30 seconds.  109 mGy. PROCEDURE: The procedure, risks, benefits, and alternatives were explained to the patient's wife. Questions regarding the procedure were encouraged and answered. The patient's wife understands and consents to the procedure. A 5-French catheter was then advanced through the patient's mouth under fluoroscopy into the esophagus and to the level of the stomach. This catheter was used to insufflate the stomach with air under fluoroscopy. The abdominal wall was prepped with Betadine in a sterile fashion, and a sterile drape was applied covering the operative field. A sterile gown and sterile gloves were used for the procedure. Local anesthesia was provided with 1% Lidocaine. A skin incision was made in the upper abdominal wall. Under fluoroscopy, an 18 gauge trocar needle was advanced into the stomach. Contrast injection was performed to confirm intraluminal position of the needle tip. A single T tack was then deployed in the lumen of the stomach. This was brought up to tension at the skin surface. Over a guidewire, a 9-French sheath was advanced into the lumen of the stomach. The wire was left in place as a safety wire. A loop snare device from a percutaneous gastrostomy kit was then advanced into the stomach. A floppy guide wire was advanced through the orogastric catheter under fluoroscopy in the stomach. The loop snare advanced through the  percutaneous gastric access was used to snare the guide wire. This allowed withdrawal of the loop snare out of the patient's mouth by retraction of the orogastric catheter and wire. A 20-French bumper retention gastrostomy tube was looped around the snare device. It was then pulled back through the patient's mouth. The retention bumper was brought up to the anterior gastric wall. The T tack suture was cut at the skin. The exiting gastrostomy tube was cut to appropriate length and a feeding adapter applied. The catheter was injected with contrast material to confirm position and a fluoroscopic spot image saved. The tube was then flushed with saline. A dressing was applied over the gastrostomy exit site. COMPLICATIONS: None. FINDINGS: The stomach distended well with air allowing safe placement of the gastrostomy tube. After placement, the tip of the gastrostomy tube lies in the body of the stomach. IMPRESSION: Percutaneous gastrostomy with placement of a 20-French bumper retention tube in the body of the stomach. This tube can be used for percutaneous feeds beginning in 24 hours after placement. Electronically Signed   By: Aletta Edouard M.D.   On: 05/03/2020 14:06   DG Chest Port 1 View  Result Date: 05/03/2020 CLINICAL DATA:  Pneumothorax EXAM: PORTABLE CHEST 1 VIEW COMPARISON:  Yesterday at 3:17 p.m. FINDINGS: Left pneumothorax measuring 17 mm in thickness at the apex. The extent and size is  similar to comparison. Low volume chest with diffuse coarse infiltrates. Normal heart size. Stable tracheostomy and feeding tube position. IMPRESSION: Similar appearance of the small left pneumothorax. Stable inflation. Electronically Signed   By: Monte Fantasia M.D.   On: 05/03/2020 06:47   DG CHEST PORT 1 VIEW  Result Date: 05/02/2020 CLINICAL DATA:  Recent pneumothorax EXAM: PORTABLE CHEST 1 VIEW COMPARISON:  May 02, 2020 study obtained earlier in the day FINDINGS: The left-sided pneumothorax is again noted,  slightly smaller laterally and essentially stable in the apex/apicolateral region compared to earlier in the day. No tension component. Tracheostomy catheter tip is 5.0 cm above the carina. Feeding tube tip is below the diaphragm, likely in the duodenum. Patchy airspace opacity bilaterally stable. Heart size is within normal limits. No adenopathy. IMPRESSION: Pneumothorax on the left slightly smaller compared to earlier in the day without tension component evident. Tube and catheter positions as described. Patchy airspace opacity bilaterally is stable. Stable cardiac silhouette. Electronically Signed   By: Lowella Grip III M.D.   On: 05/02/2020 15:33   DG CHEST PORT 1 VIEW  Result Date: 05/02/2020 CLINICAL DATA:  Pneumothorax EXAM: PORTABLE CHEST 1 VIEW COMPARISON:  May 02, 2020 study obtained earlier in the day FINDINGS: Left chest tube has been removed. There is a moderate pneumothorax on the left without tension component. Chest tube removed on the right without pneumothorax. Tracheostomy catheter tip is 4.2 cm above the carina. Feeding tube tip is below the diaphragm. Patchy airspace opacity throughout the lungs bilaterally is stable. Heart size is normal. Pulmonary vascularity normal. No adenopathy. No bone lesions. IMPRESSION: Moderate pneumothorax on the left after chest tube removal from the left side. Chest tube removed on the right without pneumothorax. Multifocal airspace opacity remains without change. Stable cardiac silhouette. Critical Value/emergent results were called by telephone at the time of interpretation on 05/02/2020 at 2:07 pm to provider ALI HIJAZI , who verbally acknowledged these results. Electronically Signed   By: Lowella Grip III M.D.   On: 05/02/2020 14:07   DG Chest Port 1 View  Result Date: 05/02/2020 CLINICAL DATA:  Follow-up pneumothorax. EXAM: PORTABLE CHEST 1 VIEW COMPARISON:  Multiple recent chest films. The most recent is 05/01/2020 FINDINGS: Bilateral  chest tubes are stable. No definite pneumothorax. The tracheostomy tube is stable. The feeding tube is stable. Slight overall improved lung aeration when compared to yesterday's film. Persistent patchy infiltrates. IMPRESSION: Bilateral chest tubes without definite pneumothorax. Persistent patchy infiltrates but overall slight improved aeration. Electronically Signed   By: Marijo Sanes M.D.   On: 05/02/2020 06:45    Assessment/Plan Active Problems:   Acute on chronic respiratory failure with hypoxia (HCC)   COVID-19 virus infection   Pneumonia due to COVID-19 virus   Bilateral pneumothoraces   1. Acute on chronic respiratory failure hypoxia we will continue with pressure support goal of 8 hours 2. COVID-19 virus infection in recovery we will continue to monitor 3. Pneumonia due to COVID-19 treated 4. Bilateral pneumothoraces resolved with chest tubes   I have personally seen and evaluated the patient, evaluated laboratory and imaging results, formulated the assessment and plan and placed orders. The Patient requires high complexity decision making with multiple systems involvement.  Rounds were done with the Respiratory Therapy Director and Staff therapists and discussed with nursing staff also.  Allyne Gee, MD [facility] Pulmonary Critical Care Medicine Sleep Medicine

## 2020-05-03 NOTE — Procedures (Signed)
Interventional Radiology Procedure Note  Procedure: Percutaneous gastrostomy tube placement  Complications: None  Estimated Blood Loss: < 10 mL  Findings: 20 Fr bumper retention gastrostomy tube placed with tip in body of stomach. OK to use in 24 hours.  Brody Bonneau T. Eryck Negron, M.D Pager:  [phone]   

## 2020-05-04 LAB — BASIC METABOLIC PANEL
Anion gap: 11 (ref 5–15)
BUN: 17 mg/dL (ref 6–20)
CO2: 28 mmol/L (ref 22–32)
Calcium: 8.6 mg/dL — ABNORMAL LOW (ref 8.9–10.3)
Chloride: 96 mmol/L — ABNORMAL LOW (ref 98–111)
Creatinine, Ser: 0.47 mg/dL — ABNORMAL LOW (ref 0.61–1.24)
GFR, Estimated: >60 mL/min (ref >60)
Glucose, Bld: 164 mg/dL — ABNORMAL HIGH (ref 70–99)
Potassium: 4.4 mmol/L (ref 3.5–5.1)
Sodium: 135 mmol/L (ref 135–145)

## 2020-05-04 LAB — CBC
HCT: 35.8 % — ABNORMAL LOW (ref 39.0–52.0)
Hemoglobin: 11.3 g/dL — ABNORMAL LOW (ref 13.0–17.0)
MCH: 28.9 pg (ref 26.0–34.0)
MCHC: 31.6 g/dL (ref 30.0–36.0)
MCV: 91.6 fL (ref 80.0–100.0)
Platelets: 480 10*3/uL — ABNORMAL HIGH (ref 150–400)
RBC: 3.91 MIL/uL — ABNORMAL LOW (ref 4.22–5.81)
RDW: 13.7 % (ref 11.5–15.5)
WBC: 9.9 10*3/uL (ref 4.0–10.5)
nRBC: 0 % (ref 0.0–0.2)

## 2020-05-04 LAB — HEMOGLOBIN A1C
Hgb A1c MFr Bld: 6.2 % — ABNORMAL HIGH (ref 4.8–5.6)
Mean Plasma Glucose: 131.24 mg/dL

## 2020-05-04 NOTE — Progress Notes (Signed)
Pulmonary Critical Care Medicine [place]   PULMONARY CRITICAL CARE SERVICE  PROGRESS NOTE  Date of Service: 05/04/2020  Dennis Howe  UKG:[id number]  [name]: [birth year]   DOA: 04/29/2020  Referring Physician: Merton Border, MD  HPI: Dennis Howe is a 44 y.o. male seen for follow up of Acute on Chronic Respiratory Failure.  Patient currently is on pressure support has been on 40% FiO2 with a pressure support of 12/5  Medications: Reviewed on Rounds  Physical Exam:  Vitals: Temperature is 97.4 pulse seventy-one respiratory rate twenty-two blood pressure is 119/73 saturations 99%  Ventilator Settings on pressure support FiO2 40% pressure 12/5  . General: Comfortable at this time . Eyes: Grossly normal lids, irises & conjunctiva . ENT: grossly tongue is normal . Neck: no obvious mass . Cardiovascular: S1 S2 normal no gallop . Respiratory: No rhonchi very coarse breath sounds . Abdomen: soft . Skin: no rash seen on limited exam . Musculoskeletal: not rigid . Psychiatric:unable to assess . Neurologic: no seizure no involuntary movements         Lab Data:   Basic Metabolic Panel: Recent Labs  Lab 04/28/20 0204 04/29/20 0025 04/30/20 1033 05/04/20 0415  NA 138 137 138 135  K 4.0 4.2 4.5 4.4  CL 97* 97* 96* 96*  CO2 33* 32 32 28  GLUCOSE 137* 147* 137* 164*  BUN _0 CREATININE 0.53* 0.47* 0.49* 0.47*  CALCIUM 8.4* 8.4* 8.6* 8.6*    ABG: No results for input(s): PHART, PCO2ART, PO2ART, HCO3, O2SAT in the last 168 hours.  Liver Function Tests: Recent Labs  Lab 04/28/20 0204  AST 47*  ALT 161*  ALKPHOS 152*  BILITOT 0.3  PROT 6.7  ALBUMIN 2.1*   No results for input(s): LIPASE, AMYLASE in the last 168 hours. No results for input(s): AMMONIA in the last 168 hours.  CBC: Recent Labs  Lab 04/29/20 0025 04/30/20 1033 05/03/20 0420 05/04/20 0415  WBC 8.6 10.8* 10.3 9.9  HGB 9.7* 9.9* 10.7* 11.3*  HCT 31.5*  31.6* 34.3* 35.8*  MCV 95.7 94.6 93.0 91.6  PLT 540* 552* 472* 480*    Cardiac Enzymes: No results for input(s): CKTOTAL, CKMB, CKMBINDEX, TROPONINI in the last 168 hours.  BNP (last 3 results) Recent Labs    03/30/20 0436  BNP 103.0*    ProBNP (last 3 results) No results for input(s): PROBNP in the last 8760 hours.  Radiological Exams: IR GASTROSTOMY TUBE MOD SED  Result Date: 05/03/2020 CLINICAL DATA:  Respiratory failure and need for gastrostomy tube for long-term nutritional needs. EXAM: PERCUTANEOUS GASTROSTOMY TUBE PLACEMENT ANESTHESIA/SEDATION: 2.0 mg IV Versed; 100 mcg IV Fentanyl. Total Moderate Sedation Time 19 minutes. The patient's level of consciousness and physiologic status were continuously monitored during the procedure by Radiology nursing. CONTRAST:  46m OMNIPAQUE IOHEXOL 300 MG/ML  SOLN MEDICATIONS: 2 g IV Ancef. IV antibiotic was administered in an appropriate time interval prior to needle puncture of the skin. 0.5 mg IV glucagon FLUOROSCOPY TIME:  8 minutes and 30 seconds.  109 mGy. PROCEDURE: The procedure, risks, benefits, and alternatives were explained to the patient's wife. Questions regarding the procedure were encouraged and answered. The patient's wife understands and consents to the procedure. A 5-French catheter was then advanced through the patient's mouth under fluoroscopy into the esophagus and to the level of the stomach. This catheter was used to insufflate the stomach with air under fluoroscopy. The abdominal wall was prepped with Betadine  in a sterile fashion, and a sterile drape was applied covering the operative field. A sterile gown and sterile gloves were used for the procedure. Local anesthesia was provided with 1% Lidocaine. A skin incision was made in the upper abdominal wall. Under fluoroscopy, an 18 gauge trocar needle was advanced into the stomach. Contrast injection was performed to confirm intraluminal position of the needle tip. A single T  tack was then deployed in the lumen of the stomach. This was brought up to tension at the skin surface. Over a guidewire, a 9-French sheath was advanced into the lumen of the stomach. The wire was left in place as a safety wire. A loop snare device from a percutaneous gastrostomy kit was then advanced into the stomach. A floppy guide wire was advanced through the orogastric catheter under fluoroscopy in the stomach. The loop snare advanced through the percutaneous gastric access was used to snare the guide wire. This allowed withdrawal of the loop snare out of the patient's mouth by retraction of the orogastric catheter and wire. A 20-French bumper retention gastrostomy tube was looped around the snare device. It was then pulled back through the patient's mouth. The retention bumper was brought up to the anterior gastric wall. The T tack suture was cut at the skin. The exiting gastrostomy tube was cut to appropriate length and a feeding adapter applied. The catheter was injected with contrast material to confirm position and a fluoroscopic spot image saved. The tube was then flushed with saline. A dressing was applied over the gastrostomy exit site. COMPLICATIONS: None. FINDINGS: The stomach distended well with air allowing safe placement of the gastrostomy tube. After placement, the tip of the gastrostomy tube lies in the body of the stomach. IMPRESSION: Percutaneous gastrostomy with placement of a 20-French bumper retention tube in the body of the stomach. This tube can be used for percutaneous feeds beginning in 24 hours after placement. Electronically Signed   By: Aletta Edouard M.D.   On: 05/03/2020 14:06   DG Chest Port 1 View  Result Date: 05/03/2020 CLINICAL DATA:  Pneumothorax EXAM: PORTABLE CHEST 1 VIEW COMPARISON:  Yesterday at 3:17 p.m. FINDINGS: Left pneumothorax measuring 17 mm in thickness at the apex. The extent and size is similar to comparison. Low volume chest with diffuse coarse  infiltrates. Normal heart size. Stable tracheostomy and feeding tube position. IMPRESSION: Similar appearance of the small left pneumothorax. Stable inflation. Electronically Signed   By: Monte Fantasia M.D.   On: 05/03/2020 06:47   DG CHEST PORT 1 VIEW  Result Date: 05/02/2020 CLINICAL DATA:  Recent pneumothorax EXAM: PORTABLE CHEST 1 VIEW COMPARISON:  May 02, 2020 study obtained earlier in the day FINDINGS: The left-sided pneumothorax is again noted, slightly smaller laterally and essentially stable in the apex/apicolateral region compared to earlier in the day. No tension component. Tracheostomy catheter tip is 5.0 cm above the carina. Feeding tube tip is below the diaphragm, likely in the duodenum. Patchy airspace opacity bilaterally stable. Heart size is within normal limits. No adenopathy. IMPRESSION: Pneumothorax on the left slightly smaller compared to earlier in the day without tension component evident. Tube and catheter positions as described. Patchy airspace opacity bilaterally is stable. Stable cardiac silhouette. Electronically Signed   By: Lowella Grip III M.D.   On: 05/02/2020 15:33   DG CHEST PORT 1 VIEW  Result Date: 05/02/2020 CLINICAL DATA:  Pneumothorax EXAM: PORTABLE CHEST 1 VIEW COMPARISON:  May 02, 2020 study obtained earlier in the day FINDINGS: Left  chest tube has been removed. There is a moderate pneumothorax on the left without tension component. Chest tube removed on the right without pneumothorax. Tracheostomy catheter tip is 4.2 cm above the carina. Feeding tube tip is below the diaphragm. Patchy airspace opacity throughout the lungs bilaterally is stable. Heart size is normal. Pulmonary vascularity normal. No adenopathy. No bone lesions. IMPRESSION: Moderate pneumothorax on the left after chest tube removal from the left side. Chest tube removed on the right without pneumothorax. Multifocal airspace opacity remains without change. Stable cardiac silhouette.  Critical Value/emergent results were called by telephone at the time of interpretation on 05/02/2020 at 2:07 pm to provider ALI HIJAZI , who verbally acknowledged these results. Electronically Signed   By: Lowella Grip III M.D.   On: 05/02/2020 14:07    Assessment/Plan Active Problems:   Acute on chronic respiratory failure with hypoxia (HCC)   COVID-19 virus infection   Pneumonia due to COVID-19 virus   Bilateral pneumothoraces   1. Acute on chronic respiratory failure hypoxia we will continue with delirium protocol patient is doing 12 hours on pressure support 2. COVID-19 virus infection in recovery we will continue to follow 3. Pneumonia due to COVID-19 treated slow improvement 4. Bilateral pneumothoraces status post chest tube placement   I have personally seen and evaluated the patient, evaluated laboratory and imaging results, formulated the assessment and plan and placed orders. The Patient requires high complexity decision making with multiple systems involvement.  Rounds were done with the Respiratory Therapy Director and Staff therapists and discussed with nursing staff also.  Allyne Gee, MD [facility] Pulmonary Critical Care Medicine Sleep Medicine

## 2020-05-05 ENCOUNTER — Other Ambulatory Visit (HOSPITAL_COMMUNITY): Payer: 59

## 2020-05-05 NOTE — Progress Notes (Signed)
Pulmonary Critical Care Medicine [place]   PULMONARY CRITICAL CARE SERVICE  PROGRESS NOTE  Date of Service: 05/05/2020  Dennis Howe  YNW:[id number]  [name]: 1975-10-17   DOA: 04/29/2020  Referring Physician: Merton Border, MD  HPI: Govanni Howe is a 44 y.o. male seen for follow up of Acute on Chronic Respiratory Failure.  Patient currently is on pressure support has been on 35% FiO2 with a goal of 16 hours  Medications: Reviewed on Rounds  Physical Exam:  Vitals: Temperature 97.5 pulse 82 respiratory 22 blood pressure is 110/81 saturations 98%  Ventilator Settings on pressure support FiO2 35% pressure 12/5  . General: Comfortable at this time . Eyes: Grossly normal lids, irises & conjunctiva . ENT: grossly tongue is normal . Neck: no obvious mass . Cardiovascular: S1 S2 normal no gallop . Respiratory: No rhonchi no rales are noted at this time . Abdomen: soft . Skin: no rash seen on limited exam . Musculoskeletal: not rigid . Psychiatric:unable to assess . Neurologic: no seizure no involuntary movements         Lab Data:   Basic Metabolic Panel: Recent Labs  Lab 04/29/20 0025 04/30/20 1033 05/04/20 0415  NA 137 138 135  K 4.2 4.5 4.4  CL 97* 96* 96*  CO2 32 32 28  GLUCOSE 147* 137* 164*  BUN $Re'15 15 17  'vyA$ CREATININE 0.47* 0.49* 0.47*  CALCIUM 8.4* 8.6* 8.6*    ABG: No results for input(s): PHART, PCO2ART, PO2ART, HCO3, O2SAT in the last 168 hours.  Liver Function Tests: No results for input(s): AST, ALT, ALKPHOS, BILITOT, PROT, ALBUMIN in the last 168 hours. No results for input(s): LIPASE, AMYLASE in the last 168 hours. No results for input(s): AMMONIA in the last 168 hours.  CBC: Recent Labs  Lab 04/29/20 0025 04/30/20 1033 05/03/20 0420 05/04/20 0415  WBC 8.6 10.8* 10.3 9.9  HGB 9.7* 9.9* 10.7* 11.3*  HCT 31.5* 31.6* 34.3* 35.8*  MCV 95.7 94.6 93.0 91.6  PLT 540* 552* 472* 480*    Cardiac Enzymes: No  results for input(s): CKTOTAL, CKMB, CKMBINDEX, TROPONINI in the last 168 hours.  BNP (last 3 results) Recent Labs    03/30/20 0436  BNP 103.0*    ProBNP (last 3 results) No results for input(s): PROBNP in the last 8760 hours.  Radiological Exams: IR GASTROSTOMY TUBE MOD SED  Result Date: 05/03/2020 CLINICAL DATA:  Respiratory failure and need for gastrostomy tube for long-term nutritional needs. EXAM: PERCUTANEOUS GASTROSTOMY TUBE PLACEMENT ANESTHESIA/SEDATION: 2.0 mg IV Versed; 100 mcg IV Fentanyl. Total Moderate Sedation Time 19 minutes. The patient's level of consciousness and physiologic status were continuously monitored during the procedure by Radiology nursing. CONTRAST:  52mL OMNIPAQUE IOHEXOL 300 MG/ML  SOLN MEDICATIONS: 2 g IV Ancef. IV antibiotic was administered in an appropriate time interval prior to needle puncture of the skin. 0.5 mg IV glucagon FLUOROSCOPY TIME:  8 minutes and 30 seconds.  109 mGy. PROCEDURE: The procedure, risks, benefits, and alternatives were explained to the patient's wife. Questions regarding the procedure were encouraged and answered. The patient's wife understands and consents to the procedure. A 5-French catheter was then advanced through the patient's mouth under fluoroscopy into the esophagus and to the level of the stomach. This catheter was used to insufflate the stomach with air under fluoroscopy. The abdominal wall was prepped with Betadine in a sterile fashion, and a sterile drape was applied covering the operative field. A sterile gown and sterile gloves  were used for the procedure. Local anesthesia was provided with 1% Lidocaine. A skin incision was made in the upper abdominal wall. Under fluoroscopy, an 18 gauge trocar needle was advanced into the stomach. Contrast injection was performed to confirm intraluminal position of the needle tip. A single T tack was then deployed in the lumen of the stomach. This was brought up to tension at the skin  surface. Over a guidewire, a 9-French sheath was advanced into the lumen of the stomach. The wire was left in place as a safety wire. A loop snare device from a percutaneous gastrostomy kit was then advanced into the stomach. A floppy guide wire was advanced through the orogastric catheter under fluoroscopy in the stomach. The loop snare advanced through the percutaneous gastric access was used to snare the guide wire. This allowed withdrawal of the loop snare out of the patient's mouth by retraction of the orogastric catheter and wire. A 20-French bumper retention gastrostomy tube was looped around the snare device. It was then pulled back through the patient's mouth. The retention bumper was brought up to the anterior gastric wall. The T tack suture was cut at the skin. The exiting gastrostomy tube was cut to appropriate length and a feeding adapter applied. The catheter was injected with contrast material to confirm position and a fluoroscopic spot image saved. The tube was then flushed with saline. A dressing was applied over the gastrostomy exit site. COMPLICATIONS: None. FINDINGS: The stomach distended well with air allowing safe placement of the gastrostomy tube. After placement, the tip of the gastrostomy tube lies in the body of the stomach. IMPRESSION: Percutaneous gastrostomy with placement of a 20-French bumper retention tube in the body of the stomach. This tube can be used for percutaneous feeds beginning in 24 hours after placement. Electronically Signed   By: Aletta Edouard M.D.   On: 05/03/2020 14:06   DG CHEST PORT 1 VIEW  Result Date: 05/05/2020 CLINICAL DATA:  Follow-up pneumothorax. EXAM: PORTABLE CHEST 1 VIEW COMPARISON:  05/03/2020 FINDINGS: The tracheostomy tube is in good position, unchanged. The feeding tube is been removed. Interval decrease in size of the small left apical pneumothorax. Stable diffuse underlying interstitial infiltrates consistent with known COVID pneumonia.  IMPRESSION: Interval decrease in size of the small left apical pneumothorax. Electronically Signed   By: Marijo Sanes M.D.   On: 05/05/2020 05:55    Assessment/Plan Active Problems:   Acute on chronic respiratory failure with hypoxia (HCC)   COVID-19 virus infection   Pneumonia due to COVID-19 virus   Bilateral pneumothoraces   1. Acute on chronic respiratory failure hypoxia we will continue with pressure support goal 16 hours 2. COVID-19 virus infection in recovery 3. Pneumonia due to COVID-19 has been treated we will continue to monitor 4. Bilateral pneumothoraces small left apical pneumothorax appears to be improving   I have personally seen and evaluated the patient, evaluated laboratory and imaging results, formulated the assessment and plan and placed orders. The Patient requires high complexity decision making with multiple systems involvement.  Rounds were done with the Respiratory Therapy Director and Staff therapists and discussed with nursing staff also.  Allyne Gee, MD [facility] Pulmonary Critical Care Medicine Sleep Medicine

## 2020-05-06 NOTE — Progress Notes (Signed)
Pulmonary Critical Care Medicine [facility] [place]   PULMONARY CRITICAL CARE SERVICE  PROGRESS NOTE  Date of Service: 05/06/2020  Dennis Howe  MPN:[id number]  [name]: 12/11/1975   DOA: 04/29/2020  Referring Physician: Carron Curie, MD  HPI: Dennis Howe is a 44 y.o. male seen for follow up of Acute on Chronic Respiratory Failure.  Patient is on T collar currently on 20% FiO2  Medications: Reviewed on Rounds  Physical Exam:  Vitals: Temperature is 97.2 pulse 66 respiratory 29 blood pressure is 114/76 saturations 99%  Ventilator Settings on T collar with an FiO2 of 28%  . General: Comfortable at this time . Eyes: Grossly normal lids, irises & conjunctiva . ENT: grossly tongue is normal . Neck: no obvious mass . Cardiovascular: S1 S2 normal no gallop . Respiratory: No rhonchi no rales noted at this time . Abdomen: soft . Skin: no rash seen on limited exam . Musculoskeletal: not rigid . Psychiatric:unable to assess . Neurologic: no seizure no involuntary movements         Lab Data:   Basic Metabolic Panel: Recent Labs  Lab 04/30/20 1033 05/04/20 0415  NA 138 135  K 4.5 4.4  CL 96* 96*  CO2 32 28  GLUCOSE 137* 164*  BUN 15 17  CREATININE 0.49* 0.47*  CALCIUM 8.6* 8.6*    ABG: No results for input(s): PHART, PCO2ART, PO2ART, HCO3, O2SAT in the last 168 hours.  Liver Function Tests: No results for input(s): AST, ALT, ALKPHOS, BILITOT, PROT, ALBUMIN in the last 168 hours. No results for input(s): LIPASE, AMYLASE in the last 168 hours. No results for input(s): AMMONIA in the last 168 hours.  CBC: Recent Labs  Lab 04/30/20 1033 05/03/20 0420 05/04/20 0415  WBC 10.8* 10.3 9.9  HGB 9.9* 10.7* 11.3*  HCT 31.6* 34.3* 35.8*  MCV 94.6 93.0 91.6  PLT 552* 472* 480*    Cardiac Enzymes: No results for input(s): CKTOTAL, CKMB, CKMBINDEX, TROPONINI in the last 168 hours.  BNP (last 3 results) Recent Labs    03/30/20 0436  BNP  103.0*    ProBNP (last 3 results) No results for input(s): PROBNP in the last 8760 hours.  Radiological Exams: DG CHEST PORT 1 VIEW  Result Date: 05/05/2020 CLINICAL DATA:  Follow-up pneumothorax. EXAM: PORTABLE CHEST 1 VIEW COMPARISON:  05/03/2020 FINDINGS: The tracheostomy tube is in good position, unchanged. The feeding tube is been removed. Interval decrease in size of the small left apical pneumothorax. Stable diffuse underlying interstitial infiltrates consistent with known COVID pneumonia. IMPRESSION: Interval decrease in size of the small left apical pneumothorax. Electronically Signed   By: Rudie Meyer M.D.   On: 05/05/2020 05:55    Assessment/Plan Active Problems:   Acute on chronic respiratory failure with hypoxia (HCC)   COVID-19 virus infection   Pneumonia due to COVID-19 virus   Bilateral pneumothoraces   1. Acute on chronic respiratory failure with hypoxia continue with T-piece titrate oxygen continue pulmonary toilet. 2. COVID-19 virus infection in recovery continue with supportive care 3. Pneumonia due to COVID-19 treated 4. Bilateral pneumothoraces small left apical pneumothorax is still noted.   I have personally seen and evaluated the patient, evaluated laboratory and imaging results, formulated the assessment and plan and placed orders. The Patient requires high complexity decision making with multiple systems involvement.  Rounds were done with the Respiratory Therapy Director and Staff therapists and discussed with nursing staff also.  Yevonne Pax, MD [facility] Pulmonary Critical Care Medicine  Sleep Medicine

## 2020-05-07 NOTE — Progress Notes (Signed)
Pulmonary Critical Care Medicine [facility] [place]   PULMONARY CRITICAL CARE SERVICE  PROGRESS NOTE  Date of Service: 05/07/2020  Dennis Howe  [id number]  [name]: 1976/05/30   DOA: 04/29/2020  Referring Physician: Carron Curie, MD  HPI: Dennis Howe is a 44 y.o. male seen for follow up of Acute on Chronic Respiratory Failure. Patient is on T collar currently on 45% FiO2  Medications: Reviewed on Rounds  Physical Exam:  Vitals: Temperature is 97.3 pulse 60 respiratory rate 17 blood pressure is 113/68 saturations 100%  Ventilator Settings on T collar with an FiO2 45%  . General: Comfortable at this time . Eyes: Grossly normal lids, irises & conjunctiva . ENT: grossly tongue is normal . Neck: no obvious mass . Cardiovascular: S1 S2 normal no gallop . Respiratory: No rhonchi no rales noted at this time . Abdomen: soft . Skin: no rash seen on limited exam . Musculoskeletal: not rigid . Psychiatric:unable to assess . Neurologic: no seizure no involuntary movements         Lab Data:   Basic Metabolic Panel: Recent Labs  Lab 04/30/20 1033 05/04/20 0415  NA 138 135  K 4.5 4.4  CL 96* 96*  CO2 32 28  GLUCOSE 137* 164*  BUN 15 17  CREATININE 0.49* 0.47*  CALCIUM 8.6* 8.6*    ABG: No results for input(s): PHART, PCO2ART, PO2ART, HCO3, O2SAT in the last 168 hours.  Liver Function Tests: No results for input(s): AST, ALT, ALKPHOS, BILITOT, PROT, ALBUMIN in the last 168 hours. No results for input(s): LIPASE, AMYLASE in the last 168 hours. No results for input(s): AMMONIA in the last 168 hours.  CBC: Recent Labs  Lab 04/30/20 1033 05/03/20 0420 05/04/20 0415  WBC 10.8* 10.3 9.9  HGB 9.9* 10.7* 11.3*  HCT 31.6* 34.3* 35.8*  MCV 94.6 93.0 91.6  PLT 552* 472* 480*    Cardiac Enzymes: No results for input(s): CKTOTAL, CKMB, CKMBINDEX, TROPONINI in the last 168 hours.  BNP (last 3 results) Recent Labs    03/30/20 0436  BNP  103.0*    ProBNP (last 3 results) No results for input(s): PROBNP in the last 8760 hours.  Radiological Exams: No results found.  Assessment/Plan Active Problems:   Acute on chronic respiratory failure with hypoxia (HCC)   COVID-19 virus infection   Pneumonia due to COVID-19 virus   Bilateral pneumothoraces   1. Acute on chronic respiratory failure with hypoxia we will continue with T collar trials titrate oxygen continue pulmonary toilet. Goal today is 4 hours 2. COVID-19 virus infection recovery 3. Pneumonia due to COVID-19 treated slow improvement 4. Bilateral pneumothoraces stabilized we will continue with supportive care appreciate radiology input   I have personally seen and evaluated the patient, evaluated laboratory and imaging results, formulated the assessment and plan and placed orders. The Patient requires high complexity decision making with multiple systems involvement.  Rounds were done with the Respiratory Therapy Director and Staff therapists and discussed with nursing staff also.  Yevonne Pax, MD [facility] Pulmonary Critical Care Medicine Sleep Medicine

## 2020-05-08 NOTE — Progress Notes (Signed)
Pulmonary Critical Care Medicine [facility] [place]   PULMONARY CRITICAL CARE SERVICE  PROGRESS NOTE  Date of Service: 05/08/2020  Dennis Howe  [id number]  [name]: [birth year]   DOA: 04/29/2020  Referring Physician: Carron Curie, MD  HPI: Dennis Howe is a 44 y.o. male seen for follow up of Acute on Chronic Respiratory Failure.  Patient is on the ventilator and full support this morning supposed to do 8 hours of pressure support  Medications: Reviewed on Rounds  Physical Exam:  Vitals: Temperature is 96.5 pulse 75 respiratory rate 24 blood pressure is 114/72 saturations 100%  Ventilator Settings on assist control FiO2 35% tidal volume 430 PEEP 5  . General: Comfortable at this time . Eyes: Grossly normal lids, irises & conjunctiva . ENT: grossly tongue is normal . Neck: no obvious mass . Cardiovascular: S1 S2 normal no gallop . Respiratory: Scattered rhonchi with coarse breath sounds . Abdomen: soft . Skin: no rash seen on limited exam . Musculoskeletal: not rigid . Psychiatric:unable to assess . Neurologic: no seizure no involuntary movements         Lab Data:   Basic Metabolic Panel: Recent Labs  Lab 05/04/20 0415  NA 135  K 4.4  CL 96*  CO2 28  GLUCOSE 164*  BUN 17  CREATININE 0.47*  CALCIUM 8.6*    ABG: No results for input(s): PHART, PCO2ART, PO2ART, HCO3, O2SAT in the last 168 hours.  Liver Function Tests: No results for input(s): AST, ALT, ALKPHOS, BILITOT, PROT, ALBUMIN in the last 168 hours. No results for input(s): LIPASE, AMYLASE in the last 168 hours. No results for input(s): AMMONIA in the last 168 hours.  CBC: Recent Labs  Lab 05/03/20 0420 05/04/20 0415  WBC 10.3 9.9  HGB 10.7* 11.3*  HCT 34.3* 35.8*  MCV 93.0 91.6  PLT 472* 480*    Cardiac Enzymes: No results for input(s): CKTOTAL, CKMB, CKMBINDEX, TROPONINI in the last 168 hours.  BNP (last 3 results) Recent Labs    03/30/20 0436  BNP 103.0*     ProBNP (last 3 results) No results for input(s): PROBNP in the last 8760 hours.  Radiological Exams: No results found.  Assessment/Plan Active Problems:   Acute on chronic respiratory failure with hypoxia (HCC)   COVID-19 virus infection   Pneumonia due to COVID-19 virus   Bilateral pneumothoraces   1. Acute on chronic respiratory failure hypoxia plan is to continue with the weaning protocol today will be doing 8 hours of pressure support 2. COVID-19 virus infection in recovery we will continue to monitor 3. Pneumonia due to COVID-19 treated slow improvement 4. Bilateral pneumothoraces patient is at baseline radiology following along   I have personally seen and evaluated the patient, evaluated laboratory and imaging results, formulated the assessment and plan and placed orders. The Patient requires high complexity decision making with multiple systems involvement.  Rounds were done with the Respiratory Therapy Director and Staff therapists and discussed with nursing staff also.  Yevonne Pax, MD [facility] Pulmonary Critical Care Medicine Sleep Medicine

## 2020-05-09 NOTE — Progress Notes (Signed)
Pulmonary Critical Care Medicine [facility] [place]   PULMONARY CRITICAL CARE SERVICE  PROGRESS NOTE  Date of Service: 05/09/2020  Mannix Kroeker  [id number]  [name]: 05-14-1976   DOA: 04/29/2020  Referring Physician: Carron Curie, MD  HPI: Bowen Kia is a 44 y.o. male seen for follow up of Acute on Chronic Respiratory Failure.  Patient currently is on pressure support has been on 35% FiO2 currently is on a pressure of 12/5  Medications: Reviewed on Rounds  Physical Exam:  Vitals: Temperature 97.6 pulse 84 respiratory rate is 26 blood pressure 104/65 saturations 100%  Ventilator Settings on pressure support FiO2 35% pressure 12/5  . General: Comfortable at this time . Eyes: Grossly normal lids, irises & conjunctiva . ENT: grossly tongue is normal . Neck: no obvious mass . Cardiovascular: S1 S2 normal no gallop . Respiratory: No rhonchi very coarse breath sounds . Abdomen: soft . Skin: no rash seen on limited exam . Musculoskeletal: not rigid . Psychiatric:unable to assess . Neurologic: no seizure no involuntary movements         Lab Data:   Basic Metabolic Panel: Recent Labs  Lab 05/04/20 0415  NA 135  K 4.4  CL 96*  CO2 28  GLUCOSE 164*  BUN 17  CREATININE 0.47*  CALCIUM 8.6*    ABG: No results for input(s): PHART, PCO2ART, PO2ART, HCO3, O2SAT in the last 168 hours.  Liver Function Tests: No results for input(s): AST, ALT, ALKPHOS, BILITOT, PROT, ALBUMIN in the last 168 hours. No results for input(s): LIPASE, AMYLASE in the last 168 hours. No results for input(s): AMMONIA in the last 168 hours.  CBC: Recent Labs  Lab 05/03/20 0420 05/04/20 0415  WBC 10.3 9.9  HGB 10.7* 11.3*  HCT 34.3* 35.8*  MCV 93.0 91.6  PLT 472* 480*    Cardiac Enzymes: No results for input(s): CKTOTAL, CKMB, CKMBINDEX, TROPONINI in the last 168 hours.  BNP (last 3 results) Recent Labs    03/30/20 0436  BNP 103.0*    ProBNP (last 3  results) No results for input(s): PROBNP in the last 8760 hours.  Radiological Exams: No results found.  Assessment/Plan Active Problems:   Acute on chronic respiratory failure with hypoxia (HCC)   COVID-19 virus infection   Pneumonia due to COVID-19 virus   Bilateral pneumothoraces   1. Acute on chronic respiratory failure with hypoxia we will continue with pressure support titrate oxygen continue pulmonary toilet. 2. COVID-19 virus infection in recovery 3. Pneumonia due to COVID-19 slow improvement 4. Bilateral pneumothoraces status post chest tube   I have personally seen and evaluated the patient, evaluated laboratory and imaging results, formulated the assessment and plan and placed orders. The Patient requires high complexity decision making with multiple systems involvement.  Rounds were done with the Respiratory Therapy Director and Staff therapists and discussed with nursing staff also.  Yevonne Pax, MD [facility] Pulmonary Critical Care Medicine Sleep Medicine

## 2020-05-10 ENCOUNTER — Other Ambulatory Visit (HOSPITAL_COMMUNITY): Payer: 59

## 2020-05-10 LAB — CBC
HCT: 40.1 % (ref 39.0–52.0)
Hemoglobin: 12.9 g/dL — ABNORMAL LOW (ref 13.0–17.0)
MCH: 29.7 pg (ref 26.0–34.0)
MCHC: 32.2 g/dL (ref 30.0–36.0)
MCV: 92.2 fL (ref 80.0–100.0)
Platelets: 297 10*3/uL (ref 150–400)
RBC: 4.35 MIL/uL (ref 4.22–5.81)
RDW: 15 % (ref 11.5–15.5)
WBC: 11.5 10*3/uL — ABNORMAL HIGH (ref 4.0–10.5)
nRBC: 0 % (ref 0.0–0.2)

## 2020-05-10 LAB — BASIC METABOLIC PANEL
Anion gap: 9 (ref 5–15)
BUN: 17 mg/dL (ref 6–20)
CO2: 30 mmol/L (ref 22–32)
Calcium: 9.1 mg/dL (ref 8.9–10.3)
Chloride: 98 mmol/L (ref 98–111)
Creatinine, Ser: 0.44 mg/dL — ABNORMAL LOW (ref 0.61–1.24)
GFR, Estimated: >60 mL/min (ref >60)
Glucose, Bld: 206 mg/dL — ABNORMAL HIGH (ref 70–99)
Potassium: 4.4 mmol/L (ref 3.5–5.1)
Sodium: 137 mmol/L (ref 135–145)

## 2020-05-10 NOTE — Progress Notes (Signed)
Pulmonary Critical Care Medicine [facility] [place]   PULMONARY CRITICAL CARE SERVICE  PROGRESS NOTE  Date of Service: 05/10/2020  Dennis Howe  [id number]  [name]: Nov 02, 1975   DOA: 04/29/2020  Referring Physician: Carron Curie, MD  HPI: Dennis Howe is a 44 y.o. male seen for follow up of Acute on Chronic Respiratory Failure.  Patient at this time is on T collar has been on 35% FiO2  Medications: Reviewed on Rounds  Physical Exam:  Vitals: 18 respiratory rate 22 blood pressure is 111/76 saturations 100%  Ventilator Settings on T collar FiO2 of 35%  . General: Comfortable at this time . Eyes: Grossly normal lids, irises & conjunctiva . ENT: grossly tongue is normal . Neck: no obvious mass . Cardiovascular: S1 S2 normal no gallop . Respiratory: No rales . Abdomen: soft . Skin: no rash seen on limited exam . Musculoskeletal: not rigid . Psychiatric:unable to assess . Neurologic: no seizure no involuntary movements         Lab Data:   Basic Metabolic Panel: Recent Labs  Lab 05/04/20 0415 05/10/20 0445  NA 135 137  K 4.4 4.4  CL 96* 98  CO2 28 30  GLUCOSE 164* 206*  BUN 17 17  CREATININE 0.47* 0.44*  CALCIUM 8.6* 9.1    ABG: No results for input(s): PHART, PCO2ART, PO2ART, HCO3, O2SAT in the last 168 hours.  Liver Function Tests: No results for input(s): AST, ALT, ALKPHOS, BILITOT, PROT, ALBUMIN in the last 168 hours. No results for input(s): LIPASE, AMYLASE in the last 168 hours. No results for input(s): AMMONIA in the last 168 hours.  CBC: Recent Labs  Lab 05/04/20 0415 05/10/20 0445  WBC 9.9 11.5*  HGB 11.3* 12.9*  HCT 35.8* 40.1  MCV 91.6 92.2  PLT 480* 297    Cardiac Enzymes: No results for input(s): CKTOTAL, CKMB, CKMBINDEX, TROPONINI in the last 168 hours.  BNP (last 3 results) Recent Labs    03/30/20 0436  BNP 103.0*    ProBNP (last 3 results) No results for input(s): PROBNP in the last 8760  hours.  Radiological Exams: DG CHEST PORT 1 VIEW  Result Date: 05/10/2020 CLINICAL DATA:  Pneumothorax EXAM: PORTABLE CHEST 1 VIEW COMPARISON:  05/05/2020 FINDINGS: Tracheostomy unchanged. Lung volumes are small with stable elevation of the right hemidiaphragm. Coarse infiltrate within the left apex is unchanged. Previously noted left apical pneumothorax has resolved. There is no pneumothorax or pleural effusion identified. Cardiac size within normal limits. IMPRESSION: Resolved left apical pneumothorax. Stable coarse left apical infiltrate. Pulmonary hypoinflation. Electronically Signed   By: Helyn Numbers MD   On: 05/10/2020 05:41    Assessment/Plan Active Problems:   Acute on chronic respiratory failure with hypoxia (HCC)   COVID-19 virus infection   Pneumonia due to COVID-19 virus   Bilateral pneumothoraces   1. Acute on chronic respiratory failure with hypoxia we'll continue with T-piece right now is requiring 35% FiO2 2. COVID-19 virus infection in recovery 3. Pneumonia due to COVID-19 treated 4. Bilateral pneumothoraces stable with chest tubes in place   I have personally seen and evaluated the patient, evaluated laboratory and imaging results, formulated the assessment and plan and placed orders. The Patient requires high complexity decision making with multiple systems involvement.  Rounds were done with the Respiratory Therapy Director and Staff therapists and discussed with nursing staff also.  Yevonne Pax, MD [facility] Pulmonary Critical Care Medicine Sleep Medicine

## 2020-05-11 LAB — BLOOD GAS, ARTERIAL
Acid-Base Excess: 8.6 mmol/L — ABNORMAL HIGH (ref 0.0–2.0)
Bicarbonate: 33.5 mmol/L — ABNORMAL HIGH (ref 20.0–28.0)
FIO2: 30
O2 Saturation: 95.5 %
Patient temperature: 37
pCO2 arterial: 54.1 mmHg — ABNORMAL HIGH (ref 32.0–48.0)
pH, Arterial: 7.408 (ref 7.350–7.450)
pO2, Arterial: 76.7 mmHg — ABNORMAL LOW (ref 83.0–108.0)

## 2020-05-11 NOTE — Progress Notes (Signed)
Pulmonary Critical Care Medicine [facility] [place]   PULMONARY CRITICAL CARE SERVICE  PROGRESS NOTE  Date of Service: 05/11/2020  Dennis Howe  [id number]:[id number]  [name]: 1975-07-09   DOA: 04/29/2020  Referring Physician: Carron Curie, MD  HPI: Dennis Howe is a 44 y.o. male seen for follow up of Acute on Chronic Respiratory Failure.  Patient right now is on 35% FiO2 on T-piece goal is 24 hours today  Medications: Reviewed on Rounds  Physical Exam:  Vitals: Temperature is 97.4 pulse 63 respiratory rate 17 blood pressure is 116/65 saturations 100%  Ventilator Settings on T-piece 35%  . General: Comfortable at this time . Eyes: Grossly normal lids, irises & conjunctiva . ENT: grossly tongue is normal . Neck: no obvious mass . Cardiovascular: S1 S2 normal no gallop . Respiratory: No rhonchi no rales noted . Abdomen: soft . Skin: no rash seen on limited exam . Musculoskeletal: not rigid . Psychiatric:unable to assess . Neurologic: no seizure no involuntary movements         Lab Data:   Basic Metabolic Panel: Recent Labs  Lab 05/10/20 0445  NA 137  K 4.4  CL 98  CO2 30  GLUCOSE 206*  BUN 17  CREATININE 0.44*  CALCIUM 9.1    ABG: No results for input(s): PHART, PCO2ART, PO2ART, HCO3, O2SAT in the last 168 hours.  Liver Function Tests: No results for input(s): AST, ALT, ALKPHOS, BILITOT, PROT, ALBUMIN in the last 168 hours. No results for input(s): LIPASE, AMYLASE in the last 168 hours. No results for input(s): AMMONIA in the last 168 hours.  CBC: Recent Labs  Lab 05/10/20 0445  WBC 11.5*  HGB 12.9*  HCT 40.1  MCV 92.2  PLT 297    Cardiac Enzymes: No results for input(s): CKTOTAL, CKMB, CKMBINDEX, TROPONINI in the last 168 hours.  BNP (last 3 results) Recent Labs    03/30/20 0436  BNP 103.0*    ProBNP (last 3 results) No results for input(s): PROBNP in the last 8760 hours.  Radiological Exams: DG CHEST PORT 1  VIEW  Result Date: 05/10/2020 CLINICAL DATA:  Pneumothorax EXAM: PORTABLE CHEST 1 VIEW COMPARISON:  05/05/2020 FINDINGS: Tracheostomy unchanged. Lung volumes are small with stable elevation of the right hemidiaphragm. Coarse infiltrate within the left apex is unchanged. Previously noted left apical pneumothorax has resolved. There is no pneumothorax or pleural effusion identified. Cardiac size within normal limits. IMPRESSION: Resolved left apical pneumothorax. Stable coarse left apical infiltrate. Pulmonary hypoinflation. Electronically Signed   By: Helyn Numbers MD   On: 05/10/2020 05:41    Assessment/Plan Active Problems:   Acute on chronic respiratory failure with hypoxia (HCC)   COVID-19 virus infection   Pneumonia due to COVID-19 virus   Bilateral pneumothoraces   1. Acute on chronic respiratory failure hypoxia we will continue with T-piece will be completing 24 hours today 2. COVID-19 virus infection in recovery 3. Pneumonia due to COVID-19 treated improving 4. Bilateral pneumothoraces resolved   I have personally seen and evaluated the patient, evaluated laboratory and imaging results, formulated the assessment and plan and placed orders. The Patient requires high complexity decision making with multiple systems involvement.  Rounds were done with the Respiratory Therapy Director and Staff therapists and discussed with nursing staff also.  Yevonne Pax, MD [facility] Pulmonary Critical Care Medicine Sleep Medicine

## 2020-05-12 NOTE — Progress Notes (Signed)
Pulmonary Critical Care Medicine [facility] [place]   PULMONARY CRITICAL CARE SERVICE  PROGRESS NOTE  Date of Service: 05/12/2020  Dennis Howe  [id number]  [name]: [birth year]   DOA: 04/29/2020  Referring Physician: Carron Curie, MD  HPI: Dennis Howe is a 44 y.o. male seen for follow up of Acute on Chronic Respiratory Failure.  Patient is off the ventilator on T collar today will be completing 48 hours  Medications: Reviewed on Rounds  Physical Exam:  Vitals: Temperature is 96.9 pulse 73 respiratory 26 blood pressure is 95/65 saturations 98%  Ventilator Settings on T collar FiO2 30%  . General: Comfortable at this time . Eyes: Grossly normal lids, irises & conjunctiva . ENT: grossly tongue is normal . Neck: no obvious mass . Cardiovascular: S1 S2 normal no gallop . Respiratory: No rhonchi no rales . Abdomen: soft . Skin: no rash seen on limited exam . Musculoskeletal: not rigid . Psychiatric:unable to assess . Neurologic: no seizure no involuntary movements         Lab Data:   Basic Metabolic Panel: Recent Labs  Lab 05/10/20 0445  NA 137  K 4.4  CL 98  CO2 30  GLUCOSE 206*  BUN 17  CREATININE 0.44*  CALCIUM 9.1    ABG: Recent Labs  Lab 05/11/20 1149  PHART 7.408  PCO2ART 54.1*  PO2ART 76.7*  HCO3 33.5*  O2SAT 95.5    Liver Function Tests: No results for input(s): AST, ALT, ALKPHOS, BILITOT, PROT, ALBUMIN in the last 168 hours. No results for input(s): LIPASE, AMYLASE in the last 168 hours. No results for input(s): AMMONIA in the last 168 hours.  CBC: Recent Labs  Lab 05/10/20 0445  WBC 11.5*  HGB 12.9*  HCT 40.1  MCV 92.2  PLT 297    Cardiac Enzymes: No results for input(s): CKTOTAL, CKMB, CKMBINDEX, TROPONINI in the last 168 hours.  BNP (last 3 results) Recent Labs    03/30/20 0436  BNP 103.0*    ProBNP (last 3 results) No results for input(s): PROBNP in the last 8760 hours.  Radiological  Exams: No results found.  Assessment/Plan Active Problems:   Acute on chronic respiratory failure with hypoxia (HCC)   COVID-19 virus infection   Pneumonia due to COVID-19 virus   Bilateral pneumothoraces   1. Acute on chronic respiratory failure hypoxia we will continue with the weaning on T collar today will be 48 hours 2. COVID-19 virus infection in recovery 3. Pneumonia due to COVID-19 improving 4. Bilateral pneumothoraces chest tube in place   I have personally seen and evaluated the patient, evaluated laboratory and imaging results, formulated the assessment and plan and placed orders. The Patient requires high complexity decision making with multiple systems involvement.  Rounds were done with the Respiratory Therapy Director and Staff therapists and discussed with nursing staff also.  Yevonne Pax, MD [facility] Pulmonary Critical Care Medicine Sleep Medicine

## 2020-05-13 NOTE — Progress Notes (Signed)
Pulmonary Critical Care Medicine [facility] [place]   PULMONARY CRITICAL CARE SERVICE  PROGRESS NOTE  Date of Service: 05/13/2020  Dennis Howe  [id number]  [name]: 07-Sep-1975   DOA: 04/29/2020  Referring Physician: Carron Curie, MD  HPI: Dennis Howe is a 44 y.o. male seen for follow up of Acute on Chronic Respiratory Failure.  Patient is on T collar good saturations are noted has been on 28% FiO2 using PMV should be able to start with capping  Medications: Reviewed on Rounds  Physical Exam:  Vitals: Temperature 97.8 pulse 95 respiratory rate 34 blood pressure is 101/73 saturations 97%  Ventilator Settings on T collar with an FiO2 28%  . General: Comfortable at this time . Eyes: Grossly normal lids, irises & conjunctiva . ENT: grossly tongue is normal . Neck: no obvious mass . Cardiovascular: S1 S2 normal no gallop . Respiratory: No rhonchi no rales are noted at this time . Abdomen: soft . Skin: no rash seen on limited exam . Musculoskeletal: not rigid . Psychiatric:unable to assess . Neurologic: no seizure no involuntary movements         Lab Data:   Basic Metabolic Panel: Recent Labs  Lab 05/10/20 0445  NA 137  K 4.4  CL 98  CO2 30  GLUCOSE 206*  BUN 17  CREATININE 0.44*  CALCIUM 9.1    ABG: Recent Labs  Lab 05/11/20 1149  PHART 7.408  PCO2ART 54.1*  PO2ART 76.7*  HCO3 33.5*  O2SAT 95.5    Liver Function Tests: No results for input(s): AST, ALT, ALKPHOS, BILITOT, PROT, ALBUMIN in the last 168 hours. No results for input(s): LIPASE, AMYLASE in the last 168 hours. No results for input(s): AMMONIA in the last 168 hours.  CBC: Recent Labs  Lab 05/10/20 0445  WBC 11.5*  HGB 12.9*  HCT 40.1  MCV 92.2  PLT 297    Cardiac Enzymes: No results for input(s): CKTOTAL, CKMB, CKMBINDEX, TROPONINI in the last 168 hours.  BNP (last 3 results) Recent Labs    03/30/20 0436  BNP 103.0*    ProBNP (last 3  results) No results for input(s): PROBNP in the last 8760 hours.  Radiological Exams: No results found.  Assessment/Plan Active Problems:   Acute on chronic respiratory failure with hypoxia (HCC)   COVID-19 virus infection   Pneumonia due to COVID-19 virus   Bilateral pneumothoraces   1. Acute on chronic respiratory failure with hypoxia we will continue T-piece weaning and proceed to capping once patient is completed prerequisites 2. COVID-19 virus infection in recovery we will continue to follow 3. Pneumonia due to COVID-19 treated 4. Bilateral pneumothorax no change continue to monitor   I have personally seen and evaluated the patient, evaluated laboratory and imaging results, formulated the assessment and plan and placed orders. The Patient requires high complexity decision making with multiple systems involvement.  Rounds were done with the Respiratory Therapy Director and Staff therapists and discussed with nursing staff also.  Yevonne Pax, MD [facility] Pulmonary Critical Care Medicine Sleep Medicine

## 2020-05-14 ENCOUNTER — Other Ambulatory Visit (HOSPITAL_COMMUNITY): Payer: 59

## 2020-05-14 LAB — BASIC METABOLIC PANEL
Anion gap: 12 (ref 5–15)
BUN: 20 mg/dL (ref 6–20)
CO2: 27 mmol/L (ref 22–32)
Calcium: 8.8 mg/dL — ABNORMAL LOW (ref 8.9–10.3)
Chloride: 100 mmol/L (ref 98–111)
Creatinine, Ser: 0.41 mg/dL — ABNORMAL LOW (ref 0.61–1.24)
GFR, Estimated: >60 mL/min (ref >60)
Glucose, Bld: 146 mg/dL — ABNORMAL HIGH (ref 70–99)
Potassium: 4.1 mmol/L (ref 3.5–5.1)
Sodium: 139 mmol/L (ref 135–145)

## 2020-05-14 LAB — CBC
HCT: 41.5 % (ref 39.0–52.0)
Hemoglobin: 13.5 g/dL (ref 13.0–17.0)
MCH: 29.5 pg (ref 26.0–34.0)
MCHC: 32.5 g/dL (ref 30.0–36.0)
MCV: 90.8 fL (ref 80.0–100.0)
Platelets: 255 10*3/uL (ref 150–400)
RBC: 4.57 MIL/uL (ref 4.22–5.81)
RDW: 15.2 % (ref 11.5–15.5)
WBC: 10.1 10*3/uL (ref 4.0–10.5)
nRBC: 0 % (ref 0.0–0.2)

## 2020-05-14 NOTE — Progress Notes (Signed)
Pulmonary Critical Care Medicine [facility] [place]   PULMONARY CRITICAL CARE SERVICE  PROGRESS NOTE  Date of Service: 05/14/2020  Dennis Howe  OIN:[id number]  [name]: [birth year]   DOA: 04/29/2020  Referring Physician: Carron Curie, MD  HPI: Dennis Howe is a 44 y.o. male seen for follow up of Acute on Chronic Respiratory Failure.  Patient is capping on 2 L today will be 24 hours  Medications: Reviewed on Rounds  Physical Exam:  Vitals: Temperature is 97.7 pulse 97 respiratory rate 16 blood pressure is 106/70 saturations 99%  Ventilator Settings capping of the ventilator on 2 L  . General: Comfortable at this time . Eyes: Grossly normal lids, irises & conjunctiva . ENT: grossly tongue is normal . Neck: no obvious mass . Cardiovascular: S1 S2 normal no gallop . Respiratory: No rhonchi no rales are noted at this time . Abdomen: soft . Skin: no rash seen on limited exam . Musculoskeletal: not rigid . Psychiatric:unable to assess . Neurologic: no seizure no involuntary movements         Lab Data:   Basic Metabolic Panel: Recent Labs  Lab 05/10/20 0445 05/14/20 0633  NA 137 139  K 4.4 4.1  CL 98 100  CO2 30 27  GLUCOSE 206* 146*  BUN 17 20  CREATININE 0.44* 0.41*  CALCIUM 9.1 8.8*    ABG: Recent Labs  Lab 05/11/20 1149  PHART 7.408  PCO2ART 54.1*  PO2ART 76.7*  HCO3 33.5*  O2SAT 95.5    Liver Function Tests: No results for input(s): AST, ALT, ALKPHOS, BILITOT, PROT, ALBUMIN in the last 168 hours. No results for input(s): LIPASE, AMYLASE in the last 168 hours. No results for input(s): AMMONIA in the last 168 hours.  CBC: Recent Labs  Lab 05/10/20 0445 05/14/20 0633  WBC 11.5* 10.1  HGB 12.9* 13.5  HCT 40.1 41.5  MCV 92.2 90.8  PLT 297 255    Cardiac Enzymes: No results for input(s): CKTOTAL, CKMB, CKMBINDEX, TROPONINI in the last 168 hours.  BNP (last 3 results) Recent Labs    03/30/20 0436  BNP 103.0*     ProBNP (last 3 results) No results for input(s): PROBNP in the last 8760 hours.  Radiological Exams: DG CHEST PORT 1 VIEW  Result Date: 05/14/2020 CLINICAL DATA:  Follow-up pneumothorax EXAM: PORTABLE CHEST 1 VIEW COMPARISON:  05/10/2020 and prior radiographs FINDINGS: A tracheostomy tube is again noted. This is a low volume study with unchanged elevation of the RIGHT hemidiaphragm. There is no evidence of pneumothorax. LEFT apical opacity is unchanged. Bibasilar opacities/atelectasis are again noted. IMPRESSION: Unchanged chest radiograph with bibasilar opacities/atelectasis and LEFT apical opacity. No pneumothorax. Electronically Signed   By: Harmon Pier M.D.   On: 05/14/2020 08:03    Assessment/Plan Active Problems:   Acute on chronic respiratory failure with hypoxia (HCC)   COVID-19 virus infection   Pneumonia due to COVID-19 virus   Bilateral pneumothoraces   1. Acute on chronic respiratory failure hypoxia patient is doing well with capping continue to advance 2. COVID-19 virus infection recovery 3. Pneumonia due to COVID-19 treated 4. Bilateral pneumothoraces at baseline   I have personally seen and evaluated the patient, evaluated laboratory and imaging results, formulated the assessment and plan and placed orders. The Patient requires high complexity decision making with multiple systems involvement.  Rounds were done with the Respiratory Therapy Director and Staff therapists and discussed with nursing staff also.  Yevonne Pax, MD [facility] Pulmonary Critical Care  Medicine Sleep Medicine

## 2020-05-15 ENCOUNTER — Other Ambulatory Visit (HOSPITAL_COMMUNITY): Payer: 59

## 2020-05-15 NOTE — Progress Notes (Signed)
Pulmonary Critical Care Medicine [facility] [place]   PULMONARY CRITICAL CARE SERVICE  PROGRESS NOTE  Date of Service: 05/15/2020  Dennis Howe  [id number]  [name]: 23-Feb-1976   DOA: 04/29/2020  Referring Physician: Carron Curie, MD  HPI: Dennis Howe is a 44 y.o. male seen for follow up of Acute on Chronic Respiratory Failure.  Patient currently is capping today will be 48 hours  Medications: Reviewed on Rounds  Physical Exam:  Vitals: Temperature 97.0 pulse 68 respiratory rate 19 blood pressure is 107/69 saturations 94%  Ventilator Settings capping off the ventilator  . General: Comfortable at this time . Eyes: Grossly normal lids, irises & conjunctiva . ENT: grossly tongue is normal . Neck: no obvious mass . Cardiovascular: S1 S2 normal no gallop . Respiratory: No rhonchi no rales are noted at this time . Abdomen: soft . Skin: no rash seen on limited exam . Musculoskeletal: not rigid . Psychiatric:unable to assess . Neurologic: no seizure no involuntary movements         Lab Data:   Basic Metabolic Panel: Recent Labs  Lab 05/10/20 0445 05/14/20 0633  NA 137 139  K 4.4 4.1  CL 98 100  CO2 30 27  GLUCOSE 206* 146*  BUN 17 20  CREATININE 0.44* 0.41*  CALCIUM 9.1 8.8*    ABG: Recent Labs  Lab 05/11/20 1149  PHART 7.408  PCO2ART 54.1*  PO2ART 76.7*  HCO3 33.5*  O2SAT 95.5    Liver Function Tests: No results for input(s): AST, ALT, ALKPHOS, BILITOT, PROT, ALBUMIN in the last 168 hours. No results for input(s): LIPASE, AMYLASE in the last 168 hours. No results for input(s): AMMONIA in the last 168 hours.  CBC: Recent Labs  Lab 05/10/20 0445 05/14/20 0633  WBC 11.5* 10.1  HGB 12.9* 13.5  HCT 40.1 41.5  MCV 92.2 90.8  PLT 297 255    Cardiac Enzymes: No results for input(s): CKTOTAL, CKMB, CKMBINDEX, TROPONINI in the last 168 hours.  BNP (last 3 results) Recent Labs    03/30/20 0436  BNP 103.0*    ProBNP  (last 3 results) No results for input(s): PROBNP in the last 8760 hours.  Radiological Exams: DG CHEST PORT 1 VIEW  Result Date: 05/14/2020 CLINICAL DATA:  Follow-up pneumothorax EXAM: PORTABLE CHEST 1 VIEW COMPARISON:  05/10/2020 and prior radiographs FINDINGS: A tracheostomy tube is again noted. This is a low volume study with unchanged elevation of the RIGHT hemidiaphragm. There is no evidence of pneumothorax. LEFT apical opacity is unchanged. Bibasilar opacities/atelectasis are again noted. IMPRESSION: Unchanged chest radiograph with bibasilar opacities/atelectasis and LEFT apical opacity. No pneumothorax. Electronically Signed   By: Harmon Pier M.D.   On: 05/14/2020 08:03    Assessment/Plan Active Problems:   Acute on chronic respiratory failure with hypoxia (HCC)   COVID-19 virus infection   Pneumonia due to COVID-19 virus   Bilateral pneumothoraces   1. Acute on chronic respiratory failure with hypoxia we will continue with the capping as ordered 1 extra day at least to see how he does 2. COVID-19 virus infection recovery 3. Pneumonia due to COVID-19 treated improving 4. Bilateral pneumothoraces status post chest tube drainage   I have personally seen and evaluated the patient, evaluated laboratory and imaging results, formulated the assessment and plan and placed orders. The Patient requires high complexity decision making with multiple systems involvement.  Rounds were done with the Respiratory Therapy Director and Staff therapists and discussed with nursing staff also.  Allyne Gee, MD [facility] Pulmonary Critical Care Medicine Sleep Medicine

## 2020-05-16 ENCOUNTER — Encounter: Payer: Self-pay | Admitting: Internal Medicine

## 2020-05-16 NOTE — Progress Notes (Signed)
Pulmonary Critical Care Medicine [facility] [place]   PULMONARY CRITICAL CARE SERVICE  PROGRESS NOTE  Date of Service: 05/16/2020  Dennis Howe  [id number]  [name]: 1976/02/18   DOA: 04/29/2020  Referring Physician: Carron Curie, MD  HPI: Dennis Howe is a 44 y.o. male seen for follow up of Acute on Chronic Respiratory Failure.  Patient is capping will be completing 48 hours and is ready for decannulation  Medications: Reviewed on Rounds  Physical Exam:  Vitals: Temperature 97.4 pulse 72 respiratory rate 16 blood pressure is 134/82 saturations 99%  Ventilator Settings capping right now on 48 hours.  . General: Comfortable at this time . Eyes: Grossly normal lids, irises & conjunctiva . ENT: grossly tongue is normal . Neck: no obvious mass . Cardiovascular: S1 S2 normal no gallop . Respiratory: No rhonchi no rales are noted at this time . Abdomen: soft . Skin: no rash seen on limited exam . Musculoskeletal: not rigid . Psychiatric:unable to assess . Neurologic: no seizure no involuntary movements         Lab Data:   Basic Metabolic Panel: Recent Labs  Lab 05/10/20 0445 05/14/20 0633  NA 137 139  K 4.4 4.1  CL 98 100  CO2 30 27  GLUCOSE 206* 146*  BUN 17 20  CREATININE 0.44* 0.41*  CALCIUM 9.1 8.8*    ABG: Recent Labs  Lab 05/11/20 1149  PHART 7.408  PCO2ART 54.1*  PO2ART 76.7*  HCO3 33.5*  O2SAT 95.5    Liver Function Tests: No results for input(s): AST, ALT, ALKPHOS, BILITOT, PROT, ALBUMIN in the last 168 hours. No results for input(s): LIPASE, AMYLASE in the last 168 hours. No results for input(s): AMMONIA in the last 168 hours.  CBC: Recent Labs  Lab 05/10/20 0445 05/14/20 0633  WBC 11.5* 10.1  HGB 12.9* 13.5  HCT 40.1 41.5  MCV 92.2 90.8  PLT 297 255    Cardiac Enzymes: No results for input(s): CKTOTAL, CKMB, CKMBINDEX, TROPONINI in the last 168 hours.  BNP (last 3 results) Recent Labs     03/30/20 0436  BNP 103.0*    ProBNP (last 3 results) No results for input(s): PROBNP in the last 8760 hours.  Radiological Exams: No results found.  Assessment/Plan Active Problems:   Acute on chronic respiratory failure with hypoxia (HCC)   COVID-19 virus infection   Pneumonia due to COVID-19 virus   Bilateral pneumothoraces   1. Acute on chronic respiratory failure hypoxia plan is to proceed to decannulation 2. COVID-19 virus infection in recovery we will continue to follow along 3. Pneumonia due to COVID-19 treated we will continue with supportive care 4. Bilateral pneumothoraces no change in resolution   I have personally seen and evaluated the patient, evaluated laboratory and imaging results, formulated the assessment and plan and placed orders. The Patient requires high complexity decision making with multiple systems involvement.  Rounds were done with the Respiratory Therapy Director and Staff therapists and discussed with nursing staff also.  Yevonne Pax, MD [facility] Pulmonary Critical Care Medicine Sleep Medicine

## 2020-05-17 LAB — NOVEL CORONAVIRUS, NAA (HOSP ORDER, SEND-OUT TO REF LAB; TAT 18-24 HRS): SARS-CoV-2, NAA: NOT DETECTED

## 2020-05-21 LAB — BASIC METABOLIC PANEL
Anion gap: 11 (ref 5–15)
BUN: 9 mg/dL (ref 6–20)
CO2: 25 mmol/L (ref 22–32)
Calcium: 8.5 mg/dL — ABNORMAL LOW (ref 8.9–10.3)
Chloride: 100 mmol/L (ref 98–111)
Creatinine, Ser: 0.52 mg/dL — ABNORMAL LOW (ref 0.61–1.24)
GFR, Estimated: >60 mL/min (ref >60)
Glucose, Bld: 96 mg/dL (ref 70–99)
Potassium: 3.4 mmol/L — ABNORMAL LOW (ref 3.5–5.1)
Sodium: 136 mmol/L (ref 135–145)

## 2020-05-21 LAB — CBC
HCT: 40.3 % (ref 39.0–52.0)
Hemoglobin: 13.3 g/dL (ref 13.0–17.0)
MCH: 29.4 pg (ref 26.0–34.0)
MCHC: 33 g/dL (ref 30.0–36.0)
MCV: 89.2 fL (ref 80.0–100.0)
Platelets: 217 10*3/uL (ref 150–400)
RBC: 4.52 MIL/uL (ref 4.22–5.81)
RDW: 15.6 % — ABNORMAL HIGH (ref 11.5–15.5)
WBC: 7.8 10*3/uL (ref 4.0–10.5)
nRBC: 0 % (ref 0.0–0.2)

## 2020-06-12 ENCOUNTER — Other Ambulatory Visit (HOSPITAL_COMMUNITY): Payer: Self-pay | Admitting: Physician Assistant

## 2020-06-12 ENCOUNTER — Telehealth (HOSPITAL_COMMUNITY): Payer: Self-pay | Admitting: Radiology

## 2020-06-12 DIAGNOSIS — R633 Feeding difficulties, unspecified: Secondary | ICD-10-CM

## 2020-06-12 NOTE — Telephone Encounter (Signed)
Called pt, left VM for him to call to schedule PEG removal for sometime after Jan. 10, 2022. JM

## 2020-06-21 ENCOUNTER — Encounter: Payer: Self-pay | Admitting: Gastroenterology

## 2020-07-04 ENCOUNTER — Ambulatory Visit (HOSPITAL_COMMUNITY)
Admission: RE | Admit: 2020-07-04 | Discharge: 2020-07-04 | Disposition: A | Discharge status: Home / Self Care | Payer: 59 | Source: Ambulatory Visit | Attending: Physician Assistant | Admitting: Physician Assistant

## 2020-07-04 DIAGNOSIS — R633 Feeding difficulties, unspecified: Secondary | ICD-10-CM

## 2020-07-04 DIAGNOSIS — Z431 Encounter for attention to gastrostomy: Secondary | ICD-10-CM | POA: Insufficient documentation

## 2020-07-04 DIAGNOSIS — R1310 Dysphagia, unspecified: Secondary | ICD-10-CM | POA: Insufficient documentation

## 2020-07-04 HISTORY — PX: IR GASTROSTOMY TUBE REMOVAL: IMG5492

## 2020-07-04 MED ORDER — LIDOCAINE VISCOUS HCL 2 % MT SOLN
OROMUCOSAL | Status: DC | PRN
  Administered 2020-07-04: 10 mL via OROMUCOSAL

## 2020-07-04 MED ORDER — LIDOCAINE VISCOUS HCL 2 % MT SOLN
OROMUCOSAL | Status: AC
  Filled 2020-07-04: qty 15

## 2020-07-04 NOTE — Procedures (Signed)
Pre-procedure diagnosis: Dysphagia Post-procedure diagnosis: Same  Successful bedside removal of intact pull through 20 Fr G-tube. No immediate post procedural complications. EBL: < 2 mL.  Patient to remain NPO x 2 hours then trial liquids - if leakage from insertion site change dressing and repeat NPO x 2 hours.   Please see imaging section of Epic for full dictation.  Villa Herb 07/04/2020 2:15 PM

## 2020-07-07 ENCOUNTER — Ambulatory Visit: Payer: 59 | Admitting: Internal Medicine

## 2020-07-11 ENCOUNTER — Ambulatory Visit: Payer: 59 | Admitting: Gastroenterology

## 2020-07-13 ENCOUNTER — Encounter: Payer: Self-pay | Admitting: Pulmonary Disease

## 2020-07-13 ENCOUNTER — Ambulatory Visit: Payer: 59 | Admitting: Pulmonary Disease

## 2020-07-13 ENCOUNTER — Other Ambulatory Visit: Payer: Self-pay

## 2020-07-13 VITALS — BP 130/72 | HR 102 | Temp 97.6°F | Ht 73.0 in | Wt 182.6 lb

## 2020-07-13 DIAGNOSIS — U071 COVID-19: Secondary | ICD-10-CM

## 2020-07-13 NOTE — Patient Instructions (Signed)
I am so glad to see you in office today and that you have improved since your hospitalization are doing much better  We will schedule high-resolution CT and PFTs for better evaluation of your lungs Referred to pulmonary rehab for post COVID-19  Follow-up in 3 months.

## 2020-07-13 NOTE — Progress Notes (Signed)
Dennis Howe    [id number]    1976/01/17  Primary Care Physician:Hijazi, Karie Mainland, MD  Referring Physician: Carron Curie, MD 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  He received COVID-related treatment including steroids, actemra and remdesivir. He was intubated 10/9 and had multiple chest tubes placed 10/10 for management of bilateral penumothoraces. On 10/11 he was transferred from [facility] ICU to [facility] ICU for consideration of ECMO but he did not get put on it as he had some improvement with prone ventilation.  He and eventually had a tracheostomy.  [facility] dec[facility]ulated in December 2022.  He is currently at home, working with PT.  He is off supplemental oxygen.  He notes transient desats to the mid 80s when exercising.    Pets: Dogs Occupation: Corporate investment banker Exposures: No exposure to asbestos Smoking history: Never smoker Travel history: Immigrated from [place] in 1996.  No significant recent travel Relevant family history: No family history of lung disease  Outpatient Encounter Medications as of 07/13/2020  Medication Sig  . cefdinir (OMNICEF) 250 MG/5ML suspension Place 6 mLs (300 mg total) into feeding tube 2 (two) times daily.  . clonazePAM (KLONOPIN) 1 MG disintegrating tablet Place 1 tablet (1 mg total) into feeding tube 2 (two) times daily.  Marland Kitchen enoxaparin (LOVENOX) 40 MG/0.4ML injection Inject 0.4 mLs (40 mg total) into the skin daily.  . fiber (NUTRISOURCE FIBER) PACK packet Place 1 packet into feeding tube 2 (two) times daily.  . insulin aspart (NOVOLOG) 100 UNIT/ML injection Inject 0-15 Units into the skin every 4 (four) hours.  . metoprolol tartrate (LOPRESSOR) 25 mg/10 mL SUSP Place 5 mLs (12.5 mg total) into feeding tube 3 (three) times daily.  . Nutritional Supplements (FEEDING SUPPLEMENT, PROSOURCE  TF,) liquid Place 45 mLs into feeding tube 5 (five) times daily.  . Nutritional Supplements (FEEDING SUPPLEMENT, VITAL 1.5 CAL,) LIQD Place 1,000 mLs into feeding tube continuous.  Marland Kitchen oxyCODONE (ROXICODONE) 5 MG/5ML solution Place 5 mLs (5 mg total) into feeding tube every 6 (six) hours.  . pantoprazole sodium (PROTONIX) 40 mg/20 mL PACK Place 20 mLs (40 mg total) into feeding tube daily at 12 noon.  . QUEtiapine (SEROQUEL) 200 MG tablet Place 1 tablet (200 mg total) into feeding tube 2 (two) times daily.   No facility-administered encounter medications on file as of 07/13/2020.    Allergies as of 07/13/2020  . (No Known Allergies)    Past Medical History:  Diagnosis Date  . Acute on chronic respiratory failure with hypoxia (HCC)   . Bilateral pneumothoraces   . BMI 30.0-30.9,adult   . COVID-19 virus infection   . Pneumonia due to COVID-19 virus     Past Surgical History:  Procedure Laterality Date  . APPENDECTOMY    . IR GASTROSTOMY TUBE MOD SED  05/03/2020  . IR GASTROSTOMY TUBE REMOVAL  07/04/2020    Family History  Problem Relation Age of Onset  . Diabetes Mother     Social History   Socioeconomic History  . Marital status: Single    Spouse name: Not on file  . Number of children: Not on file  . Years of education: Not on file  . Highest education level: Not on file  Occupational History  . Not on file  Tobacco Use  . Smoking status: Former  Smoker  . Smokeless tobacco: Never Used  . Tobacco comment: smoked as a teenager  Substance and Sexual Activity  . Alcohol use: Not on file  . Drug use: Not on file  . Sexual activity: Not on file  Other Topics Concern  . Not on file  Social History Narrative   ** Merged History Encounter **       Social Determinants of Health   Financial Resource Strain: Not on file  Food Insecurity: Not on file  Transportation Needs: Not on file  Physical Activity: Not on file  Stress: Not on file  Social Connections: Not on file   Intimate Partner Violence: Not on file    Review of systems: Review of Systems  Constitutional: Negative for fever and chills.  HENT: Negative.   Eyes: Negative for blurred vision.  Respiratory: as per HPI  Cardiovascular: Negative for chest pain and palpitations.  Gastrointestinal: Negative for vomiting, diarrhea, blood per rectum. Genitourinary: Negative for dysuria, urgency, frequency and hematuria.  Musculoskeletal: Negative for myalgias, back pain and joint pain.  Skin: Negative for itching and rash.  Neurological: Negative for dizziness, tremors, focal weakness, seizures and loss of consciousness.  Endo/Heme/Allergies: Negative for environmental allergies.  Psychiatric/Behavioral: Negative for depression, suicidal ideas and hallucinations.  All other systems reviewed and are negative.  Physical Exam: Blood pressure 130/72, pulse (!) 102, temperature 97.6 F (36.4 C), temperature source Oral, height 6\' 1"  (1.854 m), weight 182 lb 9.6 oz (82.8 kg), SpO2 98 %. Gen:      No acute distress HEENT:  EOMI, sclera anicteric Neck:     No masses; no thyromegaly Lungs:    Clear to auscultation bilaterally; normal respiratory effort CV:         Regular rate and rhythm; no murmurs Abd:      + bowel sounds; soft, non-tender; no palpable masses, no distension Ext:    No edema; adequate peripheral perfusion Skin:      Warm and dry; no rash Neuro: alert and oriented x 3 Psych: normal mood and affect  Data Reviewed: Imaging: Chest x-ray 05/14/2020- bibasal opacities, atelectasis.  PFTs:  Labs:  Assessment:  Post COVID-19 Has made a remarkable recovery after recent hospitalization Decannulated, on supplemental oxygen Continue PT Referral made to pulmonary rehab  Schedule high-res CT and PFTs for baseline evaluation.  Plan/Recommendations: High-res CT, PFTs  05/16/2020 MD [facility] 07/13/2020, 9:24 AM  CC: 07/15/2020, MD

## 2020-07-17 ENCOUNTER — Other Ambulatory Visit: Payer: 59

## 2020-07-17 ENCOUNTER — Other Ambulatory Visit: Payer: Self-pay

## 2020-07-17 ENCOUNTER — Other Ambulatory Visit (HOSPITAL_COMMUNITY)
Admission: RE | Admit: 2020-07-17 | Discharge: 2020-07-17 | Disposition: A | Discharge status: Home / Self Care | Payer: 59 | Source: Ambulatory Visit | Attending: Pulmonary Disease | Admitting: Pulmonary Disease

## 2020-07-17 ENCOUNTER — Ambulatory Visit (INDEPENDENT_AMBULATORY_CARE_PROVIDER_SITE_OTHER)
Admission: RE | Admit: 2020-07-17 | Discharge: 2020-07-17 | Disposition: A | Discharge status: Home / Self Care | Payer: 59 | Source: Ambulatory Visit | Attending: Pulmonary Disease | Admitting: Pulmonary Disease

## 2020-07-17 DIAGNOSIS — U071 COVID-19: Secondary | ICD-10-CM

## 2020-07-17 DIAGNOSIS — Z01812 Encounter for preprocedural laboratory examination: Secondary | ICD-10-CM | POA: Diagnosis not present

## 2020-07-17 LAB — SARS CORONAVIRUS 2 (TAT 6-24 HRS): SARS Coronavirus 2: POSITIVE — AB

## 2020-07-18 ENCOUNTER — Encounter (HOSPITAL_COMMUNITY): Payer: Self-pay | Admitting: *Deleted

## 2020-07-18 ENCOUNTER — Telehealth: Payer: Self-pay | Admitting: Physician Assistant

## 2020-07-18 ENCOUNTER — Telehealth: Payer: Self-pay | Admitting: Pulmonary Disease

## 2020-07-18 ENCOUNTER — Encounter: Payer: Self-pay | Admitting: Pulmonary Disease

## 2020-07-18 ENCOUNTER — Other Ambulatory Visit: Payer: Self-pay | Admitting: Physician Assistant

## 2020-07-18 DIAGNOSIS — J9610 Chronic respiratory failure, unspecified whether with hypoxia or hypercapnia: Secondary | ICD-10-CM

## 2020-07-18 DIAGNOSIS — U071 COVID-19: Secondary | ICD-10-CM

## 2020-07-18 NOTE — Telephone Encounter (Signed)
CT shows chronic post covid changes and scarring. We can continue to monitor this. No specific treatment is needed for it. I am not familiar with the [facility] breathing technique.  Regarding the positive test. I have asked that he be assessed for outpatient therapies like antibody infusions.

## 2020-07-18 NOTE — Progress Notes (Signed)
I connected by phone with Alphonse Guild on 07/18/2020 at 4:24 PM to discuss the potential use of a new treatment for mild to moderate COVID-19 viral infection in non-hospitalized patients.  This patient is a 45 y.o. male that meets the FDA criteria for Emergency Use Authorization of COVID monoclonal antibody sotrovimab.  Has a (+) direct SARS-CoV-2 viral test result  Has mild or moderate COVID-19   Is NOT hospitalized due to COVID-19  Is within 10 days of symptom onset  Has at least one of the high risk factor(s) for progression to severe COVID-19 and/or hospitalization as defined in EUA.  Specific high risk criteria : Chronic Lung Disease   I have spoken and communicated the following to the patient or parent/caregiver regarding COVID monoclonal antibody treatment:  1. FDA has authorized the emergency use for the treatment of mild to moderate COVID-19 in adults and pediatric patients with positive results of direct SARS-CoV-2 viral testing who are 64 years of age and older weighing at least 40 kg, and who are at high risk for progressing to severe COVID-19 and/or hospitalization.  2. The significant known and potential risks and benefits of COVID monoclonal antibody, and the extent to which such potential risks and benefits are unknown.  3. Information on available alternative treatments and the risks and benefits of those alternatives, including clinical trials.  4. Patients treated with COVID monoclonal antibody should continue to self-isolate and use infection control measures (e.g., wear mask, isolate, social distance, avoid sharing personal items, clean and disinfect "high touch" surfaces, and frequent handwashing) according to CDC guidelines.   5. The patient or parent/caregiver has the option to accept or refuse COVID monoclonal antibody treatment.  After reviewing this information with the patient, the patient has agreed to receive one of the available covid 19 monoclonal  antibodies and will be provided an appropriate fact sheet prior to infusion.   [place], [place] 07/18/2020 4:24 PM

## 2020-07-18 NOTE — Progress Notes (Signed)
Patient with positive covid result. Contacted MD and informed of result.   

## 2020-07-18 NOTE — Telephone Encounter (Signed)
Called to discuss with patient about COVID-19 symptoms and the use of one of the available treatments for those with mild to moderate Covid symptoms and at a high risk of hospitalization.  Pt appears to qualify for outpatient treatment due to co-morbid conditions and/or a member of an at-risk group in accordance with the FDA Emergency Use Authorization.    Symptom onset: 07/13/20 Vaccinated: No Booster? No Immunocompromised? No  Patient with history of hypoxic respiratory failure secondary to COVID-19 on October 2021. He required hospitalization and intubation. He was then discharged to long-term facility. Currently he is doing well and has seen by Dr. Isaiah Serge on July 13, 2020.  After office visit, patient started to having cold like symptoms with cough and congestion. He took over-the-counter medication and currently feeling better. He has no other risk factor however he was very sick as above with last Covid infection. I will reach out to Dr. Isaiah Serge for further recommendation. Last day to receive monoclonal antibody is July 19, 2020.   [employer]

## 2020-07-18 NOTE — Telephone Encounter (Signed)
Yes. Can we get him in tomorrow for treatment?  Thanks Chilton Greathouse MD [facility] 07/18/2020, 4:09 PM

## 2020-07-18 NOTE — Progress Notes (Signed)
Received referral from Dr. Isaiah Serge for this pt to participate in pulmonary rehab with the the diagnosis of Post Covid - 19. Clinical review of of pt medical history/admission 04/2020 and follow up appt on 1/20 Pulmonary office note. Pt scheduled for PFT on 2/17 and as a part of this procedure patients are tested for Covid.  Pt with positive result.  Await response from Dr. Isaiah Serge.   P Pt appropriate for scheduling for Pulmonary rehab once he tests negative and his PT is nearing the end.  Will forward to support  verification of insurance eligibility/benefits and what to expect regarding scheduling. Alanson Aly, BSN Cardiac and Emergency planning/management officer

## 2020-07-18 NOTE — Telephone Encounter (Signed)
I spoke with the pt's spouse OK per DPR  She is aware of response per Dr Isaiah Serge  She is asking if they should keep the o2 in the home or if it's okay to d/c it  His sats have overall been doing well on RA- usually 95-97% He has had a few times when walking up stairs when his o2 drops to around 87% ra- not long to recover to above 90%  I advised with having covid again, may need to keep at least until we reassess him in 3 months  Please advise, thanks

## 2020-07-18 NOTE — Telephone Encounter (Signed)
07/18/20  Contacted patient and also spoke with patient spouse  There requesting high-resolution CT chest results that were completed on 07/17/2020  Dr. Isaiah Serge please advise  Patient recently tested positive for SARS-CoV-2 on 07/17/2020.  They are wondering what can be done to further assess his breathing as PFTs will not be delayed by at least 3 months  Patient is afebrile, denies wheezing, shortness of breath has improved.  Patient continues to have a persistent productive cough with clear phlegm.  They are wondering there is any other additional recommendations that may be beneficial.  They have researched the [facility] breathing technique.  They are wondering if Dr. Isaiah Serge would feel that this is beneficial?  Dr. Isaiah Serge please advise  Elisha Headland, FNP

## 2020-07-19 ENCOUNTER — Ambulatory Visit (HOSPITAL_COMMUNITY)
Admission: RE | Admit: 2020-07-19 | Discharge: 2020-07-19 | Disposition: A | Discharge status: Home / Self Care | Payer: 59 | Source: Ambulatory Visit | Attending: Pulmonary Disease | Admitting: Pulmonary Disease

## 2020-07-19 ENCOUNTER — Other Ambulatory Visit: Payer: Self-pay | Admitting: Family

## 2020-07-19 DIAGNOSIS — U071 COVID-19: Secondary | ICD-10-CM

## 2020-07-19 MED ORDER — SODIUM CHLORIDE 0.9 % IV SOLN: INTRAVENOUS | Status: DC | PRN

## 2020-07-19 MED ORDER — DIPHENHYDRAMINE HCL 50 MG/ML IJ SOLN
50.0000 mg | Freq: Once | INTRAMUSCULAR | Status: DC | PRN
Start: 2020-07-19 — End: 2020-07-20

## 2020-07-19 MED ORDER — SOTROVIMAB 500 MG/8ML IV SOLN
500.0000 mg | Freq: Once | INTRAVENOUS | Status: AC
  Administered 2020-07-19: 500 mg via INTRAVENOUS

## 2020-07-19 MED ORDER — EPINEPHRINE 0.3 MG/0.3ML IJ SOAJ: 0.3000 mg | Freq: Once | INTRAMUSCULAR | Status: DC | PRN

## 2020-07-19 MED ORDER — ALBUTEROL SULFATE HFA 108 (90 BASE) MCG/ACT IN AERS
2.0000 | INHALATION_SPRAY | Freq: Once | RESPIRATORY_TRACT | Status: DC | PRN
Start: 2020-07-19 — End: 2020-07-20

## 2020-07-19 MED ORDER — METHYLPREDNISOLONE SODIUM SUCC 125 MG IJ SOLR: 125.0000 mg | Freq: Once | INTRAMUSCULAR | Status: DC | PRN

## 2020-07-19 MED ORDER — FAMOTIDINE IN NACL 20-0.9 MG/50ML-% IV SOLN: 20.0000 mg | Freq: Once | INTRAVENOUS | Status: DC | PRN

## 2020-07-19 NOTE — Discharge Instructions (Signed)

## 2020-07-19 NOTE — Progress Notes (Signed)
Patient reviewed Fact Sheet for Patients, Parents, and Caregivers for Emergency Use Authorization (EUA) of sotrovimab for the Treatment of Coronavirus. Patient also reviewed and is agreeable to the estimated cost of treatment. Patient is agreeable to proceed.   

## 2020-07-19 NOTE — Progress Notes (Signed)
Diagnosis: COVID-19  Physician: Dr. Patrick Wright  Procedure: Covid Infusion Clinic Med: Sotrovimab infusion - Provided patient with sotrovimab fact sheet for patients, parents, and caregivers prior to infusion.   Complications: No immediate complications noted  Discharge: Discharged home    

## 2020-07-20 NOTE — Telephone Encounter (Signed)
I called and discussed with patient. Nothing further needed 

## 2020-07-25 ENCOUNTER — Telehealth (HOSPITAL_COMMUNITY): Payer: Self-pay

## 2020-07-25 NOTE — Telephone Encounter (Signed)
Pt insurance is active and benefits verified through [place] $25, DED 0/0 met, out of pocket $3,000/$430.86 met, co-insurance 0%. no pre-authorization required. Passport, [employer] 07/25/2020_0 :59am, REF# [id number]

## 2020-07-25 NOTE — Telephone Encounter (Signed)
Called patient to see if he is interested in the Pulmonary Rehab Program. Patient wife expressed pt was interest. Explained scheduling process and went over insurance, patient's wife verbalized understanding. Will contact patient for scheduling at a later time.

## 2020-08-07 ENCOUNTER — Other Ambulatory Visit (HOSPITAL_COMMUNITY): Payer: 59

## 2020-08-08 ENCOUNTER — Encounter: Payer: Self-pay | Admitting: Internal Medicine

## 2020-08-08 ENCOUNTER — Other Ambulatory Visit: Payer: Self-pay

## 2020-08-08 ENCOUNTER — Ambulatory Visit: Payer: 59 | Admitting: Internal Medicine

## 2020-08-08 VITALS — BP 126/84 | HR 76 | Temp 98.2°F | Ht 73.0 in | Wt 191.0 lb

## 2020-08-08 DIAGNOSIS — J9621 Acute and chronic respiratory failure with hypoxia: Secondary | ICD-10-CM

## 2020-08-08 DIAGNOSIS — R7303 Prediabetes: Secondary | ICD-10-CM | POA: Insufficient documentation

## 2020-08-08 DIAGNOSIS — R7989 Other specified abnormal findings of blood chemistry: Secondary | ICD-10-CM | POA: Diagnosis not present

## 2020-08-08 LAB — COMPREHENSIVE METABOLIC PANEL
ALT: 31 U/L (ref 0–53)
AST: 23 U/L (ref 0–37)
Albumin: 4.4 g/dL (ref 3.5–5.2)
Alkaline Phosphatase: 75 U/L (ref 39–117)
BUN: 10 mg/dL (ref 6–23)
CO2: 30 mEq/L (ref 19–32)
Calcium: 10 mg/dL (ref 8.4–10.5)
Chloride: 102 mEq/L (ref 96–112)
Creatinine, Ser: 0.75 mg/dL (ref 0.40–1.50)
GFR: 111.17 mL/min (ref >60.00)
Glucose, Bld: 103 mg/dL — ABNORMAL HIGH (ref 70–99)
Potassium: 4.4 mEq/L (ref 3.5–5.1)
Sodium: 137 mEq/L (ref 135–145)
Total Bilirubin: 0.3 mg/dL (ref 0.2–1.2)
Total Protein: 8.1 g/dL (ref 6.0–8.3)

## 2020-08-08 LAB — CBC
HCT: 43.3 % (ref 39.0–52.0)
Hemoglobin: 14.3 g/dL (ref 13.0–17.0)
MCHC: 33 g/dL (ref 30.0–36.0)
MCV: 88.4 fl (ref 78.0–100.0)
Platelets: 262 10*3/uL (ref 150.0–400.0)
RBC: 4.89 Mil/uL (ref 4.22–5.81)
RDW: 14 % (ref 11.5–15.5)
WBC: 8.6 10*3/uL (ref 4.0–10.5)

## 2020-08-08 NOTE — Assessment & Plan Note (Signed)
HgA1c 6.2 in the hospital and we talked about need for monitoring every 6 months. He did have high dose steroids during Covid-19 treatment fall 2021.

## 2020-08-08 NOTE — Patient Instructions (Addendum)
April 26th you can do the covid-19 vaccine.  We are checking labs and will let you know about results.   We will get rid of the oxygen in the home.

## 2020-08-08 NOTE — Progress Notes (Signed)
Subjective:   Patient ID: Dennis Howe, male    [name]: 20-Feb-1976, 45 y.o.   [name]: [id number]  HPI The patient is a new 45 YO man coming in for concerns about ongoing chronic respiratory failure. Did have severe case of covid-19 with intubation and trach and chest tubes Oct 2021. Was severely sick and needed feeding tube which was removed mid Jan 2022. Was supposed to have PFTs done Jan 2022 but was detected for covid-19 and it was unclear if this was a new positive test or ongoing positivity. He was given monoclonal antibody treatment on 07/19/20 for this positive test. He is overall improving but slowly. He does not have any short term disability and is struggling as he is still unable to work. His wife is with him today and helps to provide history also. He is now walking around flat ground without oxygen and levels staying in the 90s. He is able to go up and down stairs and lowest levels are 88%. Is still having pain in the area of the chest tubes on the right. Recent CT scan done and they have seen the results but have questions about this. PFTs have been moved back to April. Has seen pulmonary in follow up mid January 2022. Still gets very fatigued with doing activity. Planning to start pulmonary rehab soon.  PMH, FMH, social history reviewed and updated  Review of Systems  Constitutional: Positive for activity change, appetite change, fatigue and unexpected weight change.  HENT: Negative.   Eyes: Negative.   Respiratory: Positive for shortness of breath. Negative for cough and chest tightness.   Cardiovascular: Positive for chest pain. Negative for palpitations and leg swelling.  Gastrointestinal: Negative for abdominal distention, abdominal pain, constipation, diarrhea, nausea and vomiting.  Musculoskeletal: Negative.   Skin: Negative.   Neurological: Negative.   Psychiatric/Behavioral: Negative.     Objective:  Physical Exam Constitutional:      Appearance: He is  well-developed and well-nourished.  HENT:     Head: Normocephalic and atraumatic.  Eyes:     Extraocular Movements: EOM normal.  Cardiovascular:     Rate and Rhythm: Normal rate and regular rhythm.  Pulmonary:     Effort: Pulmonary effort is normal. No respiratory distress.     Breath sounds: Normal breath sounds. No wheezing or rales.     Comments: Scars on the right chest wall from chest tube placements, no signs of infection, trach scar healed without infection Chest:     Chest wall: Tenderness present.  Abdominal:     General: Bowel sounds are normal. There is no distension.     Palpations: Abdomen is soft.     Tenderness: There is no abdominal tenderness. There is no rebound.     Comments: Scar midline from g-tube which is healing and no signs of infection or drainage  Musculoskeletal:        General: No edema.     Cervical back: Normal range of motion.  Skin:    General: Skin is warm and dry.  Neurological:     Mental Status: He is alert and oriented to person, place, and time.     Coordination: Coordination normal.  Psychiatric:        Mood and Affect: Mood and affect normal.     Vitals:   08/08/20 0826  BP: 126/84  Pulse: 76  Temp: 98.2 F (36.8 C)  TempSrc: Oral  SpO2: 96%  Weight: 191 lb (86.6 kg)  Height: 6\' 1"  (1.854  m)    This visit occurred during the SARS-CoV-2 public health emergency.  Safety protocols were in place, including screening questions prior to the visit, additional usage of staff PPE, and extensive cleaning of exam room while observing appropriate contact time as indicated for disinfecting solutions.   Assessment & Plan:  Visit time 35 minutes in face to face communication with patient and coordination of care, additional 15 minutes spent in record review, coordination or care, ordering tests, communicating/referring to other healthcare professionals, documenting in medical records all on the same day of the visit for total time 50 minutes  spent on the visit.

## 2020-08-08 NOTE — Progress Notes (Signed)
Walking Test preformed for 3-23min in office.  o2 started at 98, held at 95 for majority of the walk and dropped down to 93 at the conclusion of the walk.

## 2020-08-08 NOTE — Assessment & Plan Note (Addendum)
Currently off all medications he was previously taking in the hospital. We have updated his medication list accordingly. He is not requiring oxygen with activity at this time and will D/C oxygen from the home. Given rx today which was also [facility] to [facility]. Minimal O2 saturation with 5 minute walk test 93% in office today. Encouraged pulmonary rehab and then follow up with pulmonary and PFTs April. Advised can get covid-19 vaccine October 17, 2020 or later and strongly encouraged this. He does intend to get as soon as possible.

## 2020-08-08 NOTE — Assessment & Plan Note (Signed)
Checking CMP today for resolution.

## 2020-08-09 ENCOUNTER — Encounter: Payer: Self-pay | Admitting: Internal Medicine

## 2020-08-24 ENCOUNTER — Ambulatory Visit: Payer: 59 | Admitting: Internal Medicine

## 2020-08-24 ENCOUNTER — Encounter: Payer: Self-pay | Admitting: Internal Medicine

## 2020-08-24 ENCOUNTER — Other Ambulatory Visit: Payer: Self-pay

## 2020-08-24 DIAGNOSIS — J9621 Acute and chronic respiratory failure with hypoxia: Secondary | ICD-10-CM | POA: Diagnosis not present

## 2020-08-24 NOTE — Progress Notes (Signed)
   Subjective:   Patient ID: Dennis Howe, male    [name]: 1976/06/06, 45 y.o.   [name]: [id number]  HPI The patient is a 45 YO man coming in for concerns about high BP readings during panic episodes. He has had several recently as he is becoming more active and mobile. Having some pains in the chest region which is scary for him and being unsure if he can push endurance due to worrying about lung collapsing again. Denies change in breathing. Home O2 readings 96% mostly. Denies fevers or chills. Is using some breathing techniques for most recent panic and this did help it resolve quicker. BP readings when high are 144/80s highest. No history of high blood pressure. They are checking 3 times a day or so and most average 118/70.   Review of Systems  Constitutional: Negative.   HENT: Negative.   Eyes: Negative.   Respiratory: Positive for shortness of breath. Negative for cough and chest tightness.   Cardiovascular: Positive for chest pain. Negative for palpitations and leg swelling.  Gastrointestinal: Negative for abdominal distention, abdominal pain, constipation, diarrhea, nausea and vomiting.  Musculoskeletal: Negative.   Skin: Negative.   Neurological: Negative.   Psychiatric/Behavioral: The patient is nervous/anxious.     Objective:  Physical Exam Constitutional:      Appearance: He is well-developed and well-nourished.  HENT:     Head: Normocephalic and atraumatic.  Eyes:     Extraocular Movements: EOM normal.  Cardiovascular:     Rate and Rhythm: Normal rate and regular rhythm.  Pulmonary:     Effort: Pulmonary effort is normal. No respiratory distress.     Breath sounds: No wheezing or rales.     Comments: Stable lung exam , decreased air movement right lung, trach scar present no drainage or erythema Abdominal:     General: Bowel sounds are normal. There is no distension.     Palpations: Abdomen is soft.     Tenderness: There is no abdominal tenderness. There is no  rebound.  Musculoskeletal:        General: No edema.     Cervical back: Normal range of motion.  Skin:    General: Skin is warm and dry.  Neurological:     Mental Status: He is alert and oriented to person, place, and time.     Coordination: Coordination normal.  Psychiatric:        Mood and Affect: Mood and affect normal.     Vitals:   08/24/20 1043  BP: 126/90  Pulse: 88  Resp: 18  Temp: 97.8 F (36.6 C)  TempSrc: Oral  SpO2: 99%  Weight: 187 lb 3.2 oz (84.9 kg)  Height: 6\' 1"  (1.854 m)    This visit occurred during the SARS-CoV-2 public health emergency.  Safety protocols were in place, including screening questions prior to the visit, additional usage of staff PPE, and extensive cleaning of exam room while observing appropriate contact time as indicated for disinfecting solutions.   Assessment & Plan:

## 2020-08-24 NOTE — Assessment & Plan Note (Signed)
We talked about them trying to find a support group online for post covid-19. He is appropriately trying to increase endure and has appropriate fear about his lungs. Reassurance given today and also with BP readings that these are normal. No LVH on prior EKG from November so we will plan to monitor for now.

## 2020-09-04 ENCOUNTER — Other Ambulatory Visit: Payer: Self-pay

## 2020-09-04 ENCOUNTER — Encounter (HOSPITAL_COMMUNITY)
Admission: RE | Admit: 2020-09-04 | Discharge: 2020-09-04 | Disposition: A | Discharge status: Home / Self Care | Payer: 59 | Source: Ambulatory Visit | Attending: Pulmonary Disease | Admitting: Pulmonary Disease

## 2020-09-04 VITALS — BP 110/64 | HR 71 | Ht 71.0 in | Wt 188.5 lb

## 2020-09-04 DIAGNOSIS — U099 Post covid-19 condition, unspecified: Secondary | ICD-10-CM | POA: Diagnosis present

## 2020-09-04 NOTE — Progress Notes (Signed)
Pulmonary Individual Treatment Plan  Patient Details  Name: Dennis Howe [name]: [id number] Date of Birth: 1976-04-03 Referring Provider:   Doristine Devoid Pulmonary Rehab Walk Test from 09/04/2020 in [facility]ARDIAC [facility]  Referring Provider Dr. 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  Past Medical History: Past Medical History:  Diagnosis Date  . Acute on chronic respiratory failure with hypoxia (HCC)   . Bilateral pneumothoraces   . BMI 30.0-30.9,adult   . COVID-19 virus infection   . GERD (gastroesophageal reflux disease)   . Pneumonia due to COVID-19 virus     Tobacco Use: Social History   Tobacco Use  Smoking Status Former Smoker  Smokeless Tobacco Never Used  Tobacco Comment   smoked as a teenager    Labs: Recent Hydrographic surveyor    Labs for ITP Cardiac and Pulmonary Rehab Latest Ref Rng & Units 04/17/2020 04/20/2020 04/22/2020 05/04/2020 05/11/2020   Trlycerides <150 mg/dL - - - - -   Hemoglobin A1c 4.8 - 5.6 % - - - 6.2(H) -   PHART 7.350 - 7.450 7.358 7.429 7.447 - 7.408   PCO2ART 32.0 - 48.0 mmHg 64.8(H) 57.2(H) 46.3 - 54.1(H)   HCO3 20.0 - 28.0 mmol/L 35.5(H) 37.5(H) 32.0(H) - 33.5(H)   TCO2 22 - 32 mmol/L 37(H) 39(H) 33(H) - -   ACIDBASEDEF 0.0 - 2.0 mmol/L - - - - -   O2SAT % 90.0 94.0 87.0 - 95.5      Capillary Blood Glucose: Lab Results  Component Value Date   GLUCAP 145 (H) 04/29/2020   GLUCAP 159 (H) 04/29/2020   GLUCAP 139 (H) 04/29/2020   GLUCAP 119 (H) 04/29/2020   GLUCAP 132 (H) 04/28/2020     Pulmonary Assessment Scores:  Pulmonary Assessment Scores    Row Name 09/04/20 1053         ADL UCSD   ADL Phase Entry     SOB Score total 10            CAT Score   CAT Score 14           UCSD: Self-administered rating of dyspnea associated with activities of daily living (ADLs) 6-point scale (0 = "not at all" to 5 = "maximal or unable to do because of breathlessness")  Scoring Scores range from 0 to 120.  Minimally important difference is 5 units  CAT: CAT can identify the health impairment of COPD patients and is better correlated with disease progression.  CAT has a scoring range of zero to 40. The CAT score is classified into four groups of low (less than 10), medium (10 - 20), high (21-30) and very high (31-40) based on the impact level of disease on health status. A CAT score over 10 suggests significant symptoms.  A worsening CAT score could be explained by an exacerbation, poor medication adherence, poor inhaler technique, or progression of COPD or comorbid conditions.  CAT MCID is 2 points  mMRC: mMRC (Modified Medical Research Council) Dyspnea Scale is used to assess the degree of baseline functional disability in patients of respiratory disease due to dyspnea. No minimal important difference is established. A decrease in score of 1 point or greater is considered a positive change.  Pulmonary Function Assessment:  Pulmonary Function Assessment - 09/04/20 0938      Breath   Bilateral Breath Sounds Clear    Shortness of Breath Yes;Limiting activity           Exercise Target Goals: Exercise Program Goal: Individual exercise prescription set using results from initial 6 min walk test and THRR while considering  patient's activity barriers and safety.   Exercise Prescription Goal: Initial exercise prescription builds to 30-45 minutes a day of aerobic activity, 2-3 days per week.  Home exercise guidelines will be given to patient during program as part of exercise prescription that the participant will acknowledge.  Activity Barriers & Risk Stratification:  Activity Barriers & Cardiac Risk Stratification - 09/04/20  0931      Activity Barriers & Cardiac Risk Stratification   Activity Barriers Deconditioning;Muscular Weakness;Shortness of Breath           6 Minute Walk:  6 Minute Walk    Row Name 09/04/20 1019         6 Minute Walk   Phase Initial     Distance 1716 feet     Walk Time 6 minutes     # of Rest Breaks 0     MPH 3.25     METS 5.04     RPE 10     Perceived Dyspnea  1     VO2 Peak 17.65     Symptoms No     Resting HR 69 bpm     Resting BP 110/76     Resting Oxygen Saturation  97 %     Exercise Oxygen Saturation  during 6 min walk 91 %     Max Ex. HR 93 bpm     Max Ex. BP 104/64     2 Minute Post BP 102/70           Interval HR   1 Minute HR 91     2 Minute HR 93     3 Minute HR 90     4 Minute HR 90     5 Minute HR 90     6 Minute HR 93     2 Minute Post HR 69     Interval Heart Rate? Yes           Interval Oxygen   Interval Oxygen? Yes     Baseline Oxygen Saturation % 97 %     1 Minute Oxygen Saturation % 94 %     1 Minute Liters of Oxygen 0 L     2 Minute Oxygen Saturation % 93 %     2 Minute Liters of Oxygen 0 L     3 Minute Oxygen Saturation % 92 %     3 Minute Liters of Oxygen 0 L     4 Minute Oxygen Saturation % 91 %     4 Minute Liters of Oxygen 0 L     5 Minute Oxygen Saturation % 93 %     5 Minute Liters of Oxygen 0 L     6 Minute Oxygen Saturation % 93 %     6 Minute Liters of Oxygen 0 L     2 Minute Post Oxygen Saturation % 98 %     2 Minute Post Liters of Oxygen 0 L            Oxygen Initial Assessment:  Oxygen Initial Assessment - 09/04/20 0938      Home Oxygen  Home Oxygen Device None    Sleep Oxygen Prescription None    Home Exercise Oxygen Prescription None    Home Resting Oxygen Prescription None      Initial 6 min Walk   Oxygen Used None      Program Oxygen Prescription   Program Oxygen Prescription None      Intervention   Short Term Goals To learn and understand importance of monitoring SPO2 with pulse oximeter and  demonstrate accurate use of the pulse oximeter.;To learn and understand importance of maintaining oxygen saturations>88%;To learn and demonstrate proper pursed lip breathing techniques or other breathing techniques.;To learn and demonstrate proper use of respiratory medications    Long  Term Goals Verbalizes importance of monitoring SPO2 with pulse oximeter and return demonstration;Maintenance of O2 saturations>88%;Exhibits proper breathing techniques, such as pursed lip breathing or other method taught during program session;Compliance with respiratory medication;Demonstrates proper use of MDI's           Oxygen Re-Evaluation:   Oxygen Discharge (Final Oxygen Re-Evaluation):   Initial Exercise Prescription:  Initial Exercise Prescription - 09/04/20 1000      Date of Initial Exercise RX and Referring Provider   Date 09/04/20    Referring Provider Dr. Isaiah Serge    Expected Discharge Date 11/09/20      Treadmill   MPH 2.5    Grade 0    Minutes 15      Bike   Level 1.5    Minutes 15      Prescription Details   Frequency (times per week) 2    Duration Progress to 30 minutes of continuous aerobic without signs/symptoms of physical distress      Intensity   THRR 40-80% of Max Heartrate 71-142    Ratings of Perceived Exertion 11-13    Perceived Dyspnea 0-4      Progression   Progression Continue to progress workloads to maintain intensity without signs/symptoms of physical distress.      Resistance Training   Training Prescription Yes    Weight Blue bands    Reps 10-15           Perform Capillary Blood Glucose checks as needed.  Exercise Prescription Changes:   Exercise Comments:   Exercise Goals and Review:  Exercise Goals    Row Name 09/04/20 1018             Exercise Goals   Increase Physical Activity Yes       Intervention Provide advice, education, support and counseling about physical activity/exercise needs.;Develop an individualized exercise  prescription for aerobic and resistive training based on initial evaluation findings, risk stratification, comorbidities and participant's personal goals.       Expected Outcomes Short Term: Attend rehab on a regular basis to increase amount of physical activity.;Long Term: Add in home exercise to make exercise part of routine and to increase amount of physical activity.;Long Term: Exercising regularly at least 3-5 days a week.       Increase Strength and Stamina Yes       Intervention Provide advice, education, support and counseling about physical activity/exercise needs.;Develop an individualized exercise prescription for aerobic and resistive training based on initial evaluation findings, risk stratification, comorbidities and participant's personal goals.       Expected Outcomes Short Term: Increase workloads from initial exercise prescription for resistance, speed, and METs.;Short Term: Perform resistance training exercises routinely during rehab and add in resistance training at home;Long Term: Improve cardiorespiratory fitness, muscular endurance and strength as measured  by increased METs and functional capacity ( )       Able to understand and use rate of perceived exertion (RPE) scale Yes       Intervention Provide education and explanation on how to use RPE scale       Expected Outcomes Short Term: Able to use RPE daily in rehab to express subjective intensity level;Long Term:  Able to use RPE to guide intensity level when exercising independently       Able to understand and use Dyspnea scale Yes       Intervention Provide education and explanation on how to use Dyspnea scale       Expected Outcomes Short Term: Able to use Dyspnea scale daily in rehab to express subjective sense of shortness of breath during exertion;Long Term: Able to use Dyspnea scale to guide intensity level when exercising independently       Knowledge and understanding of Target Heart Rate Range (THRR) Yes        Intervention Provide education and explanation of THRR including how the numbers were predicted and where they are located for reference       Expected Outcomes Short Term: Able to state/look up THRR;Long Term: Able to use THRR to govern intensity when exercising independently;Short Term: Able to use daily as guideline for intensity in rehab       Understanding of Exercise Prescription Yes       Intervention Provide education, explanation, and written materials on patient's individual exercise prescription       Expected Outcomes Short Term: Able to explain program exercise prescription;Long Term: Able to explain home exercise prescription to exercise independently              Exercise Goals Re-Evaluation :   Discharge Exercise Prescription (Final Exercise Prescription Changes):   Nutrition:  Target Goals: Understanding of nutrition guidelines, daily intake of sodium 1500mg , cholesterol 200mg , calories 30% from fat and 7% or less from saturated fats, daily to have 5 or more servings of fruits and vegetables.  Biometrics:  Pre Biometrics - 09/04/20 0932      Pre Biometrics   Grip Strength 37.5 kg            Nutrition Therapy Plan and Nutrition Goals:   Nutrition Assessments:  MEDIFICTS Score Key:  ?70 Need to make dietary changes   40-70 Heart Healthy Diet  ? 40 Therapeutic Level Cholesterol Diet   Picture Your Plate Scores:  <16 Unhealthy dietary pattern with much room for improvement.  41-50 Dietary pattern unlikely to meet recommendations for good health and room for improvement.  51-60 More healthful dietary pattern, with some room for improvement.   >60 Healthy dietary pattern, although there may be some specific behaviors that could be improved.    Nutrition Goals Re-Evaluation:   Nutrition Goals Discharge (Final Nutrition Goals Re-Evaluation):   Psychosocial: Target Goals: Acknowledge presence or absence of significant depression and/or stress,  maximize coping skills, provide positive support system. Participant is able to verbalize types and ability to use techniques and skills needed for reducing stress and depression.  Initial Review & Psychosocial Screening:  Initial Psych Review & Screening - 09/04/20 1057      Initial Review   Current issues with Current Stress Concerns;Current Anxiety/Panic   He has had some panic attacks, but has been taught to handle it with deep breathing and mental imaging.   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      Family Dynamics   Good Support System? Yes   his wife and his faith are very important to him     Barriers   Psychosocial barriers to participate in program The patient should benefit from training in stress management and relaxation.      Screening Interventions   Interventions Encouraged to exercise;To provide support and resources with identified psychosocial needs           Quality of Life Scores:  Scores of 19 and below usually indicate a poorer quality of life in these areas.  A difference of  2-3 points is a clinically meaningful difference.  A difference of 2-3 points in the total score of the Quality of Life Index has been associated with significant improvement in overall quality of life, self-image, physical symptoms, and general health in studies assessing change in quality of life.  PHQ-9: Recent Review Flowsheet Data    Depression screen [facility] 2/9 09/04/2020 08/08/2020   Decreased Interest 0 0   Down, Depressed, Hopeless 0 -   PHQ - 2 Score 0 0   Altered sleeping 0 -   Tired, decreased energy 0 -   Change in appetite 0 -   Feeling bad or  failure about yourself  0 -   Trouble concentrating 0 -   Moving slowly or fidgety/restless 0 -     Interpretation of Total Score  Total Score Depression Severity:  1-4 = Minimal depression, 5-9 = Mild depression, 10-14 = Moderate depression, 15-19 = Moderately severe depression, 20-27 = Severe depression   Psychosocial Evaluation and Intervention:  Psychosocial Evaluation - 09/04/20 1102      Psychosocial Evaluation & Interventions   Interventions Relaxation education;Encouraged to exercise with the program and follow exercise prescription;Stress management education    Continue Psychosocial Services  Follow up required by staff           Psychosocial Re-Evaluation:   Psychosocial Discharge (Final Psychosocial Re-Evaluation):   Education: Education Goals: Education classes will be provided on a weekly basis, covering required topics. Participant will state understanding/return demonstration of topics presented.  Learning Barriers/Preferences:  Learning Barriers/Preferences - 09/04/20 1103      Learning Barriers/Preferences   Learning Barriers None    Learning Preferences Written Material           Education Topics: Risk Factor Reduction:  -Group instruction that is supported by a PowerPoint presentation. Instructor discusses the definition of a risk factor, different risk factors for pulmonary disease, and how the heart and lungs work together.     Nutrition for Pulmonary Patient:  -Group instruction provided by PowerPoint slides, verbal discussion, and written materials to support subject matter. The instructor gives an explanation and review of healthy diet recommendations, which includes a discussion on weight management, recommendations for fruit and vegetable consumption, as well as protein, fluid, caffeine, fiber, sodium, sugar, and alcohol. Tips for eating when patients are short of breath are discussed.   Pursed Lip Breathing:  -Group instruction that is  supported by demonstration and informational handouts. Instructor discusses the benefits of pursed lip and diaphragmatic breathing and detailed demonstration on how to preform both.     Oxygen Safety:  -Group instruction provided by PowerPoint, verbal discussion,  and written material to support subject matter. There is an overview of "What is Oxygen" and "Why do we need it".  Instructor also reviews how to create a safe environment for oxygen use, the importance of using oxygen as prescribed, and the risks of noncompliance. There is a brief discussion on traveling with oxygen and resources the patient may utilize.   Oxygen Equipment:  -Group instruction provided by [facility] Staff utilizing handouts, written materials, and equipment demonstrations.   Signs and Symptoms:  -Group instruction provided by written material and verbal discussion to support subject matter. Warning signs and symptoms of infection, stroke, and heart attack are reviewed and when to call the physician/911 reinforced. Tips for preventing the spread of infection discussed.   Advanced Directives:  -Group instruction provided by verbal instruction and written material to support subject matter. Instructor reviews Advanced Directive laws and proper instruction for filling out document.   Pulmonary Video:  -Group video education that reviews the importance of medication and oxygen compliance, exercise, good nutrition, pulmonary hygiene, and pursed lip and diaphragmatic breathing for the pulmonary patient.   Exercise for the Pulmonary Patient:  -Group instruction that is supported by a PowerPoint presentation. Instructor discusses benefits of exercise, core components of exercise, frequency, duration, and intensity of an exercise routine, importance of utilizing pulse oximetry during exercise, safety while exercising, and options of places to exercise outside of rehab.     Pulmonary Medications:  -Verbally interactive  group education provided by instructor with focus on inhaled medications and proper administration.   Anatomy and Physiology of the Respiratory System and Intimacy:  -Group instruction provided by PowerPoint, verbal discussion, and written material to support subject matter. Instructor reviews respiratory cycle and anatomical components of the respiratory system and their functions. Instructor also reviews differences in obstructive and restrictive respiratory diseases with examples of each. Intimacy, Sex, and Sexuality differences are reviewed with a discussion on how relationships can change when diagnosed with pulmonary disease. Common sexual concerns are reviewed.   MD DAY -A group question and answer session with a medical doctor that allows participants to ask questions that relate to their pulmonary disease state.   OTHER EDUCATION -Group or individual verbal, written, or video instructions that support the educational goals of the pulmonary rehab program.   Holiday Eating Survival Tips:  -Group instruction provided by PowerPoint slides, verbal discussion, and written materials to support subject matter. The instructor gives patients tips, tricks, and techniques to help them not only survive but enjoy the holidays despite the onslaught of food that accompanies the holidays.   Knowledge Questionnaire Score:  Knowledge Questionnaire Score - 09/04/20 1103      Knowledge Questionnaire Score   Pre Score 17/18           Core Components/Risk Factors/Patient Goals at Admission:  Personal Goals and Risk Factors at Admission - 09/04/20 1103      Core Components/Risk Factors/Patient Goals on Admission   Improve shortness of breath with ADL's Yes    Intervention Provide education, individualized exercise plan and daily activity instruction to help decrease symptoms of SOB with activities of daily living.    Expected Outcomes Short Term: Improve cardiorespiratory fitness to achieve a  reduction of symptoms when performing ADLs;Long Term: Be able to perform more ADLs without symptoms or delay the onset of symptoms    Stress Yes    Intervention Offer individual and/or small group education and counseling on adjustment to heart disease, stress management and health-related lifestyle change.  Teach and support self-help strategies.           Core Components/Risk Factors/Patient Goals Review:   Goals and Risk Factor Review    Row Name 09/04/20 1104             Core Components/Risk Factors/Patient Goals Review   Personal Goals Review Develop more efficient breathing techniques such as purse lipped breathing and diaphragmatic breathing and practicing self-pacing with activity.;Increase knowledge of respiratory medications and ability to use respiratory devices properly.;Improve shortness of breath with ADL's;Stress              Core Components/Risk Factors/Patient Goals at Discharge (Final Review):   Goals and Risk Factor Review - 09/04/20 1104      Core Components/Risk Factors/Patient Goals Review   Personal Goals Review Develop more efficient breathing techniques such as purse lipped breathing and diaphragmatic breathing and practicing self-pacing with activity.;Increase knowledge of respiratory medications and ability to use respiratory devices properly.;Improve shortness of breath with ADL's;Stress           ITP Comments:   Comments:

## 2020-09-04 NOTE — Progress Notes (Signed)
Dennis Howe 45 y.o. male Pulmonary Rehab Orientation Note This patient who was referred to Pulmonary rehab by Dr. Isaiah Serge with the diagnosis of post covid-19 conditioned, unspecified arrived today in Cardiac and Pulmonary Rehab. He arrived ambulatory with normal gait. He does not use oxygen at all. He was intubated during his hospitalized with covid-10 03/2020-05/2020 and went home on supplemental oxygen , but presently does not use it at all.  Color good, skin warm and dry. Patient is oriented to time and place. Patient's medical history, psychosocial health, and medications reviewed. Psychosocial assessment reveals pt lives with their family. 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 Pt hobbies include cooking. Pt reports his stress level is high. Areas of stress/anxiety include Health Work [employer].  His wife and his faith are his support system, he has experienced some anxiety attacks, but has learned to calm down with deep breathing and mental imagry. Pt does not exhibit signs of depression. PHQ2/9 score 0/0. Pt shows good  coping skills with positive outlook .  Offered emotional support and reassurance. Will continue to monitor and evaluate progress toward psychosocial goal(s) of continuing with healthy ways of handling stress. Physical assessment reveals heart rate is normal, breath sounds clear with crackles in the right base. Grip strength equal, strong. Distal pulses 3+ posterior tibial pulses present without peripheral edema. Patient reports he does not take any medications because he has not been prescribed any medications take medications as prescribed. Patient states he follows a Regular diet. He lost 50 pounds post covid and presently has gained 20 pounds back.. Patient's weight will be monitored closely. Demonstration and  practice of PLB using pulse oximeter. Patient able to return demonstration satisfactorily. Safety and hand hygiene in the exercise area reviewed with patient. Patient voices understanding of the information reviewed. Department expectations discussed with patient and achievable goals were set. The patient shows enthusiasm about attending the program and we look forward to working with this nice gentl. The patient completed  a 6 min walk test today, 09/04/2020 and to begin exercise on Tuesday, 09/12/2020 in the 1345 exercise slot.  [id number]

## 2020-09-12 ENCOUNTER — Encounter (HOSPITAL_COMMUNITY): Payer: 59

## 2020-09-12 ENCOUNTER — Telehealth (HOSPITAL_COMMUNITY): Payer: Self-pay | Admitting: Internal Medicine

## 2020-09-14 ENCOUNTER — Encounter (HOSPITAL_COMMUNITY)
Admission: RE | Admit: 2020-09-14 | Discharge: 2020-09-14 | Disposition: A | Discharge status: Home / Self Care | Payer: 59 | Source: Ambulatory Visit | Attending: Pulmonary Disease | Admitting: Pulmonary Disease

## 2020-09-14 ENCOUNTER — Other Ambulatory Visit: Payer: Self-pay

## 2020-09-14 DIAGNOSIS — U099 Post covid-19 condition, unspecified: Secondary | ICD-10-CM

## 2020-09-14 NOTE — Progress Notes (Signed)
Daily Session Note  Patient Details  Name: CACHE BILLS [name]: [id number] Date of Birth: 27-Oct-1975 Referring Provider:   April Manson Pulmonary Rehab Walk Test from 09/04/2020 in [place]  Referring Provider Dr. Vaughan Browner      Encounter Date: 09/14/2020  Check In:  Session Check In - 09/14/20 1426      Check-In   Supervising physician immediately available to respond to emergencies [facility] immediately available    Physician(s) Dr. Tawanna Solo    Location [facility]    Staff Present Rosebud Poles, RN, BSN;Asier Desroches Ysidro Evert, RN;Jessica Hassell Done, MS, ACSM-CEP, Exercise Physiologist    Virtual Visit No    Medication changes reported     No    Fall or balance concerns reported    No    Tobacco Cessation No Change    Warm-up and Cool-down Performed on first and last piece of equipment    Resistance Training Performed Yes    VAD Patient? No    PAD/SET Patient? No      Pain Assessment   Currently in Pain? No/denies    Multiple Pain Sites No           Capillary Blood Glucose: No results found for this or any previous visit (from the past 24 hour(s)).    Social History   Tobacco Use  Smoking Status Former Smoker  Smokeless Tobacco Never Used  Tobacco Comment   smoked as a teenager    Goals Met:  Exercise tolerated well No report of cardiac concerns or symptoms Strength training completed today  Goals Unmet:  Not Applicable  Comments: Service time is from 1330 to 1427    Dr. Fransico Him is Medical Director for Cardiac Rehab at [facility].

## 2020-09-19 ENCOUNTER — Other Ambulatory Visit: Payer: Self-pay

## 2020-09-19 ENCOUNTER — Encounter (HOSPITAL_COMMUNITY)
Admission: RE | Admit: 2020-09-19 | Discharge: 2020-09-19 | Disposition: A | Discharge status: Home / Self Care | Payer: 59 | Source: Ambulatory Visit | Attending: Pulmonary Disease | Admitting: Pulmonary Disease

## 2020-09-19 NOTE — Progress Notes (Signed)
Pulmonary Individual Treatment Plan  Patient Details  Name: Dennis Howe [name]: [id number] Date of Birth: 1976/04/04 Referring Provider:   April Manson Pulmonary Rehab Walk Test from 09/04/2020 in [place]  Referring Provider Dr. Vaughan Browner      Initial Encounter Date:  Flowsheet Row Pulmonary Rehab Walk Test from 09/04/2020 in [place]  Date 09/04/20      Visit Diagnosis: No diagnosis found.  Patient's Home Medications on Admission:  No current outpatient medications on file.  Past Medical History: Past Medical History:  Diagnosis Date  . Acute on chronic respiratory failure with hypoxia ([place])   . Bilateral pneumothoraces   . BMI 30.0-30.9,adult   . COVID-19 virus infection   . GERD (gastroesophageal reflux disease)   . Pneumonia due to COVID-19 virus     Tobacco Use: Social History   Tobacco Use  Smoking Status Former Smoker  Smokeless Tobacco Never Used  Tobacco Comment   smoked as a teenager    Labs: Recent Chemical engineer    Labs for ITP Cardiac and Pulmonary Rehab Latest Ref Rng & Units 04/17/2020 04/20/2020 04/22/2020 05/04/2020 05/11/2020   Trlycerides <150 mg/dL - - - - -   Hemoglobin A1c 4.8 - 5.6 % - - - 6.2(H) -   PHART 7.350 - 7.450 7.358 7.429 7.447 - 7.408   PCO2ART 32.0 - 48.0 mmHg 64.8(H) 57.2(H) 46.3 - 54.1(H)   HCO3 20.0 - 28.0 mmol/L 35.5(H) 37.5(H) 32.0(H) - 33.5(H)   TCO2 22 - 32 mmol/L 37(H) 39(H) 33(H) - -   ACIDBASEDEF 0.0 - 2.0 mmol/L - - - - -   O2SAT % 90.0 94.0 87.0 - 95.5      Capillary Blood Glucose: Lab Results  Component Value Date   GLUCAP 145 (H) 04/29/2020   GLUCAP 159 (H) 04/29/2020   GLUCAP 139 (H) 04/29/2020   GLUCAP 119 (H) 04/29/2020   GLUCAP 132 (H) 04/28/2020     Pulmonary Assessment Scores:  Pulmonary Assessment Scores    Row Name 09/04/20 1053         ADL UCSD   ADL Phase Entry     SOB Score total 10           CAT Score    CAT Score 14           UCSD: Self-administered rating of dyspnea associated with activities of daily living (ADLs) 6-point scale (0 = "not at all" to 5 = "maximal or unable to do because of breathlessness")  Scoring Scores range from 0 to 120.  Minimally important difference is 5 units  CAT: CAT can identify the health impairment of COPD patients and is better correlated with disease progression.  CAT has a scoring range of zero to 40. The CAT score is classified into four groups of low (less than 10), medium (10 - 20), high (21-30) and very high (31-40) based on the impact level of disease on health status. A CAT score over 10 suggests significant symptoms.  A worsening CAT score could be explained by an exacerbation, poor medication adherence, poor inhaler technique, or progression of COPD or comorbid conditions.  CAT MCID is 2 points  mMRC: mMRC (Modified Medical Research Council) Dyspnea Scale is used to assess the degree of baseline functional disability in patients of respiratory disease due to dyspnea. No minimal important difference is established. A decrease in score of 1 point or greater is considered a positive change.   Pulmonary  Function Assessment:  Pulmonary Function Assessment - 09/04/20 0938      Breath   Bilateral Breath Sounds Clear    Shortness of Breath Yes;Limiting activity           Exercise Target Goals: Exercise Program Goal: Individual exercise prescription set using results from initial 6 min walk test and THRR while considering  patient's activity barriers and safety.   Exercise Prescription Goal: Initial exercise prescription builds to 30-45 minutes a day of aerobic activity, 2-3 days per week.  Home exercise guidelines will be given to patient during program as part of exercise prescription that the participant will acknowledge.  Activity Barriers & Risk Stratification:  Activity Barriers & Cardiac Risk Stratification - 09/04/20 0931       Activity Barriers & Cardiac Risk Stratification   Activity Barriers Deconditioning;Muscular Weakness;Shortness of Breath           6 Minute Walk:  6 Minute Walk    Row Name 09/04/20 1019         6 Minute Walk   Phase Initial     Distance 1716 feet     Walk Time 6 minutes     # of Rest Breaks 0     MPH 3.25     METS 5.04     RPE 10     Perceived Dyspnea  1     VO2 Peak 17.65     Symptoms No     Resting HR 69 bpm     Resting BP 110/76     Resting Oxygen Saturation  97 %     Exercise Oxygen Saturation  during 6 min walk 91 %     Max Ex. HR 93 bpm     Max Ex. BP 104/64     2 Minute Post BP 102/70           Interval HR   1 Minute HR 91     2 Minute HR 93     3 Minute HR 90     4 Minute HR 90     5 Minute HR 90     6 Minute HR 93     2 Minute Post HR 69     Interval Heart Rate? Yes           Interval Oxygen   Interval Oxygen? Yes     Baseline Oxygen Saturation % 97 %     1 Minute Oxygen Saturation % 94 %     1 Minute Liters of Oxygen 0 L     2 Minute Oxygen Saturation % 93 %     2 Minute Liters of Oxygen 0 L     3 Minute Oxygen Saturation % 92 %     3 Minute Liters of Oxygen 0 L     4 Minute Oxygen Saturation % 91 %     4 Minute Liters of Oxygen 0 L     5 Minute Oxygen Saturation % 93 %     5 Minute Liters of Oxygen 0 L     6 Minute Oxygen Saturation % 93 %     6 Minute Liters of Oxygen 0 L     2 Minute Post Oxygen Saturation % 98 %     2 Minute Post Liters of Oxygen 0 L            Oxygen Initial Assessment:  Oxygen Initial Assessment - 09/04/20 0938      Home Oxygen  Home Oxygen Device None    Sleep Oxygen Prescription None    Home Exercise Oxygen Prescription None    Home Resting Oxygen Prescription None      Initial 6 min Walk   Oxygen Used None      Program Oxygen Prescription   Program Oxygen Prescription None      Intervention   Short Term Goals To learn and understand importance of monitoring SPO2 with pulse oximeter and demonstrate  accurate use of the pulse oximeter.;To learn and understand importance of maintaining oxygen saturations>88%;To learn and demonstrate proper pursed lip breathing techniques or other breathing techniques.;To learn and demonstrate proper use of respiratory medications    Long  Term Goals Verbalizes importance of monitoring SPO2 with pulse oximeter and return demonstration;Maintenance of O2 saturations>88%;Exhibits proper breathing techniques, such as pursed lip breathing or other method taught during program session;Compliance with respiratory medication;Demonstrates proper use of MDI's           Oxygen Re-Evaluation:  Oxygen Re-Evaluation    Row Name 09/19/20 1321             Program Oxygen Prescription   Program Oxygen Prescription None               Home Oxygen   Home Oxygen Device None       Sleep Oxygen Prescription None       Home Exercise Oxygen Prescription None       Home Resting Oxygen Prescription None               Goals/Expected Outcomes   Short Term Goals To learn and understand importance of monitoring SPO2 with pulse oximeter and demonstrate accurate use of the pulse oximeter.;To learn and understand importance of maintaining oxygen saturations>88%;To learn and demonstrate proper pursed lip breathing techniques or other breathing techniques.;To learn and demonstrate proper use of respiratory medications       Long  Term Goals Verbalizes importance of monitoring SPO2 with pulse oximeter and return demonstration;Maintenance of O2 saturations>88%;Exhibits proper breathing techniques, such as pursed lip breathing or other method taught during program session;Compliance with respiratory medication;Demonstrates proper use of MDI's       Goals/Expected Outcomes Compliance and understanding of oxygen saturation and pursed lip breathing.              Oxygen Discharge (Final Oxygen Re-Evaluation):  Oxygen Re-Evaluation - 09/19/20 1321      Program Oxygen Prescription    Program Oxygen Prescription None      Home Oxygen   Home Oxygen Device None    Sleep Oxygen Prescription None    Home Exercise Oxygen Prescription None    Home Resting Oxygen Prescription None      Goals/Expected Outcomes   Short Term Goals To learn and understand importance of monitoring SPO2 with pulse oximeter and demonstrate accurate use of the pulse oximeter.;To learn and understand importance of maintaining oxygen saturations>88%;To learn and demonstrate proper pursed lip breathing techniques or other breathing techniques.;To learn and demonstrate proper use of respiratory medications    Long  Term Goals Verbalizes importance of monitoring SPO2 with pulse oximeter and return demonstration;Maintenance of O2 saturations>88%;Exhibits proper breathing techniques, such as pursed lip breathing or other method taught during program session;Compliance with respiratory medication;Demonstrates proper use of MDI's    Goals/Expected Outcomes Compliance and understanding of oxygen saturation and pursed lip breathing.           Initial Exercise Prescription:  Initial Exercise Prescription - 09/04/20 1000  Date of Initial Exercise RX and Referring Provider   Date 09/04/20    Referring Provider Dr. Vaughan Browner    Expected Discharge Date 11/09/20      Treadmill   MPH 2.5    Grade 0    Minutes 15      Bike   Level 1.5    Minutes 15      Prescription Details   Frequency (times per week) 2    Duration Progress to 30 minutes of continuous aerobic without signs/symptoms of physical distress      Intensity   THRR 40-80% of Max Heartrate 71-142    Ratings of Perceived Exertion 11-13    Perceived Dyspnea 0-4      Progression   Progression Continue to progress workloads to maintain intensity without signs/symptoms of physical distress.      Resistance Training   Training Prescription Yes    Weight Blue bands    Reps 10-15           Perform Capillary Blood Glucose checks as  needed.  Exercise Prescription Changes:   Exercise Comments:   Exercise Goals and Review:  Exercise Goals    Row Name 09/04/20 1018             Exercise Goals   Increase Physical Activity Yes       Intervention Provide advice, education, support and counseling about physical activity/exercise needs.;Develop an individualized exercise prescription for aerobic and resistive training based on initial evaluation findings, risk stratification, comorbidities and participant's personal goals.       Expected Outcomes Short Term: Attend rehab on a regular basis to increase amount of physical activity.;Long Term: Add in home exercise to make exercise part of routine and to increase amount of physical activity.;Long Term: Exercising regularly at least 3-5 days a week.       Increase Strength and Stamina Yes       Intervention Provide advice, education, support and counseling about physical activity/exercise needs.;Develop an individualized exercise prescription for aerobic and resistive training based on initial evaluation findings, risk stratification, comorbidities and participant's personal goals.       Expected Outcomes Short Term: Increase workloads from initial exercise prescription for resistance, speed, and METs.;Short Term: Perform resistance training exercises routinely during rehab and add in resistance training at home;Long Term: Improve cardiorespiratory fitness, muscular endurance and strength as measured by increased METs and functional capacity (6MWT)       Able to understand and use rate of perceived exertion (RPE) scale Yes       Intervention Provide education and explanation on how to use RPE scale       Expected Outcomes Short Term: Able to use RPE daily in rehab to express subjective intensity level;Long Term:  Able to use RPE to guide intensity level when exercising independently       Able to understand and use Dyspnea scale Yes       Intervention Provide education and  explanation on how to use Dyspnea scale       Expected Outcomes Short Term: Able to use Dyspnea scale daily in rehab to express subjective sense of shortness of breath during exertion;Long Term: Able to use Dyspnea scale to guide intensity level when exercising independently       Knowledge and understanding of Target Heart Rate Range (THRR) Yes       Intervention Provide education and explanation of THRR including how the numbers were predicted and where they are located for reference  Expected Outcomes Short Term: Able to state/look up THRR;Long Term: Able to use THRR to govern intensity when exercising independently;Short Term: Able to use daily as guideline for intensity in rehab       Understanding of Exercise Prescription Yes       Intervention Provide education, explanation, and written materials on patient's individual exercise prescription       Expected Outcomes Short Term: Able to explain program exercise prescription;Long Term: Able to explain home exercise prescription to exercise independently              Exercise Goals Re-Evaluation :  Exercise Goals Re-Evaluation    Row Name 09/19/20 1319             Exercise Goal Re-Evaluation   Exercise Goals Review Increase Physical Activity;Increase Strength and Stamina;Able to understand and use rate of perceived exertion (RPE) scale;Able to understand and use Dyspnea scale;Knowledge and understanding of Target Heart Rate Range (THRR);Understanding of Exercise Prescription       Comments Patient has only completed 1 exercise session but tolerated that well with no complaints or concerns. It is too soon to note any changes or progression. Will continue to monitor and progress as he is able.       Expected Outcomes Through exercise at rehab and home, the patient will decrease shortness of breath with daily activities and feel confident in carrying out an exercise regimn at home.              Discharge Exercise Prescription  (Final Exercise Prescription Changes):   Nutrition:  Target Goals: Understanding of nutrition guidelines, daily intake of sodium '1500mg'$ , cholesterol '200mg'$ , calories 30% from fat and 7% or less from saturated fats, daily to have 5 or more servings of fruits and vegetables.  Biometrics:  Pre Biometrics - 09/04/20 0932      Pre Biometrics   Grip Strength 37.5 kg            Nutrition Therapy Plan and Nutrition Goals:   Nutrition Assessments:  MEDIFICTS Score Key:  ?70 Need to make dietary changes   40-70 Heart Healthy Diet  ? 40 Therapeutic Level Cholesterol Diet   Picture Your Plate Scores:  <02 Unhealthy dietary pattern with much room for improvement.  41-50 Dietary pattern unlikely to meet recommendations for good health and room for improvement.  51-60 More healthful dietary pattern, with some room for improvement.   >60 Healthy dietary pattern, although there may be some specific behaviors that could be improved.    Nutrition Goals Re-Evaluation:   Nutrition Goals Discharge (Final Nutrition Goals Re-Evaluation):   Psychosocial: Target Goals: Acknowledge presence or absence of significant depression and/or stress, maximize coping skills, provide positive support system. Participant is able to verbalize types and ability to use techniques and skills needed for reducing stress and depression.  Initial Review & Psychosocial Screening:  Initial Psych Review & Screening - 09/04/20 1057      Initial Review   Current issues with Current Stress Concerns;Current Anxiety/Panic   He has had some panic attacks, but has been taught to handle it with deep breathing and mental imaging.   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  Family Dynamics   Good Support System? Yes   his wife and his faith are very important to him     Barriers   Psychosocial barriers to participate in program The patient should benefit from training in stress management and relaxation.      Screening Interventions   Interventions Encouraged to exercise;To provide support and resources with identified psychosocial needs           Quality of Life Scores:  Scores of 19 and below usually indicate a poorer quality of life in these areas.  A difference of  2-3 points is a clinically meaningful difference.  A difference of 2-3 points in the total score of the Quality of Life Index has been associated with significant improvement in overall quality of life, self-image, physical symptoms, and general health in studies assessing change in quality of life.  PHQ-9: Recent Review Flowsheet Data    Depression screen [facility] 2/9 09/04/2020 08/08/2020   Decreased Interest 0 0   Down, Depressed, Hopeless 0 -   PHQ - 2 Score 0 0   Altered sleeping 0 -   Tired, decreased energy 0 -   Change in appetite 0 -   Feeling bad or failure about yourself  0 -   Trouble concentrating 0 -   Moving slowly or fidgety/restless 0 -     Interpretation of Total Score  Total Score Depression Severity:  1-4 = Minimal depression, 5-9 = Mild depression, 10-14 = Moderate depression, 15-19 = Moderately severe depression, 20-27 = Severe depression   Psychosocial Evaluation and Intervention:  Psychosocial Evaluation - 09/04/20 1102      Psychosocial Evaluation & Interventions   Interventions Relaxation education;Encouraged to exercise with the program and follow exercise prescription;Stress management education    Continue Psychosocial Services  Follow up required by staff           Psychosocial Re-Evaluation:  Psychosocial Re-Evaluation    [place] Name 09/18/20 1340              Psychosocial Re-Evaluation   Current issues with Current Stress Concerns;Current Anxiety/Panic       Comments Leandrew just started pulmonary rehab, has attended 1 exercise session, no change in psychosocial status since orientation/walk test.       Expected Outcomes For [place] to handle his stress in healthy ways.       Interventions Relaxation education;Stress management education;Encouraged to attend Pulmonary Rehabilitation for the exercise       Continue Psychosocial Services  Follow up required by staff               Initial Review   Source of Stress Concerns Chronic Illness;Financial;Unable to perform yard/household activities;Unable to participate in former interests or hobbies              Psychosocial Discharge (Final Psychosocial Re-Evaluation):  Psychosocial Re-Evaluation - 09/18/20 1340      Psychosocial Re-Evaluation   Current issues with Current Stress Concerns;Current Anxiety/Panic    Comments Judas just started pulmonary rehab, has attended 1 exercise session, no change in psychosocial status since orientation/walk test.    Expected Outcomes For Jamarl to handle his stress in healthy ways.    Interventions Relaxation education;Stress management education;Encouraged to attend Pulmonary Rehabilitation for the exercise    Continue Psychosocial Services  Follow up required by staff      Initial Review   Source of Stress Concerns Chronic Illness;Financial;Unable to perform yard/household activities;Unable to participate in former interests or hobbies  Education: Education Goals: Education classes will be provided on a weekly basis, covering required topics. Participant will state understanding/return demonstration of topics presented.  Learning Barriers/Preferences:  Learning Barriers/Preferences - 09/04/20 1103      Learning Barriers/Preferences   Learning Barriers None    Learning Preferences Written Material           Education Topics: Risk Factor  Reduction:  -Group instruction that is supported by a PowerPoint presentation. Instructor discusses the definition of a risk factor, different risk factors for pulmonary disease, and how the heart and lungs work together.     Nutrition for Pulmonary Patient:  -Group instruction provided by PowerPoint slides, verbal discussion, and written materials to support subject matter. The instructor gives an explanation and review of healthy diet recommendations, which includes a discussion on weight management, recommendations for fruit and vegetable consumption, as well as protein, fluid, caffeine, fiber, sodium, sugar, and alcohol. Tips for eating when patients are short of breath are discussed.   Pursed Lip Breathing:  -Group instruction that is supported by demonstration and informational handouts. Instructor discusses the benefits of pursed lip and diaphragmatic breathing and detailed demonstration on how to preform both.     Oxygen Safety:  -Group instruction provided by PowerPoint, verbal discussion, and written material to support subject matter. There is an overview of "What is Oxygen" and "Why do we need it".  Instructor also reviews how to create a safe environment for oxygen use, the importance of using oxygen as prescribed, and the risks of noncompliance. There is a brief discussion on traveling with oxygen and resources the patient may utilize.   Oxygen Equipment:  -Group instruction provided by [facility] Staff utilizing handouts, written materials, and equipment demonstrations.   Signs and Symptoms:  -Group instruction provided by written material and verbal discussion to support subject matter. Warning signs and symptoms of infection, stroke, and heart attack are reviewed and when to call the physician/911 reinforced. Tips for preventing the spread of infection discussed.   Advanced Directives:  -Group instruction provided by verbal instruction and written material to support  subject matter. Instructor reviews Advanced Directive laws and proper instruction for filling out document.   Pulmonary Video:  -Group video education that reviews the importance of medication and oxygen compliance, exercise, good nutrition, pulmonary hygiene, and pursed lip and diaphragmatic breathing for the pulmonary patient.   Exercise for the Pulmonary Patient:  -Group instruction that is supported by a PowerPoint presentation. Instructor discusses benefits of exercise, core components of exercise, frequency, duration, and intensity of an exercise routine, importance of utilizing pulse oximetry during exercise, safety while exercising, and options of places to exercise outside of rehab.     Pulmonary Medications:  -Verbally interactive group education provided by instructor with focus on inhaled medications and proper administration.   Anatomy and Physiology of the Respiratory System and Intimacy:  -Group instruction provided by PowerPoint, verbal discussion, and written material to support subject matter. Instructor reviews respiratory cycle and anatomical components of the respiratory system and their functions. Instructor also reviews differences in obstructive and restrictive respiratory diseases with examples of each. Intimacy, Sex, and Sexuality differences are reviewed with a discussion on how relationships can change when diagnosed with pulmonary disease. Common sexual concerns are reviewed.   MD DAY -A group question and answer session with a medical doctor that allows participants to ask questions that relate to their pulmonary disease state.   OTHER EDUCATION -Group or individual verbal, written, or video instructions that  support the educational goals of the pulmonary rehab program.   Holiday Eating Survival Tips:  -Group instruction provided by PowerPoint slides, verbal discussion, and written materials to support subject matter. The instructor gives patients tips,  tricks, and techniques to help them not only survive but enjoy the holidays despite the onslaught of food that accompanies the holidays.   Knowledge Questionnaire Score:  Knowledge Questionnaire Score - 09/04/20 1103      Knowledge Questionnaire Score   Pre Score 17/18           Core Components/Risk Factors/Patient Goals at Admission:  Personal Goals and Risk Factors at Admission - 09/04/20 1103      Core Components/Risk Factors/Patient Goals on Admission   Improve shortness of breath with ADL's Yes    Intervention Provide education, individualized exercise plan and daily activity instruction to help decrease symptoms of SOB with activities of daily living.    Expected Outcomes Short Term: Improve cardiorespiratory fitness to achieve a reduction of symptoms when performing ADLs;Long Term: Be able to perform more ADLs without symptoms or delay the onset of symptoms    Stress Yes    Intervention Offer individual and/or small group education and counseling on adjustment to heart disease, stress management and health-related lifestyle change. Teach and support self-help strategies.           Core Components/Risk Factors/Patient Goals Review:   Goals and Risk Factor Review    Row Name 09/04/20 1104 09/18/20 1341           Core Components/Risk Factors/Patient Goals Review   Personal Goals Review Develop more efficient breathing techniques such as purse lipped breathing and diaphragmatic breathing and practicing self-pacing with activity.;Increase knowledge of respiratory medications and ability to use respiratory devices properly.;Improve shortness of breath with ADL's;Stress Develop more efficient breathing techniques such as purse lipped breathing and diaphragmatic breathing and practicing self-pacing with activity.;Increase knowledge of respiratory medications and ability to use respiratory devices properly.;Improve shortness of breath with ADL's      Review -- Just started pulmonary  rehab, has attended 1 exercise session, too early to have met program goals.      Expected Outcomes -- See admission goals.             Core Components/Risk Factors/Patient Goals at Discharge (Final Review):   Goals and Risk Factor Review - 09/18/20 1341      Core Components/Risk Factors/Patient Goals Review   Personal Goals Review Develop more efficient breathing techniques such as purse lipped breathing and diaphragmatic breathing and practicing self-pacing with activity.;Increase knowledge of respiratory medications and ability to use respiratory devices properly.;Improve shortness of breath with ADL's    Review Just started pulmonary rehab, has attended 1 exercise session, too early to have met program goals.    Expected Outcomes See admission goals.           ITP Comments:   Comments:

## 2020-09-19 NOTE — Progress Notes (Signed)
Daily Session Note  Patient Details  Name: Dennis Howe [name]: [id number] Date of Birth: 1975/12/30 Referring Provider:   April Manson Pulmonary Rehab Walk Test from 09/04/2020 in [place]  Referring Provider Dr. Vaughan Browner      Encounter Date: 09/19/2020  Check In:  Session Check In - 09/19/20 1419      Check-In   Supervising physician immediately available to respond to emergencies [facility] immediately available    Physician(s) Dr. Tawanna Solo    Location [facility]    Staff Present Rosebud Poles, RN, Isaac Laud, MS, ACSM-CEP, Exercise Physiologist;Hydeia Mcatee Ysidro Evert, RN    Virtual Visit No    Medication changes reported     No    Fall or balance concerns reported    No    Tobacco Cessation No Change    Warm-up and Cool-down Performed on first and last piece of equipment    Resistance Training Performed Yes    VAD Patient? No    PAD/SET Patient? No      Pain Assessment   Currently in Pain? No/denies           Capillary Blood Glucose: No results found for this or any previous visit (from the past 24 hour(s)).    Social History   Tobacco Use  Smoking Status Former Smoker  Smokeless Tobacco Never Used  Tobacco Comment   smoked as a teenager    Goals Met:  Exercise tolerated well No report of cardiac concerns or symptoms Strength training completed today  Goals Unmet:  Not Applicable  Comments: Service time is from 1336 to 48    Dr. Fransico Him is Medical Director for Cardiac Rehab at [facility].

## 2020-09-21 ENCOUNTER — Emergency Department (HOSPITAL_COMMUNITY)
Admission: EM | Admit: 2020-09-21 | Discharge: 2020-09-22 | Disposition: A | Discharge status: Home / Self Care | Payer: 59 | Source: Home / Self Care | Attending: Emergency Medicine | Admitting: Emergency Medicine

## 2020-09-21 ENCOUNTER — Other Ambulatory Visit: Payer: Self-pay

## 2020-09-21 ENCOUNTER — Encounter (HOSPITAL_COMMUNITY): Payer: Self-pay | Admitting: Emergency Medicine

## 2020-09-21 ENCOUNTER — Encounter (HOSPITAL_COMMUNITY)
Admission: RE | Admit: 2020-09-21 | Discharge: 2020-09-21 | Disposition: A | Discharge status: Home / Self Care | Payer: 59 | Source: Ambulatory Visit | Attending: Pulmonary Disease | Admitting: Pulmonary Disease

## 2020-09-21 DIAGNOSIS — R202 Paresthesia of skin: Secondary | ICD-10-CM

## 2020-09-21 DIAGNOSIS — Z8616 Personal history of COVID-19: Secondary | ICD-10-CM | POA: Insufficient documentation

## 2020-09-21 DIAGNOSIS — R739 Hyperglycemia, unspecified: Secondary | ICD-10-CM

## 2020-09-21 DIAGNOSIS — R030 Elevated blood-pressure reading, without diagnosis of hypertension: Secondary | ICD-10-CM

## 2020-09-21 DIAGNOSIS — U099 Post covid-19 condition, unspecified: Secondary | ICD-10-CM

## 2020-09-21 DIAGNOSIS — Z87891 Personal history of nicotine dependence: Secondary | ICD-10-CM | POA: Insufficient documentation

## 2020-09-21 DIAGNOSIS — E876 Hypokalemia: Secondary | ICD-10-CM

## 2020-09-21 LAB — CBC
HCT: 44.7 % (ref 39.0–52.0)
Hemoglobin: 14.7 g/dL (ref 13.0–17.0)
MCH: 28.9 pg (ref 26.0–34.0)
MCHC: 32.9 g/dL (ref 30.0–36.0)
MCV: 87.8 fL (ref 80.0–100.0)
Platelets: 278 10*3/uL (ref 150–400)
RBC: 5.09 MIL/uL (ref 4.22–5.81)
RDW: 12.5 % (ref 11.5–15.5)
WBC: 9.9 10*3/uL (ref 4.0–10.5)
nRBC: 0 % (ref 0.0–0.2)

## 2020-09-21 LAB — BASIC METABOLIC PANEL
Anion gap: 8 (ref 5–15)
BUN: 9 mg/dL (ref 6–20)
CO2: 24 mmol/L (ref 22–32)
Calcium: 9.3 mg/dL (ref 8.9–10.3)
Chloride: 104 mmol/L (ref 98–111)
Creatinine, Ser: 0.83 mg/dL (ref 0.61–1.24)
GFR, Estimated: >60 mL/min (ref >60)
Glucose, Bld: 170 mg/dL — ABNORMAL HIGH (ref 70–99)
Potassium: 3 mmol/L — ABNORMAL LOW (ref 3.5–5.1)
Sodium: 136 mmol/L (ref 135–145)

## 2020-09-21 NOTE — Progress Notes (Signed)
Daily Session Note  Patient Details  Name: Dennis Howe [name]: [id number] Date of Birth: 09/17/1975 Referring Provider:   April Manson Pulmonary Rehab Walk Test from 09/04/2020 in [place]  Referring Provider Dr. Vaughan Browner      Encounter Date: 09/21/2020  Check In:  Session Check In - 09/21/20 1420      Check-In   Supervising physician immediately available to respond to emergencies [facility] immediately available    Physician(s) Dr. Florencia Reasons    [facility]    Staff Present Rosebud Poles, RN, BSN;Hayze Gazda Ysidro Evert, RN;Jessica Hassell Done, MS, ACSM-CEP, Exercise Physiologist    Virtual Visit No    Medication changes reported     No    Fall or balance concerns reported    No    Tobacco Cessation No Change    Warm-up and Cool-down Performed on first and last piece of equipment    Resistance Training Performed Yes    VAD Patient? No    PAD/SET Patient? No      Pain Assessment   Currently in Pain? No/denies    Multiple Pain Sites No           Capillary Blood Glucose: No results found for this or any previous visit (from the past 24 hour(s)).    Social History   Tobacco Use  Smoking Status Former Smoker  Smokeless Tobacco Never Used  Tobacco Comment   smoked as a teenager    Goals Met:  Exercise tolerated well No report of cardiac concerns or symptoms Strength training completed today  Goals Unmet:  Not Applicable  Comments: Service time is from 1340 to 1438    Dr. Fransico Him is Medical Director for Cardiac Rehab at [facility].

## 2020-09-21 NOTE — ED Triage Notes (Signed)
Pt states he struggles with anxiety after having covid and complications related to covid. States his blood pressure has been elevated and has been feeling unwell. Denies chest pain/shortness of breath, reports he had a headache that has subsided.

## 2020-09-22 ENCOUNTER — Telehealth: Payer: Self-pay | Admitting: Internal Medicine

## 2020-09-22 DIAGNOSIS — U099 Post covid-19 condition, unspecified: Secondary | ICD-10-CM | POA: Diagnosis present

## 2020-09-22 LAB — TROPONIN I (HIGH SENSITIVITY): Troponin I (High Sensitivity): 6 ng/L (ref <18)

## 2020-09-22 MED ORDER — POTASSIUM CHLORIDE CRYS ER 20 MEQ PO TBCR
40.0000 meq | EXTENDED_RELEASE_TABLET | Freq: Once | ORAL | Status: AC
  Administered 2020-09-22: 40 meq via ORAL
  Filled 2020-09-22: qty 2

## 2020-09-22 MED ORDER — POTASSIUM CHLORIDE CRYS ER 20 MEQ PO TBCR: 20.0000 meq | EXTENDED_RELEASE_TABLET | Freq: Every day | ORAL | 0 refills | Status: DC

## 2020-09-22 NOTE — ED Notes (Signed)
ED Provider at bedside. 

## 2020-09-22 NOTE — Discharge Instructions (Addendum)
It was our pleasure to provide your ER care today - we hope that you feel better.  From today's labs, your potassium level is low (3) - eat plenty of fruits and vegetables, take potassium supplement as prescribed, and follow up with primary care doctor in one week. Also have your blood pressure and blood sugar (glucose 170) rechecked then as they were high today.   Return to ER if worse, new symptoms, chest pain, trouble breathing, fainting, or other concern.

## 2020-09-22 NOTE — ED Notes (Signed)
Patient not in assigned room at this time.

## 2020-09-22 NOTE — ED Notes (Signed)
Medications follow up appts reviewed w/ pt. Denies questions or concerns @ this time. Education on s/s of worsening and when to return. Found to be hypokalemic tx for such. Otherwise WNL workup BP stable since room. NAD noted. VSS. Left w/. wife even and steady gait.

## 2020-09-22 NOTE — Telephone Encounter (Signed)
[facility] Report/Call: ---Caller states that her husband has been having blood pressure issues BP 157/103 taken 30 minutes ago - he was feeling dizzy and lightheaded - head ache  Advised to go to ED now. Patient did go to ED as advised.

## 2020-09-22 NOTE — ED Provider Notes (Signed)
[facility] EMERGENCY DEPARTMENT Provider Note   CSN: [id number] Arrival date & time: 09/21/20  2210     History Chief Complaint  Patient presents with  . Hypertension    Dennis Howe is a 45 y.o. male.  Patient indicates concerned that his bp was high today. State earlier was also having anxiety, notes hx same, and states he felt numbness/tingling sensation to bilateral hands/feet. Currently no numbness or tingling. No weakness. No loss of normal functional ability. No unilateral numbness or tingling. Denies chest pain or discomfort. No sob. No palpitations. No abd pain or nvd. No fever or chills. No syncope. Denies hx htn, or taking meds for same.   The history is provided by the patient and a relative.  Hypertension Pertinent negatives include no chest pain, no abdominal pain, no headaches and no shortness of breath.       Past Medical History:  Diagnosis Date  . Acute on chronic respiratory failure with hypoxia (HCC)   . Bilateral pneumothoraces   . BMI 30.0-30.9,adult   . COVID-19 virus infection   . GERD (gastroesophageal reflux disease)   . Pneumonia due to COVID-19 virus     Patient Active Problem List   Diagnosis Date Noted  . Pre-diabetes 08/08/2020  . Acute on chronic respiratory failure with hypoxia (HCC)   . BMI 30.0-30.9,adult     Past Surgical History:  Procedure Laterality Date  . APPENDECTOMY    . IR GASTROSTOMY TUBE MOD SED  05/03/2020  . IR GASTROSTOMY TUBE REMOVAL  07/04/2020       Family History  Problem Relation Age of Onset  . Diabetes Mother   . Hypertension Mother   . Cancer Father        lung  . Asthma Daughter     Social History   Tobacco Use  . Smoking status: Former Games developer  . Smokeless tobacco: Never Used  . Tobacco comment: smoked as a teenager  Substance Use Topics  . Alcohol use: Never  . Drug use: Never    Home Medications Prior to Admission medications   Not on File    Allergies     Patient has no known allergies.  Review of Systems   Review of Systems  Constitutional: Negative for chills and fever.  HENT: Negative for sore throat.   Eyes: Negative for visual disturbance.  Respiratory: Negative for cough and shortness of breath.   Cardiovascular: Negative for chest pain, palpitations and leg swelling.  Gastrointestinal: Negative for abdominal pain, nausea and vomiting.  Genitourinary: Negative for flank pain.  Musculoskeletal: Negative for back pain and neck pain.  Skin: Negative for rash.  Neurological: Positive for numbness. Negative for syncope, speech difficulty, weakness and headaches.  Hematological: Does not bruise/bleed easily.  Psychiatric/Behavioral: Negative for confusion.    Physical Exam Updated Vital Signs BP (!) 145/90   Pulse (!) 110   Temp 98.3 F (36.8 C) (Oral)   Resp 16   SpO2 99%   Physical Exam Vitals and nursing note reviewed.  Constitutional:      Appearance: Normal appearance. He is well-developed.  HENT:     Head: Atraumatic.     Nose: Nose normal.     Mouth/Throat:     Mouth: Mucous membranes are moist.     Pharynx: Oropharynx is clear.  Eyes:     General: No scleral icterus.    Conjunctiva/sclera: Conjunctivae normal.     Pupils: Pupils are equal, round, and reactive to light.  Neck:     Vascular: No carotid bruit.     Trachea: No tracheal deviation.     Comments: Thyroid not grossly enlarged or tender.  Cardiovascular:     Rate and Rhythm: Normal rate and regular rhythm.     Pulses: Normal pulses.     Heart sounds: Normal heart sounds. No murmur heard. No friction rub. No gallop.   Pulmonary:     Effort: Pulmonary effort is normal. No accessory muscle usage or respiratory distress.     Breath sounds: Normal breath sounds.  Abdominal:     General: Bowel sounds are normal. There is no distension.     Palpations: Abdomen is soft.     Tenderness: There is no abdominal tenderness.  Genitourinary:    Comments: No  cva tenderness. Musculoskeletal:        General: No swelling or tenderness.     Cervical back: Normal range of motion and neck supple. No rigidity or tenderness.     Right lower leg: No edema.     Left lower leg: No edema.  Skin:    General: Skin is warm and dry.     Findings: No rash.  Neurological:     Mental Status: He is alert.     Comments: Alert, speech clear. Steady gait.   Psychiatric:        Mood and Affect: Mood normal.     ED Results / Procedures / Treatments   Labs (all labs ordered are listed, but only abnormal results are displayed) Results for orders placed or performed during the hospital encounter of 09/21/20  Basic metabolic panel  Result Value Ref Range   Sodium 136 135 - 145 mmol/L   Potassium 3.0 (L) 3.5 - 5.1 mmol/L   Chloride 104 98 - 111 mmol/L   CO2 24 22 - 32 mmol/L   Glucose, Bld 170 (H) 70 - 99 mg/dL   BUN 9 6 - 20 mg/dL   Creatinine, Ser 2.69 0.61 - 1.24 mg/dL   Calcium 9.3 8.9 - 48.5 mg/dL   GFR, Estimated >46 >27 mL/min   Anion gap 8 5 - 15  CBC  Result Value Ref Range   WBC 9.9 4.0 - 10.5 K/uL   RBC 5.09 4.22 - 5.81 MIL/uL   Hemoglobin 14.7 13.0 - 17.0 g/dL   HCT 03.5 00.9 - 38.1 %   MCV 87.8 80.0 - 100.0 fL   MCH 28.9 26.0 - 34.0 pg   MCHC 32.9 30.0 - 36.0 g/dL   RDW 82.9 93.7 - 16.9 %   Platelets 278 150 - 400 K/uL   nRBC 0.0 0.0 - 0.2 %  Troponin I (High Sensitivity)  Result Value Ref Range   Troponin I (High Sensitivity) 6 <18 ng/L   EKG None  Radiology No results found.  Procedures Procedures   Medications Ordered in ED Medications  potassium chloride SA (KLOR-CON) CR tablet 40 mEq (has no administration in time range)    ED Course  I have reviewed the triage vital signs and the nursing notes.  Pertinent labs & imaging results that were available during my care of the patient were reviewed by me and considered in my medical decision making (see chart for details).    MDM Rules/Calculators/A&P                           Labs sent.   Continuous pulse ox and cardiac monitoring.   Reviewed nursing notes  and prior charts for additional history.   Labs reviewed/interpreted by me - k mildly low. kcl po.   Earlier symptoms resolved. BP is elevated but does not require emergent lowering. Will have f/u pcp re bp, mildly low k, and elev glucose.   Return precautions provided.    Final Clinical Impression(s) / ED Diagnoses Final diagnoses:  None    Rx / DC Orders ED Discharge Orders    None       Cathren Laine, MD 09/22/20 [phone]

## 2020-09-26 ENCOUNTER — Encounter (HOSPITAL_COMMUNITY)
Admission: RE | Admit: 2020-09-26 | Discharge: 2020-09-26 | Disposition: A | Discharge status: Home / Self Care | Payer: 59 | Source: Ambulatory Visit | Attending: Pulmonary Disease | Admitting: Pulmonary Disease

## 2020-09-26 ENCOUNTER — Other Ambulatory Visit: Payer: Self-pay

## 2020-09-26 VITALS — Wt 193.3 lb

## 2020-09-26 DIAGNOSIS — U099 Post covid-19 condition, unspecified: Secondary | ICD-10-CM | POA: Diagnosis not present

## 2020-09-26 NOTE — Progress Notes (Signed)
Daily Session Note  Patient Details  Name: MARCO RAPER [name]: [id number] Date of Birth: 1975/10/24 Referring Provider:   April Manson Pulmonary Rehab Walk Test from 09/04/2020 in [place]  Referring Provider Dr. Vaughan Browner      Encounter Date: 09/26/2020  Check In:  Session Check In - 09/26/20 1433      Check-In   Supervising physician immediately available to respond to emergencies [facility] immediately available    Physician(s) Dr. Tawanna Solo    [facility]    Staff Present Maurice Small, RN, BSN;Rozlynn Lippold Ysidro Evert, RN;Jessica Hassell Done, MS, ACSM-CEP, Exercise Physiologist    Virtual Visit No    Medication changes reported     No    Fall or balance concerns reported    No    Tobacco Cessation No Change    Warm-up and Cool-down Performed on first and last piece of equipment    Resistance Training Performed Yes    VAD Patient? No    PAD/SET Patient? No      Pain Assessment   Currently in Pain? No/denies    Pain Score 0-No pain    Multiple Pain Sites No           Capillary Blood Glucose: No results found for this or any previous visit (from the past 24 hour(s)).   Exercise Prescription Changes - 09/26/20 1500      Response to Exercise   Blood Pressure (Admit) 126/80    Blood Pressure (Exercise) 140/90    Blood Pressure (Exit) 114/80    Heart Rate (Admit) 83 bpm    Heart Rate (Exercise) 108 bpm    Heart Rate (Exit) 92 bpm    Oxygen Saturation (Admit) 97 %    Oxygen Saturation (Exercise) 94 %    Oxygen Saturation (Exit) 97 %    Rating of Perceived Exertion (Exercise) 11    Perceived Dyspnea (Exercise) 0    Duration Continue with 30 min of aerobic exercise without signs/symptoms of physical distress.    Intensity Other (comment)   40-80% of HRR     Progression   Progression Continue to progress workloads to maintain intensity without signs/symptoms of physical distress.      Resistance Training    Training Prescription Yes    Weight blue bands    Reps 10-15    Time 10 Minutes      Treadmill   MPH 2.5    Grade 0    Minutes 15      Bike   Level 3    Minutes 15    METs 5           Social History   Tobacco Use  Smoking Status Former Smoker  Smokeless Tobacco Never Used  Tobacco Comment   smoked as a teenager    Goals Met:  Exercise tolerated well No report of cardiac concerns or symptoms Strength training completed today  Goals Unmet:  Not Applicable  Comments: Service time is from 1320 to 1425    Dr. Fransico Him is Medical Director for Cardiac Rehab at [facility].

## 2020-09-28 ENCOUNTER — Encounter (HOSPITAL_COMMUNITY)
Admission: RE | Admit: 2020-09-28 | Discharge: 2020-09-28 | Disposition: A | Discharge status: Home / Self Care | Payer: 59 | Source: Ambulatory Visit | Attending: Pulmonary Disease | Admitting: Pulmonary Disease

## 2020-09-28 ENCOUNTER — Other Ambulatory Visit: Payer: Self-pay

## 2020-09-28 DIAGNOSIS — U099 Post covid-19 condition, unspecified: Secondary | ICD-10-CM

## 2020-09-28 NOTE — Progress Notes (Signed)
Daily Session Note  Patient Details  Name: Dennis Howe [name]: [id number] Date of Birth: 12/06/1975 Referring Provider:   April Manson Pulmonary Rehab Walk Test from 09/04/2020 in [place]  Referring Provider Dr. Vaughan Browner      Encounter Date: 09/28/2020  Check In:  Session Check In - 09/28/20 1433      Check-In   Supervising physician immediately available to respond to emergencies [facility] immediately available    Physician(s) Dr. Pricilla Loveless    [facility]    Staff Present Rosebud Poles, RN, BSN;Zeppelin Commisso Ysidro Evert, RN;Jessica Hassell Done, MS, ACSM-CEP, Exercise Physiologist    Virtual Visit No    Medication changes reported     No    Fall or balance concerns reported    No    Tobacco Cessation No Change    Warm-up and Cool-down Performed on first and last piece of equipment    Resistance Training Performed Yes    VAD Patient? No    PAD/SET Patient? No      Pain Assessment   Currently in Pain? No/denies           Capillary Blood Glucose: No results found for this or any previous visit (from the past 24 hour(s)).    Social History   Tobacco Use  Smoking Status Former Smoker  Smokeless Tobacco Never Used  Tobacco Comment   smoked as a teenager    Goals Met:  Exercise tolerated well No report of cardiac concerns or symptoms Strength training completed today  Goals Unmet:  Not Applicable  Comments: Service time is from 1325 to 1420    Dr. Fransico Him is Medical Director for Cardiac Rehab at [facility].

## 2020-09-29 ENCOUNTER — Encounter: Payer: Self-pay | Admitting: Family

## 2020-09-29 ENCOUNTER — Ambulatory Visit (INDEPENDENT_AMBULATORY_CARE_PROVIDER_SITE_OTHER): Payer: 59 | Admitting: Family

## 2020-09-29 VITALS — BP 116/78 | HR 78 | Temp 98.2°F | Ht 71.0 in | Wt 191.4 lb

## 2020-09-29 DIAGNOSIS — J019 Acute sinusitis, unspecified: Secondary | ICD-10-CM | POA: Diagnosis not present

## 2020-09-29 DIAGNOSIS — R030 Elevated blood-pressure reading, without diagnosis of hypertension: Secondary | ICD-10-CM | POA: Diagnosis not present

## 2020-09-29 DIAGNOSIS — E876 Hypokalemia: Secondary | ICD-10-CM | POA: Diagnosis not present

## 2020-09-29 LAB — BASIC METABOLIC PANEL
BUN: 13 mg/dL (ref 6–23)
CO2: 26 mEq/L (ref 19–32)
Calcium: 10 mg/dL (ref 8.4–10.5)
Chloride: 102 mEq/L (ref 96–112)
Creatinine, Ser: 0.83 mg/dL (ref 0.40–1.50)
GFR: 107.71 mL/min (ref >60.00)
Glucose, Bld: 90 mg/dL (ref 70–99)
Potassium: 3.8 mEq/L (ref 3.5–5.1)
Sodium: 137 mEq/L (ref 135–145)

## 2020-09-29 MED ORDER — DOXYCYCLINE HYCLATE 100 MG PO TABS: 100.0000 mg | ORAL_TABLET | Freq: Two times a day (BID) | ORAL | 0 refills | Status: DC

## 2020-09-29 NOTE — Progress Notes (Signed)
Dennis Howe 45 y.o. male Nutrition Note  Diagnosis: Post Covid condition   Past Medical History:  Diagnosis Date  . Acute on chronic respiratory failure with hypoxia (HCC)   . Bilateral pneumothoraces   . BMI 30.0-30.9,adult   . COVID-19 virus infection   . GERD (gastroesophageal reflux disease)   . Pneumonia due to COVID-19 virus      Medications reviewed.   Current Outpatient Medications:  .  amoxicillin-clavulanate (AUGMENTIN) 875-125 MG tablet, Take 1 tablet by mouth See admin instructions. Bid x 10 days, Disp: , Rfl:  .  ASHWAGANDHA PO, Take 2 tablets by mouth in the morning and at bedtime., Disp: , Rfl:  .  cholecalciferol (VITAMIN D3) 25 MCG (1000 UNIT) tablet, Take 1,000 Units by mouth daily., Disp: , Rfl:  .  FERROUS SULFATE PO, Take 1 tablet by mouth every other day., Disp: , Rfl:  .  Omega-3 Fatty Acids (FISH OIL) 1200 MG CAPS, Take 4,200 mg by mouth daily., Disp: , Rfl:  .  potassium chloride SA (KLOR-CON) 20 MEQ tablet, Take 1 tablet (20 mEq total) by mouth daily., Disp: 10 tablet, Rfl: 0 .  vitamin C (ASCORBIC ACID) 500 MG tablet, Take 500 mg by mouth daily., Disp: , Rfl:  .  Zinc 50 MG TABS, Take 50 mg by mouth daily., Disp: , Rfl:    Ht Readings from Last 1 Encounters:  09/04/20 5\' 11"  (1.803 m)     Wt Readings from Last 3 Encounters:  09/26/20 193 lb 5.5 oz (87.7 kg)  09/04/20 188 lb 7.9 oz (85.5 kg)  08/24/20 187 lb 3.2 oz (84.9 kg)     There is no height or weight on file to calculate BMI.   Social History   Tobacco Use  Smoking Status Former Smoker  Smokeless Tobacco Never Used  Tobacco Comment   smoked as a teenager      Nutrition Note  Spoke with pt. Nutrition Plan and Nutrition Survey goals reviewed with pt.   Pt last A1C 6.2 while in the hospital for COVID19. Follow up CBGs 120 mg/dl, 93 mg/dl, 10/24/20 mg/dl, 350 mg/dl.  Low vitamin D (20 ng/ml). Taking Vitamin D supplement close to dinner.  He was still taking zinc, vitamin c,  and kcl. He states MD had discontinued these supplements after labs WNL.  He lost 55 lbs during hospitalization. His appetite is back to normal. He has gained 25 lbs and wants to maintain weight.   He has reduced sugar intake, no sugary beverages anymore. He increased fruit and vegetable intake. He drinks a protein shake for breakfast and eats lunch and dinner. He eats out 1 time per week.  He only drinks water.  No alcohol or tobacco.   Pt expressed understanding of the information reviewed.    Nutrition Diagnosis ? Food-and nutrition-related knowledge deficit related to lack of exposure to information as related to elevated glucose values, low vitamin d  Nutrition Intervention ? Pt's individual nutrition plan reviewed with pt. ? Benefits of adopting healthy diet reviewed with Rate My Plate survey   ? Continue client-centered nutrition education by RD, as part of interdisciplinary care.  Goal(s) ? Pt to take vitamin D with a balanced meal  ? Pt to continue to limit sugary beverages ? Pt to build a healthy plate including vegetables, fruits, whole grains, and low-fat dairy products in a heart healthy meal plan. Plan:   Will provide client-centered nutrition education as part of interdisciplinary care  Monitor and evaluate  progress toward nutrition goal with team.   Andrey Campanile, MS, RDN, LDN

## 2020-09-29 NOTE — Progress Notes (Signed)
Dennis Howe is a 45 y.o. male with the following history as recorded in Epic[facility]:  Patient Active Problem List   Diagnosis Date Noted  . Pre-diabetes 08/08/2020  . Acute on chronic respiratory failure with hypoxia (HCC)   . BMI 30.0-30.9,adult     Current Outpatient Medications  Medication Sig Dispense Refill  . cholecalciferol (VITAMIN D3) 25 MCG (1000 UNIT) tablet Take 1,000 Units by mouth daily.    Marland Kitchen doxycycline (VIBRA-TABS) 100 MG tablet Take 1 tablet (100 mg total) by mouth 2 (two) times daily. 20 tablet 0  . Omega-3 Fatty Acids (FISH OIL) 1200 MG CAPS Take 4,200 mg by mouth daily.    . potassium chloride SA (KLOR-CON) 20 MEQ tablet Take 1 tablet (20 mEq total) by mouth daily. 10 tablet 0  . vitamin C (ASCORBIC ACID) 500 MG tablet Take 500 mg by mouth daily.     No current facility-administered medications for this visit.    Allergies: Patient has no known allergies.  Past Medical History:  Diagnosis Date  . Acute on chronic respiratory failure with hypoxia (HCC)   . Bilateral pneumothoraces   . BMI 30.0-30.9,adult   . COVID-19 virus infection   . GERD (gastroesophageal reflux disease)   . Pneumonia due to COVID-19 virus     Past Surgical History:  Procedure Laterality Date  . APPENDECTOMY    . IR GASTROSTOMY TUBE MOD SED  05/03/2020  . IR GASTROSTOMY TUBE REMOVAL  07/04/2020    Family History  Problem Relation Age of Onset  . Diabetes Mother   . Hypertension Mother   . Cancer Father        lung  . Asthma Daughter     Social History   Tobacco Use  . Smoking status: Former Games developer  . Smokeless tobacco: Never Used  . Tobacco comment: smoked as a teenager  Substance Use Topics  . Alcohol use: Never    Subjective:   Seen at ER on 3/31 with elevated blood pressure and hypokalemia; told to follow-up to re-check potassium within 10 days; still has 2 days of potassium left to take;  Concerned that has lingering sinus infection; treated with 10 days of  Augmentin at end of March; still complaining of swollen glands, facial pressure   Objective:  Vitals:   09/29/20 1322  BP: 116/78  Pulse: 78  Temp: 98.2 F (36.8 C)  TempSrc: Oral  SpO2: 97%  Weight: 191 lb 6.4 oz (86.8 kg)  Height: 5\' 11"  (1.803 m)    General: Well developed, well nourished, in no acute distress  Skin : Warm and dry.  Head: Normocephalic and atraumatic  Eyes: Sclera and conjunctiva clear; pupils round and reactive to light; extraocular movements intact  Ears: External normal; canals clear; tympanic membranes normal  Oropharynx: Pink, supple. No suspicious lesions  Neck: Supple without thyromegaly, adenopathy  Lungs: Respirations unlabored; clear to auscultation bilaterally without wheeze, rales, rhonchi  CVS exam: normal rate and regular rhythm.  Neurologic: Alert and oriented; speech intact; face symmetrical; moves all extremities well; CNII-XII intact without focal deficit   Assessment:  1. Hypokalemia   2. Acute sinusitis, recurrence not specified, unspecified location   3. Elevated blood pressure reading     Plan:  1. Check BMP today; to consider re-checking in about 2 weeks after he has completed treatment; 2. Rx for Doxycycline 100 mg bid x 10 days; 3. Normal today- continue to monitor;  Plan to see his PCP with continued questions or concerns;  This visit occurred during the SARS-CoV-2 public health emergency.  Safety protocols were in place, including screening questions prior to the visit, additional usage of staff PPE, and extensive cleaning of exam room while observing appropriate contact time as indicated for disinfecting solutions.     No follow-ups on file.  Orders Placed This Encounter  Procedures  . Basic Metabolic Panel (BMET)    Requested Prescriptions   Signed Prescriptions Disp Refills  . doxycycline (VIBRA-TABS) 100 MG tablet 20 tablet 0    Sig: Take 1 tablet (100 mg total) by mouth 2 (two) times daily.

## 2020-10-02 ENCOUNTER — Ambulatory Visit: Payer: 59 | Admitting: Internal Medicine

## 2020-10-03 ENCOUNTER — Encounter (HOSPITAL_COMMUNITY)
Admission: RE | Admit: 2020-10-03 | Discharge: 2020-10-03 | Disposition: A | Discharge status: Home / Self Care | Payer: 59 | Source: Ambulatory Visit | Attending: Pulmonary Disease | Admitting: Pulmonary Disease

## 2020-10-03 ENCOUNTER — Other Ambulatory Visit: Payer: Self-pay

## 2020-10-03 DIAGNOSIS — U099 Post covid-19 condition, unspecified: Secondary | ICD-10-CM | POA: Diagnosis not present

## 2020-10-03 NOTE — Progress Notes (Signed)
Daily Session Note  Patient Details  Name: Dennis Howe [name]: [id number] Date of Birth: January 05, 1976 Referring Provider:   April Manson Pulmonary Rehab Walk Test from 09/04/2020 in [place]  Referring Provider Dr. Vaughan Browner      Encounter Date: 10/03/2020  Check In:  Session Check In - 10/03/20 1431      Check-In   Supervising physician immediately available to respond to emergencies [facility] immediately available    Physician(s) Dr. Tawanna Solo    Location [facility]    Staff Present Rosebud Poles, RN, Milus Glazier, MS, EP-C, CCRP;Blayton Huttner Hassell Done, MS, ACSM-CEP, Exercise Physiologist    Virtual Visit No    Medication changes reported     No    Fall or balance concerns reported    No    Tobacco Cessation No Change    Warm-up and Cool-down Performed on first and last piece of equipment    Resistance Training Performed Yes    VAD Patient? No    PAD/SET Patient? No      Pain Assessment   Currently in Pain? No/denies    Multiple Pain Sites No           Capillary Blood Glucose: No results found for this or any previous visit (from the past 24 hour(s)).    Social History   Tobacco Use  Smoking Status Former Smoker  Smokeless Tobacco Never Used  Tobacco Comment   smoked as a teenager    Goals Met:  Proper associated with RPD/PD & O2 Sat Independence with exercise equipment Exercise tolerated well No report of cardiac concerns or symptoms Strength training completed today  Goals Unmet:  Not Applicable  Comments: Service time is from 1330 to 1430    Dr. Fransico Him is Medical Director for Cardiac Rehab at [facility].

## 2020-10-05 ENCOUNTER — Other Ambulatory Visit: Payer: Self-pay

## 2020-10-05 ENCOUNTER — Encounter (HOSPITAL_COMMUNITY)
Admission: RE | Admit: 2020-10-05 | Discharge: 2020-10-05 | Disposition: A | Discharge status: Home / Self Care | Payer: 59 | Source: Ambulatory Visit | Attending: Pulmonary Disease | Admitting: Pulmonary Disease

## 2020-10-05 DIAGNOSIS — U099 Post covid-19 condition, unspecified: Secondary | ICD-10-CM

## 2020-10-05 NOTE — Progress Notes (Signed)
Daily Session Note  Patient Details  Name: Dennis Howe [name]: [id number] Date of Birth: 07-17-1975 Referring Provider:   April Manson Pulmonary Rehab Walk Test from 09/04/2020 in [place]  Referring Provider Dr. Vaughan Browner      Encounter Date: 10/05/2020  Check In:  Session Check In - 10/05/20 1428      Check-In   Supervising physician immediately available to respond to emergencies [facility] immediately available    Physician(s) Dr. Florencia Reasons    Location [facility]    Staff Present Rosebud Poles, RN, BSN;Lisa Ysidro Evert, RN;Janell Keeling Hassell Done, MS, ACSM-CEP, Exercise Physiologist    Virtual Visit No    Medication changes reported     No    Fall or balance concerns reported    No    Tobacco Cessation No Change    Warm-up and Cool-down Performed on first and last piece of equipment    Resistance Training Performed Yes    VAD Patient? No    PAD/SET Patient? No      Pain Assessment   Currently in Pain? No/denies    Multiple Pain Sites No           Capillary Blood Glucose: No results found for this or any previous visit (from the past 24 hour(s)).    Social History   Tobacco Use  Smoking Status Former Smoker  Smokeless Tobacco Never Used  Tobacco Comment   smoked as a teenager    Goals Met:  Proper associated with RPD/PD & O2 Sat Independence with exercise equipment Exercise tolerated well No report of cardiac concerns or symptoms Strength training completed today  Goals Unmet:  Not Applicable  Comments: Service time is from 1325 to 1425    Dr. Fransico Him is Medical Director for Cardiac Rehab at [facility].

## 2020-10-10 ENCOUNTER — Encounter (HOSPITAL_COMMUNITY)
Admission: RE | Admit: 2020-10-10 | Discharge: 2020-10-10 | Disposition: A | Discharge status: Home / Self Care | Payer: 59 | Source: Ambulatory Visit | Attending: Pulmonary Disease | Admitting: Pulmonary Disease

## 2020-10-10 ENCOUNTER — Other Ambulatory Visit: Payer: Self-pay

## 2020-10-10 VITALS — Wt 196.0 lb

## 2020-10-10 DIAGNOSIS — U099 Post covid-19 condition, unspecified: Secondary | ICD-10-CM | POA: Diagnosis not present

## 2020-10-10 NOTE — Progress Notes (Signed)
Daily Session Note  Patient Details  Name: Dennis Howe [name]: [id number] Date of Birth: June 17, 1976 Referring Provider:   April Manson Pulmonary Rehab Walk Test from 09/04/2020 in [place]  Referring Provider Dr. Vaughan Browner      Encounter Date: 10/10/2020  Check In:  Session Check In - 10/10/20 1446      Check-In   Supervising physician immediately available to respond to emergencies [facility] immediately available    Physician(s) Dr. Tawanna Solo    [facility]    Staff Present Rosebud Poles, RN, BSN;Shahram Alexopoulos Ysidro Evert, RN;Jessica Hassell Done, MS, ACSM-CEP, Exercise Physiologist    Virtual Visit No    Medication changes reported     No    Fall or balance concerns reported    No    Tobacco Cessation No Change    Warm-up and Cool-down Performed on first and last piece of equipment    Resistance Training Performed Yes    VAD Patient? No    PAD/SET Patient? No      Pain Assessment   Currently in Pain? No/denies    Multiple Pain Sites No           Capillary Blood Glucose: No results found for this or any previous visit (from the past 24 hour(s)).   Exercise Prescription Changes - 10/10/20 1500      Response to Exercise   Blood Pressure (Admit) 114/74    Blood Pressure (Exercise) 144/80    Blood Pressure (Exit) 112/70    Heart Rate (Admit) 76 bpm    Heart Rate (Exercise) 109 bpm    Heart Rate (Exit) 83 bpm    Oxygen Saturation (Admit) 97 %    Oxygen Saturation (Exercise) 93 %    Oxygen Saturation (Exit) 97 %    Rating of Perceived Exertion (Exercise) 9    Perceived Dyspnea (Exercise) 0    Duration Continue with 30 min of aerobic exercise without signs/symptoms of physical distress.    Intensity THRR unchanged      Progression   Progression Continue to progress workloads to maintain intensity without signs/symptoms of physical distress.      Resistance Training   Training Prescription Yes    Weight blue bands     Reps 10-15    Time 10 Minutes      Treadmill   MPH 3    Minutes 15      Bike   Level 3.5    Minutes 15    METs 5.3           Social History   Tobacco Use  Smoking Status Former Smoker  Smokeless Tobacco Never Used  Tobacco Comment   smoked as a teenager    Goals Met:  Exercise tolerated well No report of cardiac concerns or symptoms Strength training completed today  Goals Unmet:  Not Applicable  Comments: Service time is from 1325 to 43    Dr. Fransico Him is Medical Director for Cardiac Rehab at [facility].

## 2020-10-10 NOTE — Progress Notes (Signed)
I have reviewed a Home Exercise Prescription with Dennis Howe . Dennis Howe is currently exercising at home. He walks several miles nearly every day and also does strength training at home. The patient was advised to continue walking and strength training most days of the week for 45-60 minutes.  [name] and I discussed how to progress their exercise prescription. The patient stated that their goals were to get back to normal (pre COVID-19) life and to feel healthy again.  The patient stated that they understand the exercise prescription.  We reviewed exercise guidelines, target heart rate during exercise, RPE Scale, weather conditions, use of rescue inhaler, endpoints for exercise, warmup and cool down.  Patient is encouraged to come to me with any questions. I will continue to follow up with the patient to assist them with progression and safety.    Norris Cross MS, ACSM CEP 4:27 PM 10/10/2020

## 2020-10-12 ENCOUNTER — Encounter (HOSPITAL_COMMUNITY)
Admission: RE | Admit: 2020-10-12 | Discharge: 2020-10-12 | Disposition: A | Discharge status: Home / Self Care | Payer: 59 | Source: Ambulatory Visit | Attending: Pulmonary Disease | Admitting: Pulmonary Disease

## 2020-10-12 ENCOUNTER — Other Ambulatory Visit (HOSPITAL_COMMUNITY)
Admission: RE | Admit: 2020-10-12 | Discharge: 2020-10-12 | Disposition: A | Discharge status: Home / Self Care | Payer: 59 | Source: Ambulatory Visit | Attending: Pulmonary Disease | Admitting: Pulmonary Disease

## 2020-10-12 ENCOUNTER — Other Ambulatory Visit: Payer: Self-pay

## 2020-10-12 DIAGNOSIS — U099 Post covid-19 condition, unspecified: Secondary | ICD-10-CM

## 2020-10-12 DIAGNOSIS — Z01812 Encounter for preprocedural laboratory examination: Secondary | ICD-10-CM | POA: Insufficient documentation

## 2020-10-12 DIAGNOSIS — Z20822 Contact with and (suspected) exposure to covid-19: Secondary | ICD-10-CM | POA: Insufficient documentation

## 2020-10-12 LAB — SARS CORONAVIRUS 2 (TAT 6-24 HRS): SARS Coronavirus 2: NEGATIVE

## 2020-10-12 NOTE — Progress Notes (Signed)
Daily Session Note  Patient Details  Name: Dennis Howe [name]: [id number] Date of Birth: 02/11/1976 Referring Provider:   Flowsheet Row Pulmonary Rehab Walk Test from 09/04/2020 in [facility]  Referring Provider Dr. Mannam      Encounter Date: 10/12/2020  Check In:  Session Check In - 10/12/20 1440      Check-In   Supervising physician immediately available to respond to emergencies [facility] immediately available    Physician(s) Dr. Adhikari    Location [facility]    Staff Present Joan Behrens, RN, BSN; , RN;Jessica Martin, MS, ACSM-CEP, Exercise Physiologist    Virtual Visit No    Medication changes reported     No    Fall or balance concerns reported    No    Tobacco Cessation No Change    Warm-up and Cool-down Performed on first and last piece of equipment    Resistance Training Performed Yes    VAD Patient? No    PAD/SET Patient? No      Pain Assessment   Currently in Pain? No/denies    Multiple Pain Sites No           Capillary Blood Glucose: No results found for this or any previous visit (from the past 24 hour(s)).    Social History   Tobacco Use  Smoking Status Former Smoker  Smokeless Tobacco Never Used  Tobacco Comment   smoked as a teenager    Goals Met:  Exercise tolerated well No report of cardiac concerns or symptoms Strength training completed today  Goals Unmet:  Not Applicable  Comments: Service time is from 1335 to 1428    Dr. Traci Turner is Medical Director for Cardiac Rehab at [facility]. 

## 2020-10-16 ENCOUNTER — Ambulatory Visit (INDEPENDENT_AMBULATORY_CARE_PROVIDER_SITE_OTHER): Payer: 59 | Admitting: Pulmonary Disease

## 2020-10-16 ENCOUNTER — Other Ambulatory Visit: Payer: Self-pay

## 2020-10-16 DIAGNOSIS — U071 COVID-19: Secondary | ICD-10-CM

## 2020-10-16 NOTE — Progress Notes (Signed)
Full PFT performed today. °

## 2020-10-16 NOTE — Patient Instructions (Signed)
Full PFT performed today. °

## 2020-10-17 ENCOUNTER — Encounter (HOSPITAL_COMMUNITY)
Admission: RE | Admit: 2020-10-17 | Discharge: 2020-10-17 | Disposition: A | Discharge status: Home / Self Care | Payer: 59 | Source: Ambulatory Visit | Attending: Pulmonary Disease | Admitting: Pulmonary Disease

## 2020-10-17 DIAGNOSIS — U099 Post covid-19 condition, unspecified: Secondary | ICD-10-CM | POA: Diagnosis not present

## 2020-10-17 LAB — PULMONARY FUNCTION TEST
DL/VA % pred: 162 %
DL/VA: 7.47 ml/min/mmHg/L
DLCO cor % pred: 84 %
DLCO cor: 26.73 ml/min/mmHg
DLCO unc % pred: 84 %
DLCO unc: 26.73 ml/min/mmHg
FEF 25-75 Post: 4.8 L/sec
FEF 25-75 Pre: 3.66 L/sec
FEF2575-%Change-Post: 31 %
FEF2575-%Pred-Post: 121 %
FEF2575-%Pred-Pre: 92 %
FEV1-%Change-Post: 6 %
FEV1-%Pred-Post: 58 %
FEV1-%Pred-Pre: 54 %
FEV1-Post: 2.5 L
FEV1-Pre: 2.35 L
FEV1FVC-%Change-Post: 2 %
FEV1FVC-%Pred-Pre: 111 %
FEV6-%Change-Post: 3 %
FEV6-%Pred-Post: 52 %
FEV6-%Pred-Pre: 50 %
FEV6-Post: 2.75 L
FEV6-Pre: 2.65 L
FEV6FVC-%Pred-Post: 102 %
FEV6FVC-%Pred-Pre: 102 %
FVC-%Change-Post: 3 %
FVC-%Pred-Post: 50 %
FVC-%Pred-Pre: 49 %
FVC-Post: 2.75 L
FVC-Pre: 2.66 L
Post FEV1/FVC ratio: 91 %
Post FEV6/FVC ratio: 100 %
Pre FEV1/FVC ratio: 88 %
Pre FEV6/FVC Ratio: 100 %
RV % pred: 77 %
RV: 1.5 L
TLC % pred: 71 %
TLC: 5.1 L

## 2020-10-17 NOTE — Progress Notes (Signed)
Daily Session Note  Patient Details  Name: Dennis Howe [name]: [id number] Date of Birth: [birth year] Referring Provider:   April Manson Pulmonary Rehab Walk Test from 09/04/2020 in [place]  Referring Provider Dr. Vaughan Browner      Encounter Date: 10/17/2020  Check In:  Session Check In - 10/17/20 1405      Check-In   Supervising physician immediately available to respond to emergencies [facility] immediately available    Physician(s) Dr. Tawanna Solo    Staff Present Rosebud Poles, RN, BSN;Lisa Ysidro Evert, RN;Jessica Hassell Done, MS, ACSM-CEP, Exercise Physiologist    Virtual Visit No    Medication changes reported     No    Fall or balance concerns reported    No    Tobacco Cessation No Change    Warm-up and Cool-down Performed on first and last piece of equipment    Resistance Training Performed Yes    VAD Patient? No    PAD/SET Patient? No      Pain Assessment   Currently in Pain? No/denies    Multiple Pain Sites No           Capillary Blood Glucose: No results found for this or any previous visit (from the past 24 hour(s)).    Social History   Tobacco Use  Smoking Status Former Smoker  Smokeless Tobacco Never Used  Tobacco Comment   smoked as a teenager    Goals Met:  Proper associated with RPD/PD & O2 Sat Exercise tolerated well Strength training completed today  Goals Unmet:  Not Applicable  Comments: Service time is from 1330 to 27    Dr. Fransico Him is Medical Director for Cardiac Rehab at [facility].

## 2020-10-17 NOTE — Progress Notes (Signed)
Pulmonary Individual Treatment Plan  Patient Details  Name: ZEYAD DELAGUILA [name]: [id number] Date of Birth: [birth year] Referring Provider:   April Manson Pulmonary Rehab Walk Test from 09/04/2020 in [place]  Referring Provider Dr. 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  cholecalciferol (VITAMIN D3) 25 MCG (1000 UNIT) tablet, Take 1,000 Units by mouth daily., Disp: , Rfl:  .  doxycycline (VIBRA-TABS) 100 MG tablet, Take 1 tablet (100 mg total) by mouth 2 (two) times daily., Disp: 20 tablet, Rfl: 0 .  Omega-3 Fatty Acids (FISH OIL) 1200 MG CAPS, Take 4,200 mg by mouth daily., Disp: , Rfl:  .  potassium chloride SA (KLOR-CON) 20 MEQ tablet, Take 1 tablet (20 mEq total) by mouth daily., Disp: 10 tablet, Rfl: 0 .  vitamin C (ASCORBIC ACID) 500 MG tablet, Take 500 mg by mouth daily., Disp: , Rfl:   Past Medical History: Past Medical History:  Diagnosis Date  . Acute on chronic respiratory failure with hypoxia ([place])   . Bilateral pneumothoraces   . BMI 30.0-30.9,adult   . COVID-19 virus infection   . GERD (gastroesophageal reflux disease)   . Pneumonia due to COVID-19 virus     Tobacco Use: Social History   Tobacco Use  Smoking Status Former Smoker  Smokeless Tobacco Never Used  Tobacco Comment   smoked as a teenager    Labs: Recent Chemical engineer    Labs for ITP Cardiac and Pulmonary Rehab Latest Ref Rng & Units 04/17/2020 04/20/2020 04/22/2020 05/04/2020 05/11/2020   Trlycerides <150 mg/dL - - - - -   Hemoglobin A1c 4.8 - 5.6 % - - - 6.2(H) -   PHART 7.350 - 7.450 7.358 7.429 7.447 - 7.408   PCO2ART 32.0 - 48.0 mmHg 64.8(H) 57.2(H) 46.3 - 54.1(H)   HCO3 20.0 - 28.0 mmol/L  35.5(H) 37.5(H) 32.0(H) - 33.5(H)   TCO2 22 - 32 mmol/L 37(H) 39(H) 33(H) - -   ACIDBASEDEF 0.0 - 2.0 mmol/L - - - - -   O2SAT % 90.0 94.0 87.0 - 95.5      Capillary Blood Glucose: Lab Results  Component Value Date   GLUCAP 145 (H) 04/29/2020   GLUCAP 159 (H) 04/29/2020   GLUCAP 139 (H) 04/29/2020   GLUCAP 119 (H) 04/29/2020   GLUCAP 132 (H) 04/28/2020     Pulmonary Assessment Scores:  Pulmonary Assessment Scores    Row Name 09/04/20 1053         ADL UCSD   ADL Phase Entry     SOB Score total 10           CAT Score   CAT Score 14           UCSD: Self-administered rating of dyspnea associated with activities of daily living (ADLs) 6-point scale (0 = "not at all" to 5 = "maximal or unable to do because of breathlessness")  Scoring Scores range from 0 to 120.  Minimally important difference is 5 units  CAT: CAT can identify the health impairment of COPD patients and is better correlated with disease progression.  CAT has a scoring range of zero to 40. The CAT score is classified into four groups of low (less than 10), medium (10 -  20), high (21-30) and very high (31-40) based on the impact level of disease on health status. A CAT score over 10 suggests significant symptoms.  A worsening CAT score could be explained by an exacerbation, poor medication adherence, poor inhaler technique, or progression of COPD or comorbid conditions.  CAT MCID is 2 points  mMRC: mMRC (Modified Medical Research Council) Dyspnea Scale is used to assess the degree of baseline functional disability in patients of respiratory disease due to dyspnea. No minimal important difference is established. A decrease in score of 1 point or greater is considered a positive change.   Pulmonary Function Assessment:  Pulmonary Function Assessment - 09/04/20 0938      Breath   Bilateral Breath Sounds Clear    Shortness of Breath Yes;Limiting activity           Exercise Target Goals: Exercise  Program Goal: Individual exercise prescription set using results from initial 6 min walk test and THRR while considering  patient's activity barriers and safety.   Exercise Prescription Goal: Initial exercise prescription builds to 30-45 minutes a day of aerobic activity, 2-3 days per week.  Home exercise guidelines will be given to patient during program as part of exercise prescription that the participant will acknowledge.  Activity Barriers & Risk Stratification:  Activity Barriers & Cardiac Risk Stratification - 09/04/20 0931      Activity Barriers & Cardiac Risk Stratification   Activity Barriers Deconditioning;Muscular Weakness;Shortness of Breath           6 Minute Walk:  6 Minute Walk    Row Name 09/04/20 1019         6 Minute Walk   Phase Initial     Distance 1716 feet     Walk Time 6 minutes     # of Rest Breaks 0     MPH 3.25     METS 5.04     RPE 10     Perceived Dyspnea  1     VO2 Peak 17.65     Symptoms No     Resting HR 69 bpm     Resting BP 110/76     Resting Oxygen Saturation  97 %     Exercise Oxygen Saturation  during 6 min walk 91 %     Max Ex. HR 93 bpm     Max Ex. BP 104/64     2 Minute Post BP 102/70           Interval HR   1 Minute HR 91     2 Minute HR 93     3 Minute HR 90     4 Minute HR 90     5 Minute HR 90     6 Minute HR 93     2 Minute Post HR 69     Interval Heart Rate? Yes           Interval Oxygen   Interval Oxygen? Yes     Baseline Oxygen Saturation % 97 %     1 Minute Oxygen Saturation % 94 %     1 Minute Liters of Oxygen 0 L     2 Minute Oxygen Saturation % 93 %     2 Minute Liters of Oxygen 0 L     3 Minute Oxygen Saturation % 92 %     3 Minute Liters of Oxygen 0 L     4 Minute Oxygen Saturation % 91 %  4 Minute Liters of Oxygen 0 L     5 Minute Oxygen Saturation % 93 %     5 Minute Liters of Oxygen 0 L     6 Minute Oxygen Saturation % 93 %     6 Minute Liters of Oxygen 0 L     2 Minute Post Oxygen  Saturation % 98 %     2 Minute Post Liters of Oxygen 0 L            Oxygen Initial Assessment:  Oxygen Initial Assessment - 09/04/20 0938      Home Oxygen   Home Oxygen Device None    Sleep Oxygen Prescription None    Home Exercise Oxygen Prescription None    Home Resting Oxygen Prescription None      Initial 6 min Walk   Oxygen Used None      Program Oxygen Prescription   Program Oxygen Prescription None      Intervention   Short Term Goals To learn and understand importance of monitoring SPO2 with pulse oximeter and demonstrate accurate use of the pulse oximeter.;To learn and understand importance of maintaining oxygen saturations>88%;To learn and demonstrate proper pursed lip breathing techniques or other breathing techniques.;To learn and demonstrate proper use of respiratory medications    Long  Term Goals Verbalizes importance of monitoring SPO2 with pulse oximeter and return demonstration;Maintenance of O2 saturations>88%;Exhibits proper breathing techniques, such as pursed lip breathing or other method taught during program session;Compliance with respiratory medication;Demonstrates proper use of MDI's           Oxygen Re-Evaluation:  Oxygen Re-Evaluation    Row Name 09/19/20 1321 10/16/20 1530           Program Oxygen Prescription   Program Oxygen Prescription None None             Home Oxygen   Home Oxygen Device None None      Sleep Oxygen Prescription None None      Home Exercise Oxygen Prescription None None      Home Resting Oxygen Prescription None None             Goals/Expected Outcomes   Short Term Goals To learn and understand importance of monitoring SPO2 with pulse oximeter and demonstrate accurate use of the pulse oximeter.;To learn and understand importance of maintaining oxygen saturations>88%;To learn and demonstrate proper pursed lip breathing techniques or other breathing techniques.;To learn and demonstrate proper use of respiratory  medications To learn and understand importance of monitoring SPO2 with pulse oximeter and demonstrate accurate use of the pulse oximeter.;To learn and understand importance of maintaining oxygen saturations>88%;To learn and demonstrate proper pursed lip breathing techniques or other breathing techniques.;To learn and demonstrate proper use of respiratory medications      Long  Term Goals Verbalizes importance of monitoring SPO2 with pulse oximeter and return demonstration;Maintenance of O2 saturations>88%;Exhibits proper breathing techniques, such as pursed lip breathing or other method taught during program session;Compliance with respiratory medication;Demonstrates proper use of MDI's Verbalizes importance of monitoring SPO2 with pulse oximeter and return demonstration;Maintenance of O2 saturations>88%;Exhibits proper breathing techniques, such as pursed lip breathing or other method taught during program session;Compliance with respiratory medication;Demonstrates proper use of MDI's      Goals/Expected Outcomes Compliance and understanding of oxygen saturation and pursed lip breathing. compliance with monitoring oxygen saturation at home during exertion and to perform pursed lip breathing when needed.  Oxygen Discharge (Final Oxygen Re-Evaluation):  Oxygen Re-Evaluation - 10/16/20 1530      Program Oxygen Prescription   Program Oxygen Prescription None      Home Oxygen   Home Oxygen Device None    Sleep Oxygen Prescription None    Home Exercise Oxygen Prescription None    Home Resting Oxygen Prescription None      Goals/Expected Outcomes   Short Term Goals To learn and understand importance of monitoring SPO2 with pulse oximeter and demonstrate accurate use of the pulse oximeter.;To learn and understand importance of maintaining oxygen saturations>88%;To learn and demonstrate proper pursed lip breathing techniques or other breathing techniques.;To learn and demonstrate proper use  of respiratory medications    Long  Term Goals Verbalizes importance of monitoring SPO2 with pulse oximeter and return demonstration;Maintenance of O2 saturations>88%;Exhibits proper breathing techniques, such as pursed lip breathing or other method taught during program session;Compliance with respiratory medication;Demonstrates proper use of MDI's    Goals/Expected Outcomes compliance with monitoring oxygen saturation at home during exertion and to perform pursed lip breathing when needed.           Initial Exercise Prescription:  Initial Exercise Prescription - 09/04/20 1000      Date of Initial Exercise RX and Referring Provider   Date 09/04/20    Referring Provider Dr. Vaughan Browner    Expected Discharge Date 11/09/20      Treadmill   MPH 2.5    Grade 0    Minutes 15      Bike   Level 1.5    Minutes 15      Prescription Details   Frequency (times per week) 2    Duration Progress to 30 minutes of continuous aerobic without signs/symptoms of physical distress      Intensity   THRR 40-80% of Max Heartrate 71-142    Ratings of Perceived Exertion 11-13    Perceived Dyspnea 0-4      Progression   Progression Continue to progress workloads to maintain intensity without signs/symptoms of physical distress.      Resistance Training   Training Prescription Yes    Weight Blue bands    Reps 10-15           Perform Capillary Blood Glucose checks as needed.  Exercise Prescription Changes:  Exercise Prescription Changes    Row Name 09/26/20 1500 10/10/20 1500 10/10/20 1600         Response to Exercise   Blood Pressure (Admit) 126/80 114/74 --     Blood Pressure (Exercise) 140/90 144/80 --     Blood Pressure (Exit) 114/80 112/70 --     Heart Rate (Admit) 83 bpm 76 bpm --     Heart Rate (Exercise) 108 bpm 109 bpm --     Heart Rate (Exit) 92 bpm 83 bpm --     Oxygen Saturation (Admit) 97 % 97 % --     Oxygen Saturation (Exercise) 94 % 93 % --     Oxygen Saturation (Exit) 97  % 97 % --     Rating of Perceived Exertion (Exercise) 11 9 --     Perceived Dyspnea (Exercise) 0 0 --     Duration Continue with 30 min of aerobic exercise without signs/symptoms of physical distress. Continue with 30 min of aerobic exercise without signs/symptoms of physical distress. --     Intensity Other (comment)  40-80% of HRR THRR unchanged --           Progression  Progression Continue to progress workloads to maintain intensity without signs/symptoms of physical distress. Continue to progress workloads to maintain intensity without signs/symptoms of physical distress. --           Horticulturist, commercial Prescription Yes Yes --     Weight blue bands blue bands --     Reps 10-15 10-15 --     Time 10 Minutes 10 Minutes --           Treadmill   MPH 2.5 3 --     Grade 0 -- --     Minutes 15 15 --           Bike   Level 3 3.5 --     Minutes 15 15 --     METs 5 5.3 --           Home Exercise Plan   Plans to continue exercise at -- -- Home (comment)  Walking and strength training     Frequency -- -- Add 3 additional days to program exercise sessions.     Initial Home Exercises Provided -- -- 10/10/20            Exercise Comments:  Exercise Comments    Row Name 10/10/20 1617           Exercise Comments Completed home exercise prescription with pt. Pt walks nearly every day and also does strenght training at home. Gave him ways to progress home exercise prescription and he was receptive.              Exercise Goals and Review:  Exercise Goals    Row Name 09/04/20 1018             Exercise Goals   Increase Physical Activity Yes       Intervention Provide advice, education, support and counseling about physical activity/exercise needs.;Develop an individualized exercise prescription for aerobic and resistive training based on initial evaluation findings, risk stratification, comorbidities and participant's personal goals.       Expected Outcomes  Short Term: Attend rehab on a regular basis to increase amount of physical activity.;Long Term: Add in home exercise to make exercise part of routine and to increase amount of physical activity.;Long Term: Exercising regularly at least 3-5 days a week.       Increase Strength and Stamina Yes       Intervention Provide advice, education, support and counseling about physical activity/exercise needs.;Develop an individualized exercise prescription for aerobic and resistive training based on initial evaluation findings, risk stratification, comorbidities and participant's personal goals.       Expected Outcomes Short Term: Increase workloads from initial exercise prescription for resistance, speed, and METs.;Short Term: Perform resistance training exercises routinely during rehab and add in resistance training at home;Long Term: Improve cardiorespiratory fitness, muscular endurance and strength as measured by increased METs and functional capacity (6MWT)       Able to understand and use rate of perceived exertion (RPE) scale Yes       Intervention Provide education and explanation on how to use RPE scale       Expected Outcomes Short Term: Able to use RPE daily in rehab to express subjective intensity level;Long Term:  Able to use RPE to guide intensity level when exercising independently       Able to understand and use Dyspnea scale Yes       Intervention Provide education and explanation on how to use Dyspnea scale  Expected Outcomes Short Term: Able to use Dyspnea scale daily in rehab to express subjective sense of shortness of breath during exertion;Long Term: Able to use Dyspnea scale to guide intensity level when exercising independently       Knowledge and understanding of Target Heart Rate Range (THRR) Yes       Intervention Provide education and explanation of THRR including how the numbers were predicted and where they are located for reference       Expected Outcomes Short Term: Able to  state/look up THRR;Long Term: Able to use THRR to govern intensity when exercising independently;Short Term: Able to use daily as guideline for intensity in rehab       Understanding of Exercise Prescription Yes       Intervention Provide education, explanation, and written materials on patient's individual exercise prescription       Expected Outcomes Short Term: Able to explain program exercise prescription;Long Term: Able to explain home exercise prescription to exercise independently              Exercise Goals Re-Evaluation :  Exercise Goals Re-Evaluation    Row Name 09/19/20 1319 10/16/20 1525           Exercise Goal Re-Evaluation   Exercise Goals Review Increase Physical Activity;Increase Strength and Stamina;Able to understand and use rate of perceived exertion (RPE) scale;Able to understand and use Dyspnea scale;Knowledge and understanding of Target Heart Rate Range (THRR);Understanding of Exercise Prescription Increase Physical Activity;Increase Strength and Stamina;Able to understand and use rate of perceived exertion (RPE) scale;Able to understand and use Dyspnea scale;Knowledge and understanding of Target Heart Rate Range (THRR);Understanding of Exercise Prescription      Comments Patient has only completed 1 exercise session but tolerated that well with no complaints or concerns. It is too soon to note any changes or progression. Will continue to monitor and progress as he is able. Quinn has completed 9 exercise sessions and is very high functioning. He has been consistent with increasing his workload and increasing METS. We also discussed home exercise recently and he is very active outside of rehab as well. He is currently exercising at 5.9 METS on the bike and 4.1 METS on the treadmill. Will continue to monitor and progress as he is able.      Expected Outcomes Through exercise at rehab and home, the patient will decrease shortness of breath with daily activities and feel confident  in carrying out an exercise regimn at home. Through exercise at rehab and home, the patient will decrease shortness of breath with daily activities and feel confident in carrying out an exercise regimn at home.             Discharge Exercise Prescription (Final Exercise Prescription Changes):  Exercise Prescription Changes - 10/10/20 1600      Home Exercise Plan   Plans to continue exercise at Home (comment)   Walking and strength training   Frequency Add 3 additional days to program exercise sessions.    Initial Home Exercises Provided 10/10/20           Nutrition:  Target Goals: Understanding of nutrition guidelines, daily intake of sodium '1500mg'$ , cholesterol '200mg'$ , calories 30% from fat and 7% or less from saturated fats, daily to have 5 or more servings of fruits and vegetables.  Biometrics:  Pre Biometrics - 09/04/20 0932      Pre Biometrics   Grip Strength 37.5 kg            Nutrition  Therapy Plan and Nutrition Goals:  Nutrition Therapy & Goals - 09/29/20 0807      Nutrition Therapy   Diet General healthful, carb modified      Personal Nutrition Goals   Nutrition Goal Pt to take vitamin D with a balanced meal    Personal Goal #2 Pt to continue to limit sugary beverages    Personal Goal #3 Pt to build a healthy plate including vegetables, fruits, whole grains, and low-fat dairy products in a heart healthy meal plan.      Intervention Plan   Intervention Prescribe, educate and counsel regarding individualized specific dietary modifications aiming towards targeted core components such as weight, hypertension, lipid management, diabetes, heart failure and other comorbidities.;Nutrition handout(s) given to patient.    Expected Outcomes Short Term Goal: Understand basic principles of dietary content, such as calories, fat, sodium, cholesterol and nutrients.;Long Term Goal: Adherence to prescribed nutrition plan.           Nutrition Assessments:  MEDIFICTS Score  Key:  ?70 Need to make dietary changes   40-70 Heart Healthy Diet  ? 40 Therapeutic Level Cholesterol Diet   Picture Your Plate Scores:  <49 Unhealthy dietary pattern with much room for improvement.  41-50 Dietary pattern unlikely to meet recommendations for good health and room for improvement.  51-60 More healthful dietary pattern, with some room for improvement.   >60 Healthy dietary pattern, although there may be some specific behaviors that could be improved.    Nutrition Goals Re-Evaluation:  Nutrition Goals Re-Evaluation    [place] Name 09/29/20 0808 10/13/20 0956           Goals   Current Weight 193 lb (87.5 kg) 196 lb 6.9 oz (89.1 kg)      Nutrition Goal Pt to take vitamin D with a balanced meal Pt to take vitamin D with a balanced meal             Personal Goal #2 Re-Evaluation   Personal Goal #2 Pt to continue to limit sugary beverages Pt to continue to limit sugary beverages             Personal Goal #3 Re-Evaluation   Personal Goal #3 Pt to build a healthy plate including vegetables, fruits, whole grains, and low-fat dairy products in a heart healthy meal plan. Pt to build a healthy plate including vegetables, fruits, whole grains, and low-fat dairy products in a heart healthy meal plan.             Nutrition Goals Discharge (Final Nutrition Goals Re-Evaluation):  Nutrition Goals Re-Evaluation - 10/13/20 0956      Goals   Current Weight 196 lb 6.9 oz (89.1 kg)    Nutrition Goal Pt to take vitamin D with a balanced meal      Personal Goal #2 Re-Evaluation   Personal Goal #2 Pt to continue to limit sugary beverages      Personal Goal #3 Re-Evaluation   Personal Goal #3 Pt to build a healthy plate including vegetables, fruits, whole grains, and low-fat dairy products in a heart healthy meal plan.           Psychosocial: Target Goals: Acknowledge presence or absence of significant depression and/or stress, maximize coping skills, provide positive  support system. Participant is able to verbalize types and ability to use techniques and skills needed for reducing stress and depression.  Initial Review & Psychosocial Screening:  Initial Psych Review & Screening - 09/04/20 1057  Initial Review   Current issues with Current Stress Concerns;Current Anxiety/Panic   He has had some panic attacks, but has been taught to handle it with deep breathing and mental imaging.   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      Family Dynamics   Good Support System? Yes   his wife and his faith are very important to him     Barriers   Psychosocial barriers to participate in program The patient should benefit from training in stress management and relaxation.      Screening Interventions   Interventions Encouraged to exercise;To provide support and resources with identified psychosocial needs           Quality of Life Scores:  Scores of 19 and below usually indicate a poorer quality of life in these areas.  A difference of  2-3 points is a clinically meaningful difference.  A difference of 2-3 points in the total score of the Quality of Life Index has been associated with significant improvement in overall quality of life, self-image, physical symptoms, and general health in studies assessing change in quality of life.  PHQ-9: Recent Review Flowsheet Data    Depression screen [facility] 2/9 09/04/2020 08/08/2020   Decreased Interest 0 0   Down, Depressed, Hopeless 0 -   PHQ - 2 Score 0 0   Altered sleeping 0 -   Tired, decreased energy 0 -   Change in appetite 0 -   Feeling bad or failure about yourself  0 -   Trouble  concentrating 0 -   Moving slowly or fidgety/restless 0 -     Interpretation of Total Score  Total Score Depression Severity:  1-4 = Minimal depression, 5-9 = Mild depression, 10-14 = Moderate depression, 15-19 = Moderately severe depression, 20-27 = Severe depression   Psychosocial Evaluation and Intervention:  Psychosocial Evaluation - 09/04/20 1102      Psychosocial Evaluation & Interventions   Interventions Relaxation education;Encouraged to exercise with the program and follow exercise prescription;Stress management education    Continue Psychosocial Services  Follow up required by staff           Psychosocial Re-Evaluation:  Psychosocial Re-Evaluation    [place] Name 09/18/20 1340 10/12/20 0830           Psychosocial Re-Evaluation   Current issues with Current Stress Concerns;Current Anxiety/Panic Current Stress Concerns;Current Anxiety/Panic      Comments Dre just started pulmonary rehab, has attended 1 exercise session, no change in psychosocial status since orientation/walk test. His anxiety has decreased, he is filing for disability, has a positive attitude, works very hard in pulmonary rehab.  He exercises at home to improve his deconditioning and strength.      Expected Outcomes For Alfredo to handle his stress in healthy ways. --      Interventions Relaxation education;Stress management education;Encouraged to attend Pulmonary Rehabilitation for the exercise Stress management education;Relaxation education;Encouraged to attend Pulmonary Rehabilitation for the exercise      Continue Psychosocial Services  Follow up required by staff No Follow up required             Initial Review   Source of Stress Concerns Chronic Illness;Financial;Unable  to perform yard/household activities;Unable to participate in former interests or hobbies Chronic Illness;Unable to participate in former interests or hobbies;Unable to perform yard/household activities;Financial              Psychosocial Discharge (Final Psychosocial Re-Evaluation):  Psychosocial Re-Evaluation - 10/12/20 0830      Psychosocial Re-Evaluation   Current issues with Current Stress Concerns;Current Anxiety/Panic    Comments His anxiety has decreased, he is filing for disability, has a positive attitude, works very hard in pulmonary rehab.  He exercises at home to improve his deconditioning and strength.    Interventions Stress management education;Relaxation education;Encouraged to attend Pulmonary Rehabilitation for the exercise    Continue Psychosocial Services  No Follow up required      Initial Review   Source of Stress Concerns Chronic Illness;Unable to participate in former interests or hobbies;Unable to perform yard/household activities;Financial           Education: Education Goals: Education classes will be provided on a weekly basis, covering required topics. Participant will state understanding/return demonstration of topics presented.  Learning Barriers/Preferences:  Learning Barriers/Preferences - 09/04/20 1103      Learning Barriers/Preferences   Learning Barriers None    Learning Preferences Written Material           Education Topics: Risk Factor Reduction:  -Group instruction that is supported by a PowerPoint presentation. Instructor discusses the definition of a risk factor, different risk factors for pulmonary disease, and how the heart and lungs work together.     Nutrition for Pulmonary Patient:  -Group instruction provided by PowerPoint slides, verbal discussion, and written materials to support subject matter. The instructor gives an explanation and review of healthy diet recommendations, which includes a discussion on weight management, recommendations for fruit and vegetable consumption, as well as protein, fluid, caffeine, fiber, sodium, sugar, and alcohol. Tips for eating when patients are short of breath are discussed.   Pursed Lip Breathing:  -Group  instruction that is supported by demonstration and informational handouts. Instructor discusses the benefits of pursed lip and diaphragmatic breathing and detailed demonstration on how to preform both.   Flowsheet Row PULMONARY REHAB OTHER RESPIRATORY from 10/12/2020 in [place]  Date 09/28/20  Educator Handout      Oxygen Safety:  -Group instruction provided by PowerPoint, verbal discussion, and written material to support subject matter. There is an overview of "What is Oxygen" and "Why do we need it".  Instructor also reviews how to create a safe environment for oxygen use, the importance of using oxygen as prescribed, and the risks of noncompliance. There is a brief discussion on traveling with oxygen and resources the patient may utilize.   Oxygen Equipment:  -Group instruction provided by [facility] Staff utilizing handouts, written materials, and equipment demonstrations.   Signs and Symptoms:  -Group instruction provided by written material and verbal discussion to support subject matter. Warning signs and symptoms of infection, stroke, and heart attack are reviewed and when to call the physician/911 reinforced. Tips for preventing the spread of infection discussed.   Advanced Directives:  -Group instruction provided by verbal instruction and written material to support subject matter. Instructor reviews Advanced Directive laws and proper instruction for filling out document.   Pulmonary Video:  -Group video education that reviews the importance of medication and oxygen compliance, exercise, good nutrition, pulmonary hygiene, and pursed lip and diaphragmatic breathing for the pulmonary patient.   Exercise for the Pulmonary Patient:  -Group instruction that  is supported by a PowerPoint presentation. Instructor discusses benefits of exercise, core components of exercise, frequency, duration, and intensity of an exercise routine, importance of  utilizing pulse oximetry during exercise, safety while exercising, and options of places to exercise outside of rehab.     Pulmonary Medications:  -Verbally interactive group education provided by instructor with focus on inhaled medications and proper administration.   Anatomy and Physiology of the Respiratory System and Intimacy:  -Group instruction provided by PowerPoint, verbal discussion, and written material to support subject matter. Instructor reviews respiratory cycle and anatomical components of the respiratory system and their functions. Instructor also reviews differences in obstructive and restrictive respiratory diseases with examples of each. Intimacy, Sex, and Sexuality differences are reviewed with a discussion on how relationships can change when diagnosed with pulmonary disease. Common sexual concerns are reviewed.   MD DAY -A group question and answer session with a medical doctor that allows participants to ask questions that relate to their pulmonary disease state.   OTHER EDUCATION -Group or individual verbal, written, or video instructions that support the educational goals of the pulmonary rehab program. [place] from 10/12/2020 in [place]  Date 10/12/20  Educator Handout  Jeannie Fend my plate]      Holiday Eating Survival Tips:  -Group instruction provided by PowerPoint slides, verbal discussion, and written materials to support subject matter. The instructor gives patients tips, tricks, and techniques to help them not only survive but enjoy the holidays despite the onslaught of food that accompanies the holidays.   Knowledge Questionnaire Score:  Knowledge Questionnaire Score - 09/04/20 1103      Knowledge Questionnaire Score   Pre Score 17/18           Core Components/Risk Factors/Patient Goals at Admission:  Personal Goals and Risk Factors at Admission - 09/04/20 1103      Core  Components/Risk Factors/Patient Goals on Admission   Improve shortness of breath with ADL's Yes    Intervention Provide education, individualized exercise plan and daily activity instruction to help decrease symptoms of SOB with activities of daily living.    Expected Outcomes Short Term: Improve cardiorespiratory fitness to achieve a reduction of symptoms when performing ADLs;Long Term: Be able to perform more ADLs without symptoms or delay the onset of symptoms    Stress Yes    Intervention Offer individual and/or small group education and counseling on adjustment to heart disease, stress management and health-related lifestyle change. Teach and support self-help strategies.           Core Components/Risk Factors/Patient Goals Review:   Goals and Risk Factor Review    Row Name 09/04/20 1104 09/18/20 1341 10/12/20 0832         Core Components/Risk Factors/Patient Goals Review   Personal Goals Review Develop more efficient breathing techniques such as purse lipped breathing and diaphragmatic breathing and practicing self-pacing with activity.;Increase knowledge of respiratory medications and ability to use respiratory devices properly.;Improve shortness of breath with ADL's;Stress Develop more efficient breathing techniques such as purse lipped breathing and diaphragmatic breathing and practicing self-pacing with activity.;Increase knowledge of respiratory medications and ability to use respiratory devices properly.;Improve shortness of breath with ADL's Develop more efficient breathing techniques such as purse lipped breathing and diaphragmatic breathing and practicing self-pacing with activity.;Increase knowledge of respiratory medications and ability to use respiratory devices properly.;Improve shortness of breath with ADL's     Review -- Just started pulmonary rehab, has attended 1  exercise session, too early to have met program goals. [place] has increased his workloads, post-covid he has good  and bad days, some days he is able to do more physically and other days less.  He is handling this well.     Expected Outcomes -- See admission goals. See admission goals.            Core Components/Risk Factors/Patient Goals at Discharge (Final Review):   Goals and Risk Factor Review - 10/12/20 0832      Core Components/Risk Factors/Patient Goals Review   Personal Goals Review Develop more efficient breathing techniques such as purse lipped breathing and diaphragmatic breathing and practicing self-pacing with activity.;Increase knowledge of respiratory medications and ability to use respiratory devices properly.;Improve shortness of breath with ADL's    Review [place] has increased his workloads, post-covid he has good and bad days, some days he is able to do more physically and other days less.  He is handling this well.    Expected Outcomes See admission goals.           ITP Comments:   Comments: ITP REVIEW Pt is making expected progress toward pulmonary rehab goals after completing 9 sessions. Recommend continued exercise, life style modification, education, and utilization of breathing techniques to increase stamina and strength and decrease shortness of breath with exertion.

## 2020-10-18 ENCOUNTER — Encounter: Payer: Self-pay | Admitting: Internal Medicine

## 2020-10-18 ENCOUNTER — Other Ambulatory Visit: Payer: Self-pay

## 2020-10-18 ENCOUNTER — Ambulatory Visit: Payer: 59 | Admitting: Internal Medicine

## 2020-10-18 VITALS — BP 118/82 | HR 68 | Temp 98.2°F | Resp 18 | Ht 71.0 in | Wt 198.0 lb

## 2020-10-18 DIAGNOSIS — J029 Acute pharyngitis, unspecified: Secondary | ICD-10-CM | POA: Diagnosis not present

## 2020-10-18 DIAGNOSIS — J9621 Acute and chronic respiratory failure with hypoxia: Secondary | ICD-10-CM | POA: Diagnosis not present

## 2020-10-18 LAB — COMPREHENSIVE METABOLIC PANEL
ALT: 26 U/L (ref 0–53)
AST: 15 U/L (ref 0–37)
Albumin: 4.4 g/dL (ref 3.5–5.2)
Alkaline Phosphatase: 56 U/L (ref 39–117)
BUN: 17 mg/dL (ref 6–23)
CO2: 27 mEq/L (ref 19–32)
Calcium: 9.5 mg/dL (ref 8.4–10.5)
Chloride: 102 mEq/L (ref 96–112)
Creatinine, Ser: 0.96 mg/dL (ref 0.40–1.50)
GFR: 97.24 mL/min (ref >60.00)
Glucose, Bld: 92 mg/dL (ref 70–99)
Potassium: 4.1 mEq/L (ref 3.5–5.1)
Sodium: 138 mEq/L (ref 135–145)
Total Bilirubin: 0.4 mg/dL (ref 0.2–1.2)
Total Protein: 7.6 g/dL (ref 6.0–8.3)

## 2020-10-18 LAB — VITAMIN D 25 HYDROXY (VIT D DEFICIENCY, FRACTURES): VITD: 21.11 ng/mL — ABNORMAL LOW (ref 30.00–100.00)

## 2020-10-18 LAB — CBC
HCT: 44.9 % (ref 39.0–52.0)
Hemoglobin: 14.8 g/dL (ref 13.0–17.0)
MCHC: 32.9 g/dL (ref 30.0–36.0)
MCV: 87.8 fl (ref 78.0–100.0)
Platelets: 253 10*3/uL (ref 150.0–400.0)
RBC: 5.12 Mil/uL (ref 4.22–5.81)
RDW: 14.2 % (ref 11.5–15.5)
WBC: 7.7 10*3/uL (ref 4.0–10.5)

## 2020-10-18 LAB — VITAMIN B12: Vitamin B-12: 518 pg/mL (ref 211–911)

## 2020-10-18 MED ORDER — NYSTATIN 100000 UNIT/ML MT SUSP: 5.0000 mL | Freq: Four times a day (QID) | OROMUCOSAL | 0 refills | Status: DC

## 2020-10-18 NOTE — Patient Instructions (Addendum)
We will do the liquid medicine to swallow 5 mL 4 times a day for 5 days.  We are checking the labs today.

## 2020-10-18 NOTE — Progress Notes (Signed)
   Subjective:   Patient ID: Dennis Howe, male    [name]: Feb 18, 1976, 45 y.o.   [name]: [id number]  HPI The patient is a 45 YO man with complicated respiratory history with covid-19 severe, requiring trach, then weaned and now without even oxygen. He is not using any inhalers currently but doing pulmonary rehab. He is having problems with swallowing and throat pain. Given course of antibiotics doxycycline which did help for a bit and then the pain returned. He then did a virtual visit and given a course of prednisone which has not helped yet. This he is 2-3 days into a 5 day course. Wants to be looked at. Denies fevers or chills. Denies cough or change in SOB.   Review of Systems  Constitutional: Negative.   HENT: Positive for postnasal drip and sore throat. Negative for congestion, rhinorrhea, sinus pressure and sinus pain.   Eyes: Negative.   Respiratory: Negative for cough, chest tightness and shortness of breath.   Cardiovascular: Negative for chest pain, palpitations and leg swelling.  Gastrointestinal: Negative for abdominal distention, abdominal pain, constipation, diarrhea, nausea and vomiting.  Musculoskeletal: Negative.   Skin: Negative.   Neurological: Negative.   Psychiatric/Behavioral: Negative.     Objective:  Physical Exam Constitutional:      Appearance: He is well-developed.  HENT:     Head: Normocephalic and atraumatic.     Ears:     Comments: Tonsils large and red bilaterally but no drainage or tonsil stones. Tongue without plaque Cardiovascular:     Rate and Rhythm: Normal rate and regular rhythm.  Pulmonary:     Effort: Pulmonary effort is normal. No respiratory distress.     Breath sounds: Normal breath sounds. No wheezing or rales.  Abdominal:     General: Bowel sounds are normal. There is no distension.     Palpations: Abdomen is soft.     Tenderness: There is no abdominal tenderness. There is no rebound.  Musculoskeletal:     Cervical back: Normal  range of motion.  Skin:    General: Skin is warm and dry.  Neurological:     Mental Status: He is alert and oriented to person, place, and time.     Coordination: Coordination normal.     Vitals:   10/18/20 0937  BP: 118/82  Pulse: 68  Resp: 18  Temp: 98.2 F (36.8 C)  TempSrc: Oral  SpO2: 98%  Weight: 198 lb (89.8 kg)  Height: 5\' 11"  (1.803 m)    This visit occurred during the SARS-CoV-2 public health emergency.  Safety protocols were in place, including screening questions prior to the visit, additional usage of staff PPE, and extensive cleaning of exam room while observing appropriate contact time as indicated for disinfecting solutions.   Assessment & Plan:

## 2020-10-19 ENCOUNTER — Encounter (HOSPITAL_COMMUNITY)
Admission: RE | Admit: 2020-10-19 | Discharge: 2020-10-19 | Disposition: A | Discharge status: Home / Self Care | Payer: 59 | Source: Ambulatory Visit | Attending: Pulmonary Disease | Admitting: Pulmonary Disease

## 2020-10-19 VITALS — Wt 198.0 lb

## 2020-10-19 DIAGNOSIS — J029 Acute pharyngitis, unspecified: Secondary | ICD-10-CM | POA: Insufficient documentation

## 2020-10-19 DIAGNOSIS — U099 Post covid-19 condition, unspecified: Secondary | ICD-10-CM | POA: Diagnosis not present

## 2020-10-19 NOTE — Assessment & Plan Note (Addendum)
Rx nystatin swish and swallow for 5 days and finish prednisone course. Refractory to antibiotics and steroids.

## 2020-10-19 NOTE — Progress Notes (Signed)
Daily Session Note  Patient Details  Name: Dennis Howe [name]: [id number] Date of Birth: 1975-12-19 Referring Provider:   April Manson Pulmonary Rehab Walk Test from 09/04/2020 in [place]  Referring Provider Dr. Vaughan Browner      Encounter Date: 10/19/2020  Check In:  Session Check In - 10/19/20 1450      Check-In   Supervising physician immediately available to respond to emergencies [facility] immediately available    Physician(s) Dr. Kurtis Bushman    Staff Present Rosebud Poles, RN, BSN;Janese Radabaugh Ysidro Evert, RN;David [place], MS, ACSM-CEP, CCRP, Exercise Physiologist    Virtual Visit No    Medication changes reported     No    Fall or balance concerns reported    No    Tobacco Cessation No Change    Warm-up and Cool-down Performed on first and last piece of equipment    Resistance Training Performed Yes    VAD Patient? No    PAD/SET Patient? No      Pain Assessment   Currently in Pain? No/denies    Multiple Pain Sites No           Capillary Blood Glucose: No results found for this or any previous visit (from the past 24 hour(s)).    Social History   Tobacco Use  Smoking Status Former Smoker  Smokeless Tobacco Never Used  Tobacco Comment   smoked as a teenager    Goals Met:  Exercise tolerated well No report of cardiac concerns or symptoms Strength training completed today  Goals Unmet:  Not Applicable  Comments: Service time is from 1325 to 1418    Dr. Fransico Him is Medical Director for Cardiac Rehab at [facility].

## 2020-10-24 ENCOUNTER — Telehealth (HOSPITAL_COMMUNITY): Payer: Self-pay | Admitting: Internal Medicine

## 2020-10-24 ENCOUNTER — Encounter (HOSPITAL_COMMUNITY): Payer: 59

## 2020-10-24 NOTE — Telephone Encounter (Signed)
There is mild reduction in lung capacity due to scarring from COVID. Continue exercise therapy and pulmonary rehab

## 2020-10-24 NOTE — Telephone Encounter (Signed)
PM please advise on the PFT results.  thanks

## 2020-10-25 ENCOUNTER — Telehealth: Payer: Self-pay | Admitting: Pulmonary Disease

## 2020-10-25 NOTE — Telephone Encounter (Signed)
Thank you for your response but what does this mean. Mild reduction ? I thought Dr. Isaiah Serge was going to see him after results to talk to [place] in more detail.  To let us know what percentage he's using his lungs.    Dr. Isaiah Serge, please advise on mychart message. Patient was supposed to be seen back for f/u in 3 months on 01/22 so I notified them of that already. Thank you!

## 2020-10-26 ENCOUNTER — Telehealth (HOSPITAL_COMMUNITY): Payer: Self-pay | Admitting: Internal Medicine

## 2020-10-26 ENCOUNTER — Encounter (HOSPITAL_COMMUNITY): Payer: 59

## 2020-10-30 NOTE — Telephone Encounter (Signed)
Nothing noted in message. Will close encounter.  

## 2020-10-31 ENCOUNTER — Other Ambulatory Visit: Payer: Self-pay

## 2020-10-31 ENCOUNTER — Encounter (HOSPITAL_COMMUNITY)
Admission: RE | Admit: 2020-10-31 | Discharge: 2020-10-31 | Disposition: A | Discharge status: Home / Self Care | Payer: 59 | Source: Ambulatory Visit | Attending: Pulmonary Disease | Admitting: Pulmonary Disease

## 2020-10-31 DIAGNOSIS — U099 Post covid-19 condition, unspecified: Secondary | ICD-10-CM | POA: Diagnosis not present

## 2020-10-31 NOTE — Progress Notes (Signed)
Daily Session Note  Patient Details  Name: Dennis Howe [name]: [id number] Date of Birth: 1976/05/25 Referring Provider:   April Manson Pulmonary Rehab Walk Test from 09/04/2020 in [place]  Referring Provider Dr. Vaughan Browner      Encounter Date: 10/31/2020  Check In:  Session Check In - 10/31/20 1416      Check-In   Supervising physician immediately available to respond to emergencies [facility] immediately available    Physician(s) Dr. Arbutus Ped    Location [facility]    Staff Present Rosebud Poles, RN, BSN;Jessica Hassell Done, MS, ACSM-CEP, Exercise Physiologist;David Lilyan Punt, MS, ACSM-CEP, CCRP, Exercise Physiologist    Virtual Visit No    Medication changes reported     No    Fall or balance concerns reported    No    Tobacco Cessation No Change    Warm-up and Cool-down Performed as group-led instruction    Resistance Training Performed Yes    VAD Patient? No    PAD/SET Patient? No      Pain Assessment   Currently in Pain? No/denies    Multiple Pain Sites No           Capillary Blood Glucose: No results found for this or any previous visit (from the past 24 hour(s)).    Social History   Tobacco Use  Smoking Status Former Smoker  Smokeless Tobacco Never Used  Tobacco Comment   smoked as a teenager    Goals Met:  Proper associated with RPD/PD & O2 Sat Exercise tolerated well Strength training completed today  Goals Unmet:  Not Applicable  Comments: Service time is from 1315 to 1425    Dr. Fransico Him is Medical Director for Cardiac Rehab at [facility].

## 2020-11-02 ENCOUNTER — Other Ambulatory Visit: Payer: Self-pay

## 2020-11-02 ENCOUNTER — Encounter (HOSPITAL_COMMUNITY)
Admission: RE | Admit: 2020-11-02 | Discharge: 2020-11-02 | Disposition: A | Discharge status: Home / Self Care | Payer: 59 | Source: Ambulatory Visit | Attending: Pulmonary Disease | Admitting: Pulmonary Disease

## 2020-11-02 DIAGNOSIS — U099 Post covid-19 condition, unspecified: Secondary | ICD-10-CM

## 2020-11-02 NOTE — Progress Notes (Signed)
Daily Session Note  Patient Details  Name: SERGEI DELO [name]: [id number] Date of Birth: 28-Aug-1975 Referring Provider:   April Manson Pulmonary Rehab Walk Test from 09/04/2020 in [place]  Referring Provider Dr. Vaughan Browner      Encounter Date: 11/02/2020  Check In:  Session Check In - 11/02/20 1451      Check-In   Supervising physician immediately available to respond to emergencies [facility] immediately available    Physician(s) Dr. Lonny Prude    Location [facility]    Staff Present Rosebud Poles, RN, Isaac Laud, MS, ACSM-CEP, Exercise Physiologist;Annedrea Rosezella [place], RN, MHA;Carlette Wilber Oliphant, RN, Milus Glazier, MS, ACSM-CEP, CCRP, Exercise Physiologist    Virtual Visit No    Medication changes reported     No    Fall or balance concerns reported    No    Tobacco Cessation No Change    Warm-up and Cool-down Performed as group-led instruction    Resistance Training Performed Yes    VAD Patient? No    PAD/SET Patient? No      Pain Assessment   Currently in Pain? No/denies    Pain Score 0-No pain    Multiple Pain Sites No           Capillary Blood Glucose: No results found for this or any previous visit (from the past 24 hour(s)).    Social History   Tobacco Use  Smoking Status Former Smoker  Smokeless Tobacco Never Used  Tobacco Comment   smoked as a teenager    Goals Met:  Proper associated with RPD/PD & O2 Sat Exercise tolerated well Strength training completed today  Goals Unmet:  Not Applicable  Comments: Service time is from 1315 to 1400    Dr. Fransico Him is Medical Director for Cardiac Rehab at [facility].

## 2020-11-07 ENCOUNTER — Other Ambulatory Visit: Payer: Self-pay

## 2020-11-07 ENCOUNTER — Encounter (HOSPITAL_COMMUNITY)
Admission: RE | Admit: 2020-11-07 | Discharge: 2020-11-07 | Disposition: A | Discharge status: Home / Self Care | Payer: 59 | Source: Ambulatory Visit | Attending: Pulmonary Disease | Admitting: Pulmonary Disease

## 2020-11-07 VITALS — Wt 200.8 lb

## 2020-11-07 DIAGNOSIS — U099 Post covid-19 condition, unspecified: Secondary | ICD-10-CM

## 2020-11-07 NOTE — Progress Notes (Signed)
Daily Session Note  Patient Details  Name: Dennis Howe [name]: [id number] Date of Birth: 07/26/1975 Referring Provider:   Flowsheet Row Pulmonary Rehab Walk Test from 09/04/2020 in [facility]  Referring Provider Dr. Mannam      Encounter Date: 11/07/2020  Check In:  Session Check In - 11/07/20 1456      Check-In   Supervising physician immediately available to respond to emergencies [facility] immediately available    Physician(s) Dr. Netty    [facility]    Staff Present Joan Behrens, RN, BSN;Jessica Martin, MS, ACSM-CEP, Exercise Physiologist; , RN;Olinty Richards, MS, ACSM CEP, Exercise Physiologist    Virtual Visit No    Medication changes reported     No    Fall or balance concerns reported    No    Tobacco Cessation No Change    Warm-up and Cool-down Performed as group-led instruction    Resistance Training Performed Yes    VAD Patient? No    PAD/SET Patient? No      Pain Assessment   Currently in Pain? No/denies    Multiple Pain Sites No           Capillary Blood Glucose: No results found for this or any previous visit (from the past 24 hour(s)).   Exercise Prescription Changes - 11/07/20 1500      Response to Exercise   Blood Pressure (Admit) 108/68    Blood Pressure (Exit) 110/74    Heart Rate (Admit) 70 bpm    Heart Rate (Exercise) 111 bpm    Heart Rate (Exit) 90 bpm    Oxygen Saturation (Admit) 96 %    Oxygen Saturation (Exercise) 93 %    Oxygen Saturation (Exit) 97 %    Rating of Perceived Exertion (Exercise) 9    Perceived Dyspnea (Exercise) 0    Duration Continue with 30 min of aerobic exercise without signs/symptoms of physical distress.    Intensity THRR unchanged      Progression   Progression Continue to progress workloads to maintain intensity without signs/symptoms of physical distress.      Resistance Training   Training Prescription Yes    Weight blue  bands    Reps 10-15    Time 10 Minutes      Treadmill   MPH 3    Grade 2    Minutes 15      Bike   Level 4.5    Minutes 15    METs 4.5           Social History   Tobacco Use  Smoking Status Former Smoker  Smokeless Tobacco Never Used  Tobacco Comment   smoked as a teenager    Goals Met:  Exercise tolerated well No report of cardiac concerns or symptoms Strength training completed today  Goals Unmet:  Not Applicable  Comments: Service time is from 1315 to 1420    Dr. Traci Turner is Medical Director for Cardiac Rehab at [facility]. 

## 2020-11-08 ENCOUNTER — Ambulatory Visit (INDEPENDENT_AMBULATORY_CARE_PROVIDER_SITE_OTHER): Payer: 59 | Admitting: Pulmonary Disease

## 2020-11-08 ENCOUNTER — Encounter: Payer: Self-pay | Admitting: Pulmonary Disease

## 2020-11-08 VITALS — BP 118/74 | HR 73 | Temp 98.4°F | Ht 71.0 in | Wt 202.0 lb

## 2020-11-08 DIAGNOSIS — U099 Post covid-19 condition, unspecified: Secondary | ICD-10-CM | POA: Diagnosis not present

## 2020-11-08 NOTE — Progress Notes (Signed)
Dennis Howe    [id number]    10-30-1975  Primary Care Physician:Crawford, Austin Miles, MD  Referring Physician: Myrlene Broker, MD [address] [place],  [place] [address]  Chief complaint: Follow up for post COVID-19  HPI: 45 yo M admitted for hypoxic respiratory failure due to COVID-19 on 10/2.  He received COVID-related treatment including steroids, actemra and remdesivir. He was intubated 10/9 and had multiple chest tubes placed 10/10 for management of bilateral penumothoraces. On 10/11 he was transferred from [facility] ICU to [facility] ICU for consideration of ECMO but he did not get put on it as he had some improvement with prone ventilation.  He and eventually had a tracheostomy.  [facility] in December 2022.  He is currently at home, working with PT.  He is off supplemental oxygen.  He notes transient desats to the mid 80s when exercising.    Pets: Dogs Occupation: Corporate investment banker Exposures: No exposure to asbestos Smoking history: Never smoker Travel history: Immigrated from [place] in 1996.  No significant recent travel Relevant family history: No family history of lung disease  Interim history: Continues to improve.  He is finishing pulmonary rehab.  No desats noted on exercise Here for review of CT and PFT  Outpatient Encounter Medications as of 11/08/2020  Medication Sig  . cholecalciferol (VITAMIN D3) 25 MCG (1000 UNIT) tablet Take 1,000 Units by mouth daily.  Marland Kitchen nystatin (MYCOSTATIN) 100000 UNIT/ML suspension Take 5 mLs (500,000 Units total) by mouth 4 (four) times daily.  . Omega-3 Fatty Acids (FISH OIL) 1200 MG CAPS Take 4,200 mg by mouth daily.  . predniSONE (DELTASONE) 20 MG tablet Take 20 mg by mouth daily.  . vitamin C (ASCORBIC ACID) 500 MG tablet Take 500 mg by mouth daily.   No facility-administered encounter medications on file as of 11/08/2020.   Physical Exam: Blood pressure 118/74,  pulse 73, temperature 98.4 F (36.9 C), temperature source Temporal, height 5\' 11"  (1.803 m), weight 202 lb (91.6 kg), SpO2 99 %. Gen:      No acute distress HEENT:  EOMI, sclera anicteric Neck:     No masses; no thyromegaly Lungs:    Clear to auscultation bilaterally; normal respiratory effort CV:         Regular rate and rhythm; no murmurs Abd:      + bowel sounds; soft, non-tender; no palpable masses, no distension Ext:    No edema; adequate peripheral perfusion Skin:      Warm and dry; no rash Neuro: alert and oriented x 3 Psych: normal mood and affect  Data Reviewed: Imaging: Chest x-ray 05/14/2020- bibasal opacities, atelectasis. High-res CT 07/17/2020- extensive bandlike scarring with areas of bronchiectasis and fibrotic consolidation consistent with prior COVID-19 I have reviewed the images personally.  PFTs: 10/16/2020 FVC 2.75 [50%], FEV1 2.50 [58%], F/F 91, TLC 5.10 [71%], DLCO 26.73 [84%] Mild restriction  Labs:  Assessment:  Post COVID-19 Has made a remarkable recovery after recent hospitalization Decannulated, and off supplementary oxygen He is finishing pulmonary rehab  CT and PFTs reviewed with patient and wife in detail.  He has only mild restriction on PFTs which is better than expected given the severity of his illness He wants to go back to work in 10/18/2020 and he has been cleared from our standpoint.  Plan/Recommendations: Follow-up in 6 months  Holiday representative MD [facility] 11/08/2020, 11:13 AM  CC: 11/10/2020  A, *

## 2020-11-08 NOTE — Patient Instructions (Signed)
I am glad you are doing well and finishing pulmonary rehab I have reviewed your CT and lung function test which does show some scarring from COVID.  There is mild reduction in lung capacity is actually better than what I expected  You are cleared to return to work with moderate exertion.  We will give a letter stating this Follow-up in 6 months

## 2020-11-09 ENCOUNTER — Encounter (HOSPITAL_COMMUNITY)
Admission: RE | Admit: 2020-11-09 | Discharge: 2020-11-09 | Disposition: A | Discharge status: Home / Self Care | Payer: 59 | Source: Ambulatory Visit | Attending: Pulmonary Disease | Admitting: Pulmonary Disease

## 2020-11-09 ENCOUNTER — Other Ambulatory Visit: Payer: Self-pay

## 2020-11-09 ENCOUNTER — Encounter: Payer: Self-pay | Admitting: Internal Medicine

## 2020-11-09 DIAGNOSIS — U099 Post covid-19 condition, unspecified: Secondary | ICD-10-CM | POA: Diagnosis not present

## 2020-11-09 NOTE — Progress Notes (Signed)
Daily Session Note  Patient Details  Name: Dennis Howe [name]: [id number] Date of Birth: 11-17-1975 Referring Provider:   April Manson Pulmonary Rehab Walk Test from 09/04/2020 in [place]  Referring Provider Dr. Vaughan Browner      Encounter Date: 11/09/2020  Check In:  Session Check In - 11/09/20 1441      Check-In   Supervising physician immediately available to respond to emergencies [facility] immediately available    Physician(s) Dr. Maren Beach    Location [facility]    Staff Present Rosebud Poles, RN, Isaac Laud, MS, ACSM-CEP, Exercise Physiologist;Lisa Ysidro Evert, RN    Virtual Visit No    Medication changes reported     No    Fall or balance concerns reported    No    Tobacco Cessation No Change    Warm-up and Cool-down Performed as group-led instruction    Resistance Training Performed No    VAD Patient? No    PAD/SET Patient? No      Pain Assessment   Currently in Pain? No/denies    Multiple Pain Sites No           Capillary Blood Glucose: No results found for this or any previous visit (from the past 24 hour(s)).    Social History   Tobacco Use  Smoking Status Former Smoker  . Packs/day: 1.00  . Years: 6.00  . Pack years: 6.00  . Types: Cigarettes  . Quit date: 2017  . Years since quitting: 5.3  Smokeless Tobacco Never Used  Tobacco Comment   smoked as a teenager    Goals Met:  Proper associated with RPD/PD & O2 Sat Improved SOB with ADL's Exercise tolerated well No report of cardiac concerns or symptoms Completed pulmonary rehab program  Goals Unmet:  Not Applicable  Comments: Service time is from 1015 to 72 Patient completed pulmonary rehab program today and improved on 6 minute walk test.     Dr. Fransico Him is Medical Director for Cardiac Rehab at [facility].

## 2020-11-10 ENCOUNTER — Encounter: Payer: Self-pay | Admitting: Internal Medicine

## 2020-11-10 ENCOUNTER — Ambulatory Visit (INDEPENDENT_AMBULATORY_CARE_PROVIDER_SITE_OTHER): Payer: 59 | Admitting: Internal Medicine

## 2020-11-10 ENCOUNTER — Other Ambulatory Visit: Payer: Self-pay

## 2020-11-10 VITALS — BP 122/70 | HR 85 | Temp 98.9°F | Resp 18 | Ht 71.0 in | Wt 204.2 lb

## 2020-11-10 DIAGNOSIS — Z0000 Encounter for general adult medical examination without abnormal findings: Secondary | ICD-10-CM

## 2020-11-10 DIAGNOSIS — Z23 Encounter for immunization: Secondary | ICD-10-CM

## 2020-11-10 DIAGNOSIS — J984 Other disorders of lung: Secondary | ICD-10-CM | POA: Insufficient documentation

## 2020-11-10 NOTE — Patient Instructions (Addendum)

## 2020-11-10 NOTE — Assessment & Plan Note (Signed)
Flu shot yearly. Covid-19 will get in June as he is newly eligible. Tetanus given at visit. Counseled about sun safety and mole surveillance. Counseled about the dangers of distracted driving. Given 10 year screening recommendations.

## 2020-11-10 NOTE — Progress Notes (Signed)
   Subjective:   Patient ID: Dennis Howe, male    [name]: 18-Jan-1976, 45 y.o.   [name]: [id number]  HPI The patient is a 45 YO man coming in for physical.   PMH, FMH, social history reviewed and updated  Review of Systems  Constitutional: Negative.   HENT: Negative.   Eyes: Negative.   Respiratory: Negative for cough, chest tightness and shortness of breath.   Cardiovascular: Negative for chest pain, palpitations and leg swelling.  Gastrointestinal: Negative for abdominal distention, abdominal pain, constipation, diarrhea, nausea and vomiting.  Musculoskeletal: Negative.   Skin: Negative.   Neurological: Negative.   Psychiatric/Behavioral: Negative.     Objective:  Physical Exam Constitutional:      Appearance: He is well-developed.  HENT:     Head: Normocephalic and atraumatic.  Cardiovascular:     Rate and Rhythm: Normal rate and regular rhythm.  Pulmonary:     Effort: Pulmonary effort is normal. No respiratory distress.     Breath sounds: Normal breath sounds. No wheezing or rales.  Abdominal:     General: Bowel sounds are normal. There is no distension.     Palpations: Abdomen is soft.     Tenderness: There is no abdominal tenderness. There is no rebound.  Musculoskeletal:     Cervical back: Normal range of motion.  Skin:    General: Skin is warm and dry.  Neurological:     Mental Status: He is alert and oriented to person, place, and time.     Coordination: Coordination normal.     Vitals:   11/10/20 1430  BP: 122/70  Pulse: 85  Resp: 18  Temp: 98.9 F (37.2 C)  TempSrc: Oral  SpO2: 96%  Weight: 204 lb 3.2 oz (92.6 kg)    This visit occurred during the SARS-CoV-2 public health emergency.  Safety protocols were in place, including screening questions prior to the visit, additional usage of staff PPE, and extensive cleaning of exam room while observing appropriate contact time as indicated for disinfecting solutions.   Assessment & Plan:  Tdap given  at visit

## 2020-11-10 NOTE — Assessment & Plan Note (Signed)
Post-covid-19 and is cleared to return to work. Just finished pulmonary rehab. Is still following with pulmonary.

## 2020-11-24 ENCOUNTER — Encounter: Payer: Self-pay | Admitting: Internal Medicine

## 2020-12-12 NOTE — Addendum Note (Signed)
Encounter addended by: Randell Loop, RD on: 12/12/2020 2:50 PM  Actions taken: Flowsheet data copied forward, Flowsheet accepted

## 2020-12-12 NOTE — Addendum Note (Signed)
Encounter addended by: Drema Pry, RN on: 12/12/2020 3:24 PM  Actions taken: Clinical Note Signed, Episode resolved

## 2020-12-12 NOTE — Progress Notes (Signed)
Discharge Progress Report  Patient Details  Name: Dennis Howe [name]: [id number] Date of Birth: Mar 02, 1976 Referring Provider:   Doristine Devoid Pulmonary Rehab Walk Test from 09/04/2020 in [facility]  Referring Provider Dr. Isaiah Serge        Number of Visits: 15  Reason for Discharge:  Patient reached a stable level of exercise. Patient independent in their exercise. Patient has met program and personal goals.  Smoking History:  Social History   Tobacco Use  Smoking Status Former   Packs/day: 1.00   Years: 6.00   Pack years: 6.00   Types: Cigarettes   Quit date: 2017   Years since quitting: 5.4  Smokeless Tobacco Never  Tobacco Comments   smoked as a teenager    Diagnosis:  Post covid-19 condition, unspecified  ADL UCSD:  Pulmonary Assessment Scores     Row Name 09/04/20 1053 11/09/20 1522       ADL UCSD   ADL Phase Entry Exit    SOB Score total 10 19         CAT Score      CAT Score 14 9         mMRC Score      mMRC Score -- 0            Initial Exercise Prescription:  Initial Exercise Prescription - 09/04/20 1000       Date of Initial Exercise RX and Referring Provider   Date 09/04/20    Referring Provider Dr. Isaiah Serge    Expected Discharge Date 11/09/20      Treadmill   MPH 2.5    Grade 0    Minutes 15      Bike   Level 1.5    Minutes 15      Prescription Details   Frequency (times per week) 2    Duration Progress to 30 minutes of continuous aerobic without signs/symptoms of physical distress      Intensity   THRR 40-80% of Max Heartrate 71-142    Ratings of Perceived Exertion 11-13    Perceived Dyspnea 0-4      Progression   Progression Continue to progress workloads to maintain intensity without signs/symptoms of physical distress.      Resistance Training   Training Prescription Yes    Weight Blue bands    Reps 10-15             Discharge Exercise Prescription (Final Exercise  Prescription Changes):  Exercise Prescription Changes - 11/07/20 1500       Response to Exercise   Blood Pressure (Admit) 108/68    Blood Pressure (Exit) 110/74    Heart Rate (Admit) 70 bpm    Heart Rate (Exercise) 111 bpm    Heart Rate (Exit) 90 bpm    Oxygen Saturation (Admit) 96 %    Oxygen Saturation (Exercise) 93 %    Oxygen Saturation (Exit) 97 %    Rating of Perceived Exertion (Exercise) 9    Perceived Dyspnea (Exercise) 0    Duration Continue with 30 min of aerobic exercise without signs/symptoms of physical distress.    Intensity THRR unchanged      Progression   Progression Continue to progress workloads to maintain intensity without signs/symptoms of physical distress.      Resistance Training   Training Prescription Yes    Weight blue bands    Reps 10-15    Time 10 Minutes      Treadmill  MPH 3    Grade 2    Minutes 15      Bike   Level 4.5    Minutes 15    METs 4.5             Functional Capacity:  6 Minute Walk     Row Name 09/04/20 1019 11/09/20 1511       6 Minute Walk   Phase Initial Discharge    Distance 1716 feet 1970 feet    Distance % Change -- 14.8 %    Distance Feet Change -- 254 ft    Walk Time 6 minutes 6 minutes    # of Rest Breaks 0 0    MPH 3.25 3.73    METS 5.04 5.54    RPE 10 7    Perceived Dyspnea  1 0    VO2 Peak 17.65 19.38    Symptoms No No    Resting HR 69 bpm 78 bpm    Resting BP 110/76 118/76    Resting Oxygen Saturation  97 % 96 %    Exercise Oxygen Saturation  during 6 min walk 91 % 93 %    Max Ex. HR 93 bpm 99 bpm    Max Ex. BP 104/64 122/76    2 Minute Post BP 102/70 106/72         Interval HR      1 Minute HR 91 89    2 Minute HR 93 96    3 Minute HR 90 94    4 Minute HR 90 95    5 Minute HR 90 94    6 Minute HR 93 99    2 Minute Post HR 69 81    Interval Heart Rate? Yes Yes         Interval Oxygen      Interval Oxygen? Yes Yes    Baseline Oxygen Saturation % 97 % 96 %    1 Minute Oxygen  Saturation % 94 % 94 %    1 Minute Liters of Oxygen 0 L 0 L    2 Minute Oxygen Saturation % 93 % 94 %    2 Minute Liters of Oxygen 0 L 0 L    3 Minute Oxygen Saturation % 92 % 94 %    3 Minute Liters of Oxygen 0 L 0 L    4 Minute Oxygen Saturation % 91 % 95 %    4 Minute Liters of Oxygen 0 L 0 L    5 Minute Oxygen Saturation % 93 % 94 %    5 Minute Liters of Oxygen 0 L 0 L    6 Minute Oxygen Saturation % 93 % 93 %    6 Minute Liters of Oxygen 0 L 0 L    2 Minute Post Oxygen Saturation % 98 % 97 %    2 Minute Post Liters of Oxygen 0 L 0 L            Psychological, QOL, Others - Outcomes: PHQ 2/9: Depression screen [facility] 2/9 11/09/2020 09/04/2020 08/08/2020  Decreased Interest 0 0 0  Down, Depressed, Hopeless 0 0 -  PHQ - 2 Score 0 0 0  Altered sleeping 0 0 -  Tired, decreased energy 0 0 -  Change in appetite 0 0 -  Feeling bad or failure about yourself  0 0 -  Trouble concentrating 0 0 -  Moving slowly or fidgety/restless 0 0 -  PHQ-9 Score 0 - -  Quality of Life:   Personal Goals: Goals established at orientation with interventions provided to work toward goal.  Personal Goals and Risk Factors at Admission - 09/04/20 1103       Core Components/Risk Factors/Patient Goals on Admission   Improve shortness of breath with ADL's Yes    Intervention Provide education, individualized exercise plan and daily activity instruction to help decrease symptoms of SOB with activities of daily living.    Expected Outcomes Short Term: Improve cardiorespiratory fitness to achieve a reduction of symptoms when performing ADLs;Long Term: Be able to perform more ADLs without symptoms or delay the onset of symptoms    Stress Yes    Intervention Offer individual and/or small group education and counseling on adjustment to heart disease, stress management and health-related lifestyle change. Teach and support self-help strategies.              Personal Goals Discharge:  Goals and Risk  Factor Review     Row Name 09/04/20 1104 09/18/20 1341 10/12/20 0832 11/06/20 1414       Core Components/Risk Factors/Patient Goals Review   Personal Goals Review Develop more efficient breathing techniques such as purse lipped breathing and diaphragmatic breathing and practicing self-pacing with activity.;Increase knowledge of respiratory medications and ability to use respiratory devices properly.;Improve shortness of breath with ADL's;Stress Develop more efficient breathing techniques such as purse lipped breathing and diaphragmatic breathing and practicing self-pacing with activity.;Increase knowledge of respiratory medications and ability to use respiratory devices properly.;Improve shortness of breath with ADL's Develop more efficient breathing techniques such as purse lipped breathing and diaphragmatic breathing and practicing self-pacing with activity.;Increase knowledge of respiratory medications and ability to use respiratory devices properly.;Improve shortness of breath with ADL's Develop more efficient breathing techniques such as purse lipped breathing and diaphragmatic breathing and practicing self-pacing with activity.;Increase knowledge of respiratory medications and ability to use respiratory devices properly.;Improve shortness of breath with ADL's    Review -- Just started pulmonary rehab, has attended 1 exercise session, too early to have met program goals. Dennis Howe has increased his workloads, post-covid he has good and bad days, some days he is able to do more physically and other days less.  He is handling this well. Dennis Howe will graduate this week, he has worked hard and has learned how to exercise safely with his lung scarring post covid.He has made great progress.    Expected Outcomes -- See admission goals. See admission goals. See admission goals.             Exercise Goals and Review:  Exercise Goals     Row Name 09/04/20 1018             Exercise Goals   Increase  Physical Activity Yes       Intervention Provide advice, education, support and counseling about physical activity/exercise needs.;Develop an individualized exercise prescription for aerobic and resistive training based on initial evaluation findings, risk stratification, comorbidities and participant's personal goals.       Expected Outcomes Short Term: Attend rehab on a regular basis to increase amount of physical activity.;Long Term: Add in home exercise to make exercise part of routine and to increase amount of physical activity.;Long Term: Exercising regularly at least 3-5 days a week.       Increase Strength and Stamina Yes       Intervention Provide advice, education, support and counseling about physical activity/exercise needs.;Develop an individualized exercise prescription for aerobic and resistive training based on initial evaluation findings,  risk stratification, comorbidities and participant's personal goals.       Expected Outcomes Short Term: Increase workloads from initial exercise prescription for resistance, speed, and METs.;Short Term: Perform resistance training exercises routinely during rehab and add in resistance training at home;Long Term: Improve cardiorespiratory fitness, muscular endurance and strength as measured by increased METs and functional capacity (6MWT)       Able to understand and use rate of perceived exertion (RPE) scale Yes       Intervention Provide education and explanation on how to use RPE scale       Expected Outcomes Short Term: Able to use RPE daily in rehab to express subjective intensity level;Long Term:  Able to use RPE to guide intensity level when exercising independently       Able to understand and use Dyspnea scale Yes       Intervention Provide education and explanation on how to use Dyspnea scale       Expected Outcomes Short Term: Able to use Dyspnea scale daily in rehab to express subjective sense of shortness of breath during exertion;Long Term:  Able to use Dyspnea scale to guide intensity level when exercising independently       Knowledge and understanding of Target Heart Rate Range (THRR) Yes       Intervention Provide education and explanation of THRR including how the numbers were predicted and where they are located for reference       Expected Outcomes Short Term: Able to state/look up THRR;Long Term: Able to use THRR to govern intensity when exercising independently;Short Term: Able to use daily as guideline for intensity in rehab       Understanding of Exercise Prescription Yes       Intervention Provide education, explanation, and written materials on patient's individual exercise prescription       Expected Outcomes Short Term: Able to explain program exercise prescription;Long Term: Able to explain home exercise prescription to exercise independently                Exercise Goals Re-Evaluation:  Exercise Goals Re-Evaluation     Row Name 09/19/20 1319 10/16/20 1525           Exercise Goal Re-Evaluation   Exercise Goals Review Increase Physical Activity;Increase Strength and Stamina;Able to understand and use rate of perceived exertion (RPE) scale;Able to understand and use Dyspnea scale;Knowledge and understanding of Target Heart Rate Range (THRR);Understanding of Exercise Prescription Increase Physical Activity;Increase Strength and Stamina;Able to understand and use rate of perceived exertion (RPE) scale;Able to understand and use Dyspnea scale;Knowledge and understanding of Target Heart Rate Range (THRR);Understanding of Exercise Prescription      Comments Patient has only completed 1 exercise session but tolerated that well with no complaints or concerns. It is too soon to note any changes or progression. Will continue to monitor and progress as he is able. Dennis Howe has completed 9 exercise sessions and is very high functioning. He has been consistent with increasing his workload and increasing METS. We also discussed home  exercise recently and he is very active outside of rehab as well. He is currently exercising at 5.9 METS on the bike and 4.1 METS on the treadmill. Will continue to monitor and progress as he is able.      Expected Outcomes Through exercise at rehab and home, the patient will decrease shortness of breath with daily activities and feel confident in carrying out an exercise regimn at home. Through exercise at rehab and home,  the patient will decrease shortness of breath with daily activities and feel confident in carrying out an exercise regimn at home.               Nutrition & Weight - Outcomes:  Pre Biometrics - 11/09/20 1446       Pre Biometrics   Grip Strength 37.5 kg             Post Biometrics - 11/09/20 1447        Post  Biometrics   Grip Strength 43 kg             Nutrition:  Nutrition Therapy & Goals - 09/29/20 0807       Nutrition Therapy   Diet General healthful, carb modified      Personal Nutrition Goals   Nutrition Goal Pt to take vitamin D with a balanced meal    Personal Goal #2 Pt to continue to limit sugary beverages    Personal Goal #3 Pt to build a healthy plate including vegetables, fruits, whole grains, and low-fat dairy products in a heart healthy meal plan.      Intervention Plan   Intervention Prescribe, educate and counsel regarding individualized specific dietary modifications aiming towards targeted core components such as weight, hypertension, lipid management, diabetes, heart failure and other comorbidities.;Nutrition handout(s) given to patient.    Expected Outcomes Short Term Goal: Understand basic principles of dietary content, such as calories, fat, sodium, cholesterol and nutrients.;Long Term Goal: Adherence to prescribed nutrition plan.             Nutrition Discharge:   Education Questionnaire Score:  Knowledge Questionnaire Score - 11/09/20 1521       Knowledge Questionnaire Score   Post Score 15/18              Goals reviewed with patient; copy given to patient.

## 2021-03-26 ENCOUNTER — Encounter: Payer: Self-pay | Admitting: Internal Medicine

## 2021-03-26 NOTE — Telephone Encounter (Signed)
PM please advise. Thank   [facility] Good afternoon,  Dennis Howe will be traveling to [place] in December, he will be flying.   He had not had any problems with his breathing and he never had problems before  getting Covid with the altitude when flying.    Do you think he would need such a test?  Thank you in advance

## 2021-03-26 NOTE — Telephone Encounter (Signed)
He is not on supplemental oxygen, he has good exercise capacity and did not desat on aerobic exercise at pulmonary rehab.  His pulmonary function test shows only mild changes  He should be okay to travel on a plane.  Have a good trip home.

## 2021-04-30 ENCOUNTER — Encounter: Payer: Self-pay | Admitting: Internal Medicine

## 2021-05-07 ENCOUNTER — Ambulatory Visit (INDEPENDENT_AMBULATORY_CARE_PROVIDER_SITE_OTHER): Payer: 59

## 2021-05-07 ENCOUNTER — Encounter (HOSPITAL_COMMUNITY): Payer: Self-pay | Admitting: Emergency Medicine

## 2021-05-07 ENCOUNTER — Other Ambulatory Visit: Payer: Self-pay

## 2021-05-07 ENCOUNTER — Ambulatory Visit (HOSPITAL_COMMUNITY)
Admission: EM | Admit: 2021-05-07 | Discharge: 2021-05-07 | Disposition: A | Discharge status: Home / Self Care | Payer: 59 | Attending: Emergency Medicine | Admitting: Emergency Medicine

## 2021-05-07 DIAGNOSIS — R0602 Shortness of breath: Secondary | ICD-10-CM

## 2021-05-07 DIAGNOSIS — R051 Acute cough: Secondary | ICD-10-CM

## 2021-05-07 DIAGNOSIS — R059 Cough, unspecified: Secondary | ICD-10-CM

## 2021-05-07 MED ORDER — ALBUTEROL SULFATE HFA 108 (90 BASE) MCG/ACT IN AERS: 2.0000 | INHALATION_SPRAY | RESPIRATORY_TRACT | 0 refills | Status: DC | PRN

## 2021-05-07 MED ORDER — BENZONATATE 100 MG PO CAPS: 100.0000 mg | ORAL_CAPSULE | Freq: Three times a day (TID) | ORAL | 0 refills | Status: DC

## 2021-05-07 NOTE — Telephone Encounter (Signed)
PM please advise. Thanks  

## 2021-05-07 NOTE — Discharge Instructions (Signed)
Your chest x-ray did not show any new changes to your lungs or any signs of infection  You may use albuterol inhaler taking 2 puffs every 4-6 hours as needed for shortness of breath or wheezing  You may use Tessalon pill every 8 hours to help with cough  You may discontinue use of Mucinex as you do not have any congestion switch to Delsym which is a cough suppressant, you may take 60 mg twice a day as needed  Please follow-up with your pulmonologist for persistent or worsening symptoms for further evaluation

## 2021-05-07 NOTE — ED Provider Notes (Signed)
[facility]    CSN: [id number] Arrival date & time: 05/07/21  1241      History   Chief Complaint Chief Complaint  Patient presents with   Cough    HPI Dennis Howe is a 45 y.o. male.   Patient presents with productive cough for 3 days, endorses sore throat for 1 day which is now resolved, endorses chest soreness that began today with coughing.  Endorses wheezing or crackling heard predominantly at bedtime.  Has attempted use of Robitussin-DM which was minimally helpful.  No known sick contacts.  Tolerating food and liquids.  Denies fever, chills, body aches, headaches, ear pain or fullness, shortness of breath, abdominal pain, nausea, vomiting, diarrhea.  History of bilateral pneumothoraces.          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 Authorizing Provider  cholecalciferol (VITAMIN D3) 25 MCG (1000 UNIT) tablet Take 1,000 Units by mouth daily.    [provider]  Omega-3 Fatty Acids (FISH OIL) 1200 MG CAPS Take 4,200 mg by mouth daily.    [provider]  vitamin C (ASCORBIC ACID) 500 MG tablet Take 500 mg by mouth daily.    [provider]    Family History Family History  Problem Relation Age of Onset   Diabetes Mother    Hypertension Mother    Cancer  Father        lung   Asthma Daughter     Social History Social History   Tobacco Use   Smoking status: Former    Packs/day: 1.00    Years: 6.00    Pack years: 6.00    Types: Cigarettes    Quit date: 2017    Years since quitting: 5.8   Smokeless tobacco: Never   Tobacco comments:    smoked as a teenager  Substance Use Topics   Alcohol use: Never   Drug use: Never     Allergies   Patient has no known allergies.   Review of Systems Review of Systems  Constitutional: Negative.   HENT:  Positive for sore throat. Negative for congestion, dental problem, drooling, ear discharge, ear pain, facial swelling, hearing loss, mouth sores, nosebleeds, postnasal drip, rhinorrhea, sinus pressure, sinus pain, sneezing, tinnitus, trouble swallowing and voice change.   Respiratory:  Positive for cough. Negative for apnea, choking, chest tightness, shortness of breath, wheezing and stridor.   Cardiovascular: Negative.   Gastrointestinal: Negative.   Skin: Negative.   Neurological: Negative.     Physical Exam Triage Vital Signs ED Triage Vitals  Enc Vitals Group     BP 05/07/21 1326 (!) 151/96     Pulse Rate 05/07/21 1326 91     Resp 05/07/21 1326 17  Temp 05/07/21 1326 98.6 F (37 C)     Temp Source 05/07/21 1326 Oral     SpO2 05/07/21 1326 97 %     Weight --      Height --      Head Circumference --      Peak Flow --      Pain Score 05/07/21 1324 0     Pain Loc --      Pain Edu? --      Excl. in GC? --    No data found.  Updated Vital Signs BP (!) 151/96 (BP Location: Left Arm)   Pulse 91   Temp 98.6 F (37 C) (Oral)   Resp 17   SpO2 97%   Visual Acuity Right Eye Distance:   Left Eye Distance:   Bilateral Distance:    Right Eye Near:   Left Eye Near:    Bilateral Near:     Physical Exam Constitutional:      Appearance: Normal appearance. He is normal weight.  HENT:     Head: Normocephalic.  Eyes:     Extraocular Movements: Extraocular movements  intact.  Cardiovascular:     Rate and Rhythm: Normal rate and regular rhythm.     Pulses: Normal pulses.     Heart sounds: Normal heart sounds.  Pulmonary:     Effort: Pulmonary effort is normal.     Breath sounds: Normal breath sounds.  Skin:    General: Skin is warm and dry.  Neurological:     General: No focal deficit present.     Mental Status: He is alert and oriented to person, place, and time. Mental status is at baseline.  Psychiatric:        Mood and Affect: Mood normal.        Behavior: Behavior normal.     UC Treatments / Results  Labs (all labs ordered are listed, but only abnormal results are displayed) Labs Reviewed - No data to display  EKG   Radiology No results found.  Procedures Procedures (including critical care time)  Medications Ordered in UC Medications - No data to display  Initial Impression / Assessment and Plan / UC Course  I have reviewed the triage vital signs and the nursing notes.  Pertinent labs & imaging results that were available during my care of the patient were reviewed by me and considered in my medical decision making (see chart for details).  Acute cough  1.  Chest x-ray negative for acute changes, discussed findings with patient and wife. 2.  Albuterol inhaler 108 mcg 2 puffs every 4 hours as needed 3.  Tessalon 100 mg 3 times daily every 8 hours 4.  Recommended discontinuation of Mucinex as congestion is not a concern and advised switching to Delsym 60 mg twice daily as needed, patient to buy over-the-counter 5.  Recommended follow-up with pulmonologist for persistent or reoccurring symptoms  Final diagnoses:  None   Discharge Instructions   None    ED Prescriptions   None    PDMP not reviewed this encounter.   Valinda Hoar, NP 05/07/21 1450

## 2021-05-07 NOTE — ED Triage Notes (Signed)
Pt had productive cough since Friday. Reports woke up yesterday with sore throat. Chest pains with coughing.

## 2021-05-08 NOTE — Telephone Encounter (Signed)
Yes. He should get the pneumococcus pneumonia vaccine because of his lung issues. It is ok to get it with the flu shot

## 2021-08-16 ENCOUNTER — Encounter: Payer: Self-pay | Admitting: Internal Medicine

## 2021-08-16 DIAGNOSIS — R490 Dysphonia: Secondary | ICD-10-CM

## 2021-08-20 ENCOUNTER — Encounter: Payer: Self-pay | Admitting: Internal Medicine

## 2021-08-20 NOTE — Telephone Encounter (Signed)
I tried calling patient to apologize and had to speak with his wife.  I told her who I was and that I was calling to apologize to her husband.  I told wife that I have a hearing problem and that I was not trying to be rude at all this morning.  She told me that she did not care about my ____ problem and that I needed to call her husband and apologize to him.  She then told me not to mention my problem to him because it had nothing to do with her complaint and that I was trying to excuse my rude behavior.  She gave me her husbands number and I called and apologized to him but I did tell him that I have a hearing problem and that made the situation worse, and that I never meant to be rude to him.  Husband thanked me for calling and told me that we both had problems that caused the misunderstanding and thanked me again for getting in touch with him.

## 2021-08-21 NOTE — Telephone Encounter (Signed)
LVM on all phones for pt to schedule an OV with PCP.   If pt calls back, please schedule next available.

## 2021-09-07 ENCOUNTER — Other Ambulatory Visit: Payer: Self-pay

## 2021-09-07 ENCOUNTER — Encounter: Payer: Self-pay | Admitting: Internal Medicine

## 2021-09-07 ENCOUNTER — Ambulatory Visit (INDEPENDENT_AMBULATORY_CARE_PROVIDER_SITE_OTHER): Payer: No Typology Code available for payment source | Admitting: Internal Medicine

## 2021-09-07 VITALS — BP 124/66 | HR 76 | Resp 18 | Ht 71.0 in | Wt 219.6 lb

## 2021-09-07 DIAGNOSIS — R490 Dysphonia: Secondary | ICD-10-CM

## 2021-09-07 MED ORDER — PANTOPRAZOLE SODIUM 40 MG PO TBEC: 40.0000 mg | DELAYED_RELEASE_TABLET | Freq: Every day | ORAL | 3 refills | Status: DC

## 2021-09-07 NOTE — Patient Instructions (Signed)
We have sent in protonix to take 1 pill daily in the morning for the next 1-2 months. ? ?We will get you in with ENT. ?

## 2021-09-07 NOTE — Assessment & Plan Note (Signed)
Suspect some component now is GERD started protonix 40 mg daily. Also some concern for vocal cord injury as this started with trach. Refer to ENT for vocal cord evaluation. ?

## 2021-09-07 NOTE — Progress Notes (Signed)
? ?  Subjective:  ? ?Patient ID: Dennis Howe, male    [name]: 12/16/1975, 46 y.o.   [name]: [id number] ? ?HPI ?The patient is a 46 YO man coming in for intermittent hoarseness since trach and peg removal. Some GERD now which is worse but hoarseness intermittent over a year. ? ?Review of Systems  ?Constitutional: Negative.   ?HENT:  Positive for voice change.   ?Eyes: Negative.   ?Respiratory:  Negative for cough, chest tightness and shortness of breath.   ?Cardiovascular:  Negative for chest pain, palpitations and leg swelling.  ?Gastrointestinal:  Negative for abdominal distention, abdominal pain, constipation, diarrhea, nausea and vomiting.  ?     GERD  ?Musculoskeletal: Negative.   ?Skin: Negative.   ?Neurological: Negative.   ?Psychiatric/Behavioral: Negative.    ? ?Objective:  ?Physical Exam ?Constitutional:   ?   Appearance: He is well-developed.  ?HENT:  ?   Head: Normocephalic and atraumatic.  ?Cardiovascular:  ?   Rate and Rhythm: Normal rate and regular rhythm.  ?Pulmonary:  ?   Effort: Pulmonary effort is normal. No respiratory distress.  ?   Breath sounds: Normal breath sounds. No wheezing or rales.  ?   Comments: Rhonchi at the bases, improved lung exam from prior ?Abdominal:  ?   General: Bowel sounds are normal. There is no distension.  ?   Palpations: Abdomen is soft.  ?   Tenderness: There is no abdominal tenderness. There is no rebound.  ?Musculoskeletal:  ?   Cervical back: Normal range of motion.  ?Skin: ?   General: Skin is warm and dry.  ?Neurological:  ?   Mental Status: He is alert and oriented to person, place, and time.  ?   Coordination: Coordination normal.  ? ? ?Vitals:  ? 09/07/21 1515  ?BP: 124/66  ?Pulse: 76  ?Resp: 18  ?SpO2: 98%  ?Weight: 219 lb 9.6 oz (99.6 kg)  ?Height: 5\' 11"  (1.803 m)  ? ? ?This visit occurred during the SARS-CoV-2 public health emergency.  Safety protocols were in place, including screening questions prior to the visit, additional usage of staff PPE, and  extensive cleaning of exam room while observing appropriate contact time as indicated for disinfecting solutions.  ? ?Assessment & Plan:  ? ?

## 2021-11-14 ENCOUNTER — Encounter: Payer: 59 | Admitting: Emergency Medicine

## 2021-11-14 ENCOUNTER — Encounter: Payer: 59 | Admitting: Internal Medicine

## 2021-12-12 ENCOUNTER — Ambulatory Visit (INDEPENDENT_AMBULATORY_CARE_PROVIDER_SITE_OTHER): Payer: No Typology Code available for payment source | Admitting: Internal Medicine

## 2021-12-12 ENCOUNTER — Encounter: Payer: Self-pay | Admitting: Internal Medicine

## 2021-12-12 VITALS — BP 122/84 | HR 66 | Temp 98.7°F | Resp 18 | Ht 71.0 in | Wt 224.8 lb

## 2021-12-12 DIAGNOSIS — Z139 Encounter for screening, unspecified: Secondary | ICD-10-CM

## 2021-12-12 DIAGNOSIS — R7303 Prediabetes: Secondary | ICD-10-CM

## 2021-12-12 DIAGNOSIS — Z0000 Encounter for general adult medical examination without abnormal findings: Secondary | ICD-10-CM

## 2021-12-12 DIAGNOSIS — J984 Other disorders of lung: Secondary | ICD-10-CM

## 2021-12-12 LAB — CBC
HCT: 44.3 % (ref 39.0–52.0)
Hemoglobin: 14.8 g/dL (ref 13.0–17.0)
MCHC: 33.5 g/dL (ref 30.0–36.0)
MCV: 88.7 fl (ref 78.0–100.0)
Platelets: 225 10*3/uL (ref 150.0–400.0)
RBC: 5 Mil/uL (ref 4.22–5.81)
RDW: 13.3 % (ref 11.5–15.5)
WBC: 6 10*3/uL (ref 4.0–10.5)

## 2021-12-12 LAB — LIPID PANEL
Cholesterol: 191 mg/dL (ref 0–200)
HDL: 40.5 mg/dL (ref >39.00)
LDL Cholesterol: 116 mg/dL — ABNORMAL HIGH (ref 0–99)
NonHDL: 150.47
Total CHOL/HDL Ratio: 5
Triglycerides: 173 mg/dL — ABNORMAL HIGH (ref 0.0–149.0)
VLDL: 34.6 mg/dL (ref 0.0–40.0)

## 2021-12-12 LAB — COMPREHENSIVE METABOLIC PANEL
ALT: 35 U/L (ref 0–53)
AST: 21 U/L (ref 0–37)
Albumin: 4.5 g/dL (ref 3.5–5.2)
Alkaline Phosphatase: 57 U/L (ref 39–117)
BUN: 13 mg/dL (ref 6–23)
CO2: 28 mEq/L (ref 19–32)
Calcium: 9.1 mg/dL (ref 8.4–10.5)
Chloride: 104 mEq/L (ref 96–112)
Creatinine, Ser: 0.95 mg/dL (ref 0.40–1.50)
GFR: 97.68 mL/min (ref >60.00)
Glucose, Bld: 101 mg/dL — ABNORMAL HIGH (ref 70–99)
Potassium: 3.9 mEq/L (ref 3.5–5.1)
Sodium: 138 mEq/L (ref 135–145)
Total Bilirubin: 0.8 mg/dL (ref 0.2–1.2)
Total Protein: 7.5 g/dL (ref 6.0–8.3)

## 2021-12-12 LAB — HEMOGLOBIN A1C: Hgb A1c MFr Bld: 6.1 % (ref 4.6–6.5)

## 2021-12-12 NOTE — Assessment & Plan Note (Signed)
Checking HgA1c for monitoring.  

## 2021-12-12 NOTE — Assessment & Plan Note (Signed)
Overall doing well and working full time. He does have scarring from covid-19 and will monitor.

## 2021-12-12 NOTE — Progress Notes (Signed)
   Subjective:   Patient ID: Dennis Howe, male    [name]: 1975-08-28, 46 y.o.   [name]: [id number]  HPI The patient is here for physical.  PMH, [facility], social history reviewed and updated  Review of Systems  Constitutional: Negative.   HENT: Negative.    Eyes: Negative.   Respiratory:  Negative for cough, chest tightness and shortness of breath.   Cardiovascular:  Negative for chest pain, palpitations and leg swelling.  Gastrointestinal:  Negative for abdominal distention, abdominal pain, constipation, diarrhea, nausea and vomiting.  Musculoskeletal: Negative.   Skin: Negative.   Neurological: Negative.   Psychiatric/Behavioral: Negative.      Objective:  Physical Exam Constitutional:      Appearance: He is well-developed.  HENT:     Head: Normocephalic and atraumatic.  Cardiovascular:     Rate and Rhythm: Normal rate and regular rhythm.  Pulmonary:     Effort: Pulmonary effort is normal. No respiratory distress.     Breath sounds: Normal breath sounds. No wheezing or rales.  Abdominal:     General: Bowel sounds are normal. There is no distension.     Palpations: Abdomen is soft.     Tenderness: There is no abdominal tenderness. There is no rebound.  Musculoskeletal:     Cervical back: Normal range of motion.  Skin:    General: Skin is warm and dry.  Neurological:     Mental Status: He is alert and oriented to person, place, and time.     Coordination: Coordination normal.     Vitals:   12/12/21 0807  BP: 122/84  Pulse: 66  Resp: 18  Temp: 98.7 F (37.1 C)  TempSrc: Oral  SpO2: 97%  Weight: 224 lb 12.8 oz (102 kg)  Height: 5\' 11"  (1.803 m)    Assessment & Plan:

## 2021-12-12 NOTE — Assessment & Plan Note (Signed)
Flu shot yearly. Covid-19 counseled. Tetanus up to date. Counseled about sun safety and mole surveillance. Counseled about the dangers of distracted driving. Given 10 year screening recommendations.   

## 2021-12-15 IMAGING — DX DG CHEST 1V PORT
1 series · 1 of 1 positions shown · non-contrast
Comparison: Chest radiograph 03/23/2020

CLINICAL DATA: Patient from infusion center, went to get antibody
infusion this morning. Was unable to get infusion as O2 87% on RA.

EXAM:
PORTABLE CHEST 1 VIEW

[chest ap]
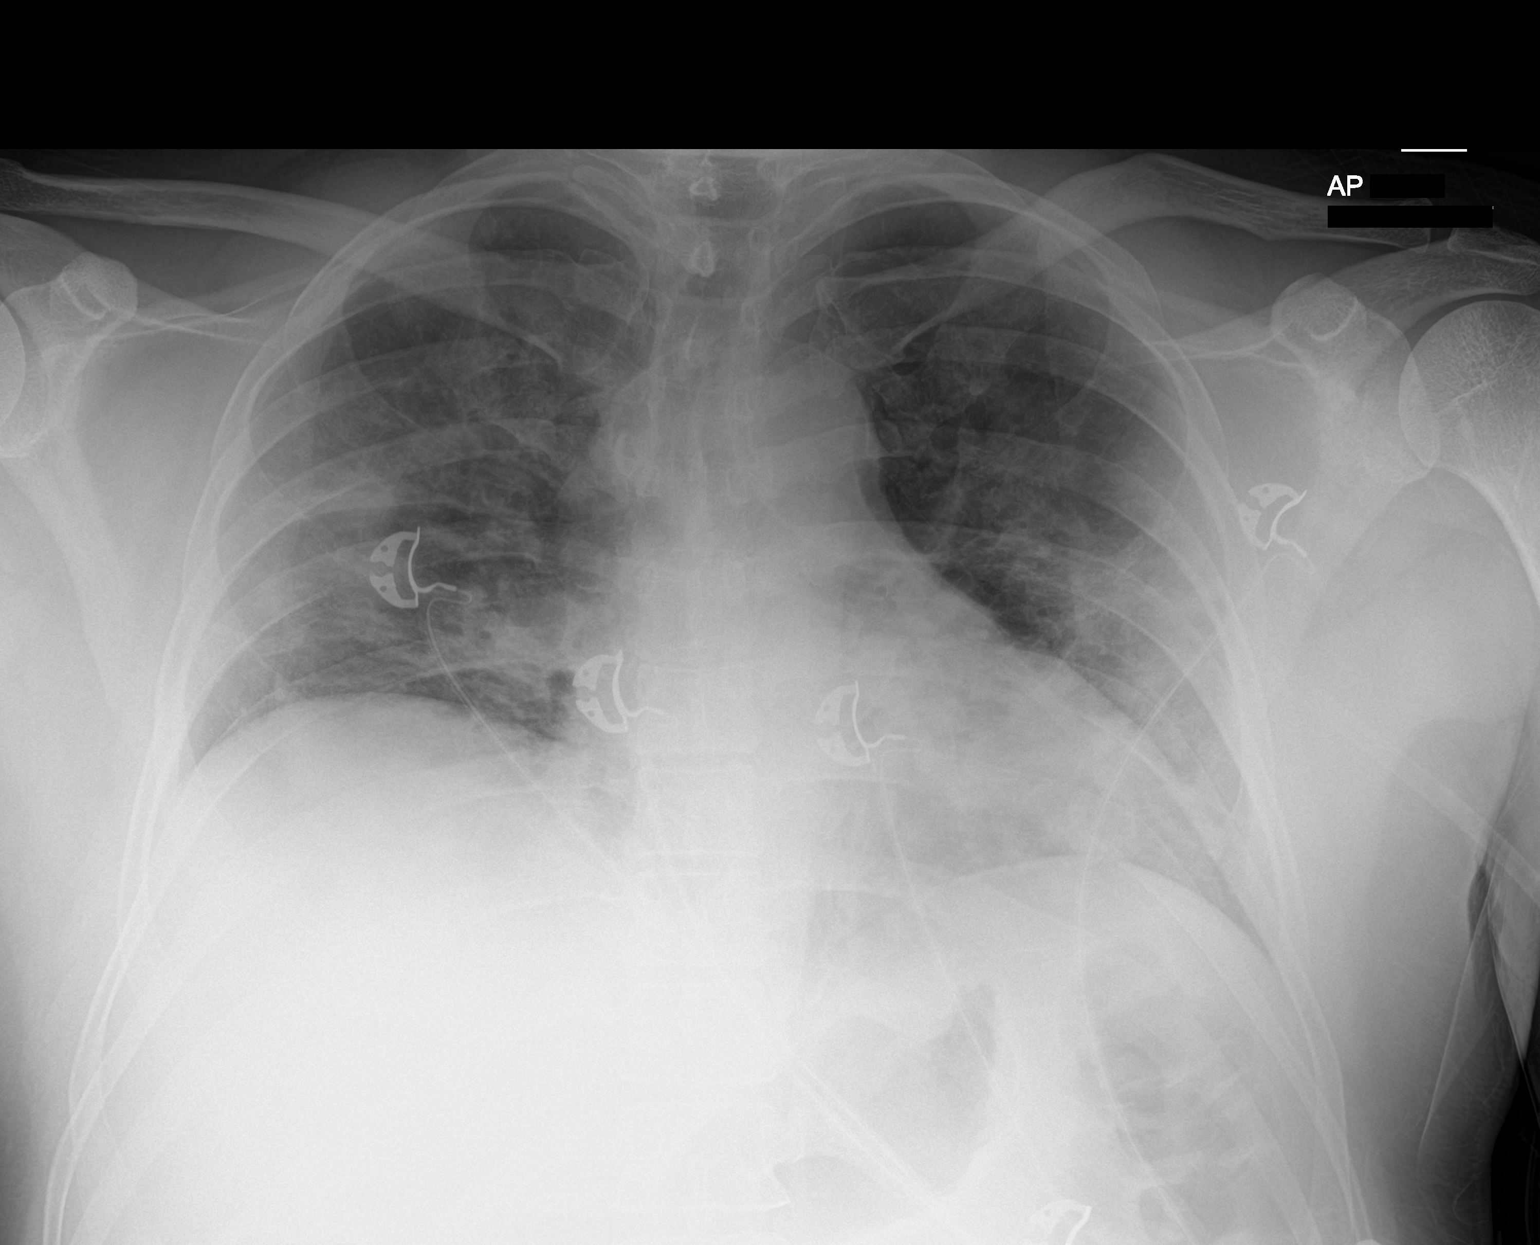

[1 of 1 positions shown; findings below may reference images not displayed]

FINDINGS: Stable cardiomediastinal contours. Low lung volumes. There are
persistent diffuse patchy predominantly peripheral opacities in the
left greater than right lungs, similar to prior, consistent with
multifocal pneumonia. No pneumothorax or large pleural effusion. No
acute finding in the visualized skeleton.
IMPRESSION: Diffuse bilateral opacities, predominantly peripheral, consistent
with multifocal infection.

## 2022-01-02 IMAGING — DX DG CHEST 1V PORT
1 series · 1 of 1 positions shown · non-contrast
Comparison: 04/11/2020

CLINICAL DATA: COVID pneumonia

EXAM:
PORTABLE CHEST 1 VIEW

[chest]
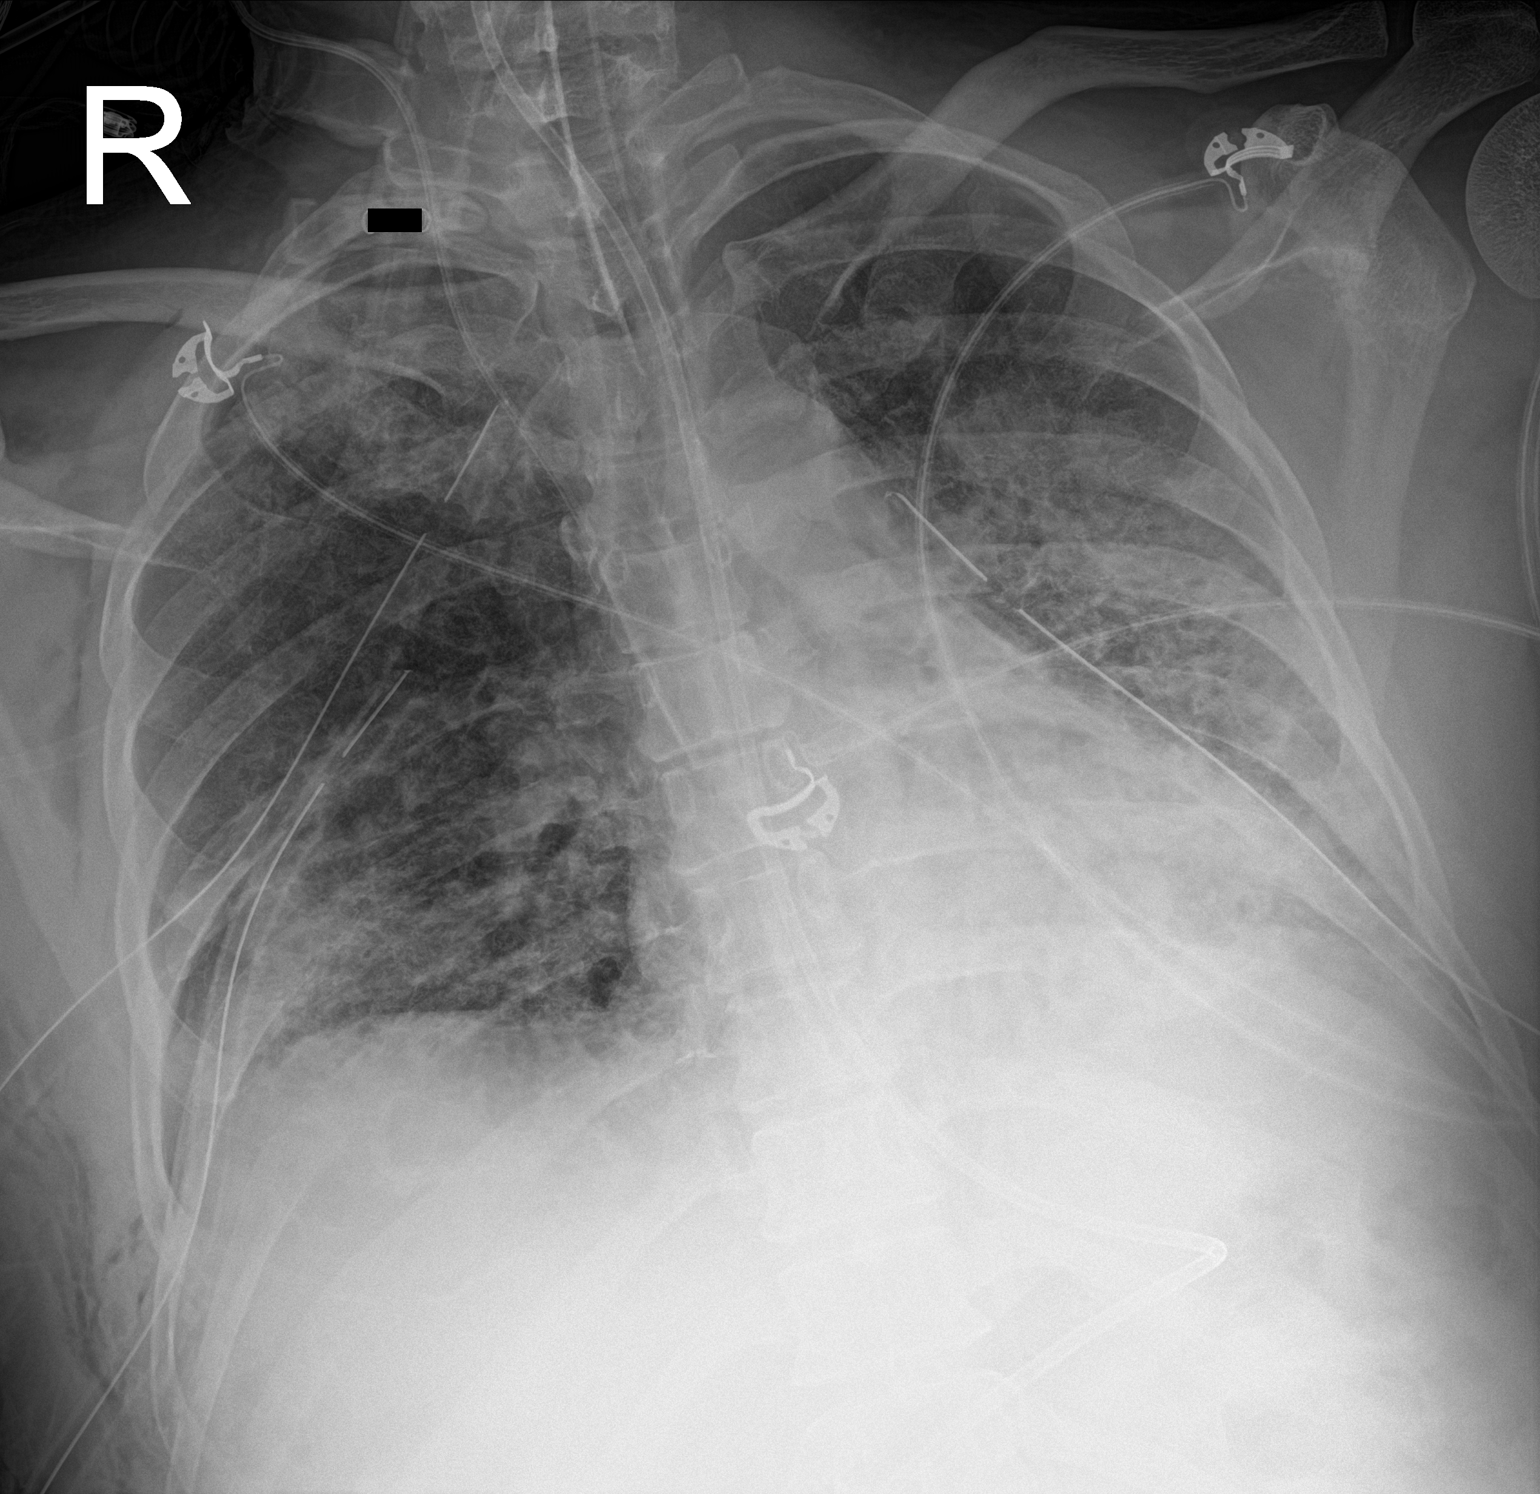

[1 of 1 positions shown; findings below may reference images not displayed]

FINDINGS: Endotracheal tube, enteric tube, right central venous catheter, and
2 right and 1 left chest tube are unchanged in position. Mild
cardiac enlargement. Bilateral pulmonary infiltrates. Subcutaneous
emphysema in the right chest wall. No change since prior study.
IMPRESSION: Bilateral pulmonary infiltrates unchanged. Stable support lines and
tubes.

## 2022-01-03 IMAGING — DX DG CHEST 1V PORT
1 series · 1 of 1 positions shown · non-contrast
Comparison: 04/12/2020

CLINICAL DATA: Chest tube in place

EXAM:
PORTABLE CHEST 1 VIEW

[chest]
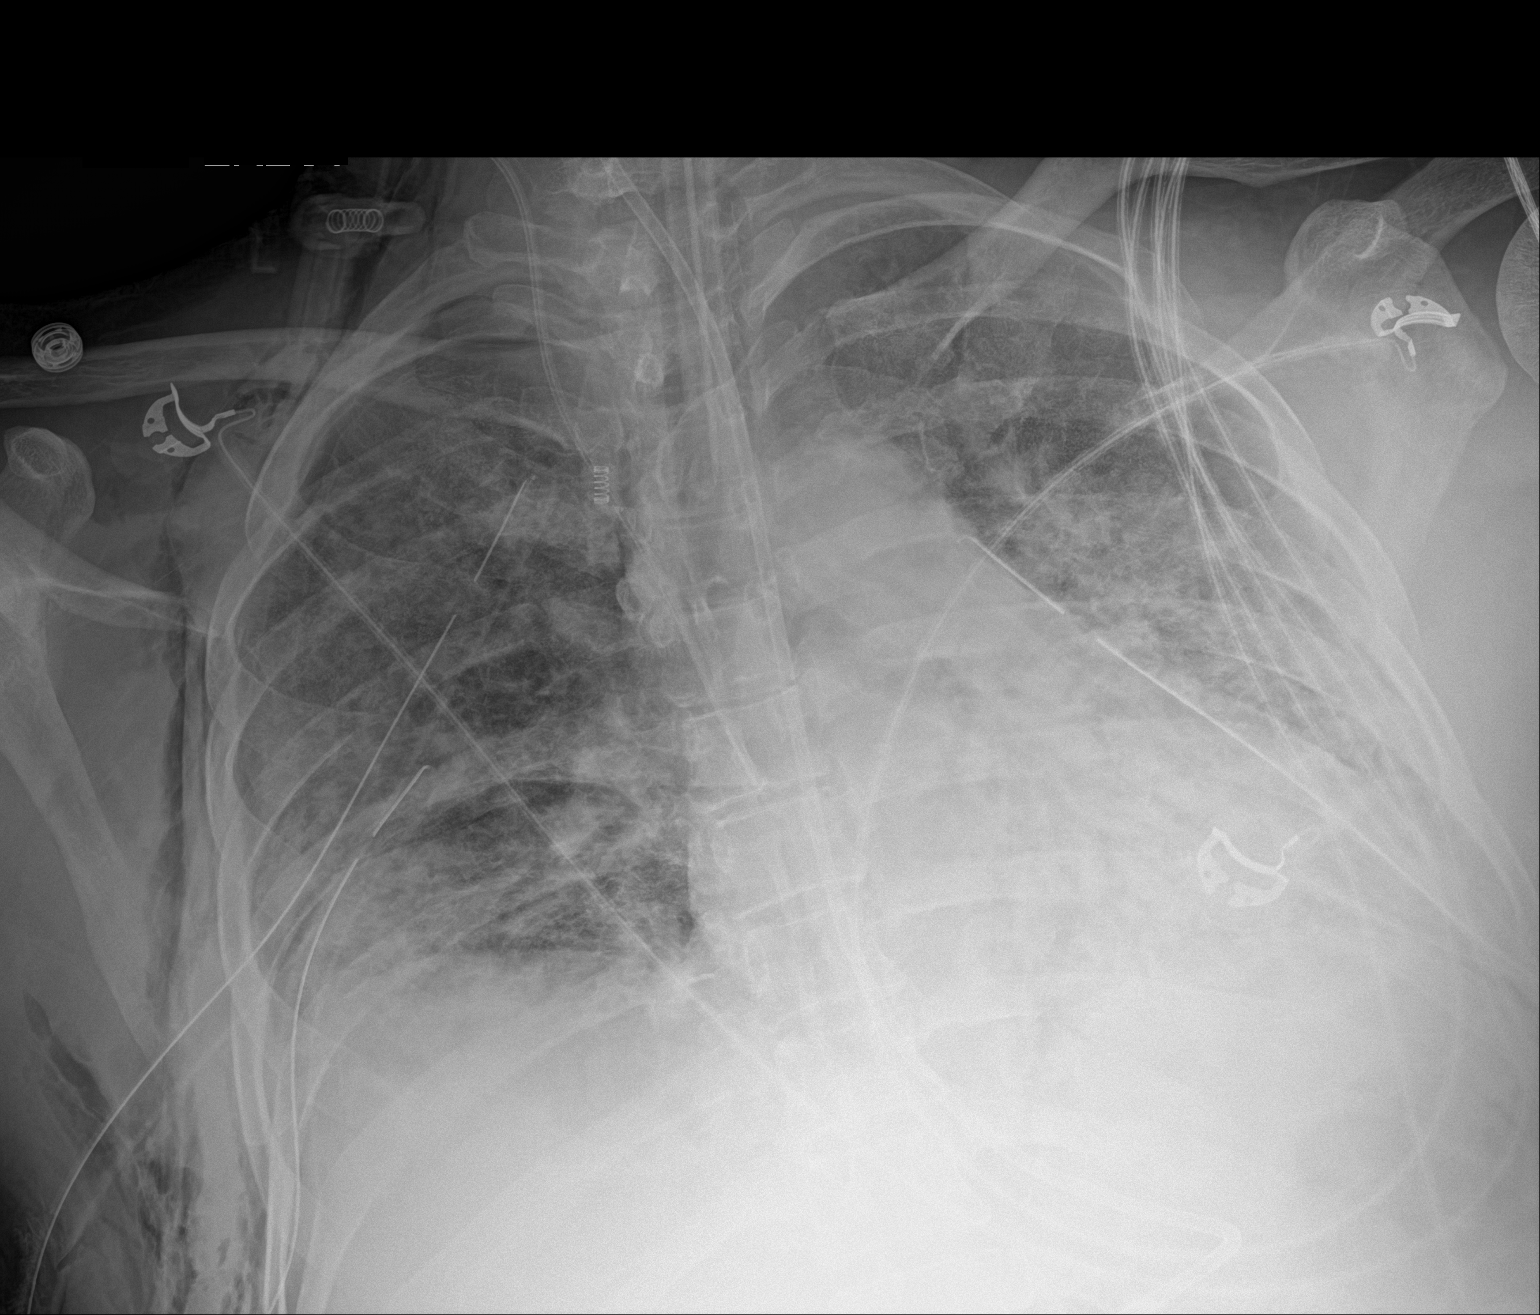

[1 of 1 positions shown; findings below may reference images not displayed]

FINDINGS: Endotracheal tube, enteric tube, right central venous catheter, 2
right chest tubes, and single left chest tube remain unchanged in
position. Small residual right apical pneumothorax without change.
Subcutaneous emphysema in the right chest wall and right neck may be
increasing. Cardiac enlargement. Diffuse bilateral pulmonary
infiltrates.
IMPRESSION: 1. Stable small residual right apical pneumothorax with chest tubes
in place.
2. Increasing subcutaneous emphysema.
3. Diffuse bilateral pulmonary infiltrates.

## 2022-01-10 ENCOUNTER — Other Ambulatory Visit: Payer: Self-pay | Admitting: Internal Medicine

## 2022-02-04 ENCOUNTER — Encounter: Payer: Self-pay | Admitting: Internal Medicine

## 2022-02-05 MED ORDER — PANTOPRAZOLE SODIUM 40 MG PO TBEC: 40.0000 mg | DELAYED_RELEASE_TABLET | Freq: Every day | ORAL | 3 refills | Status: DC

## 2022-06-18 ENCOUNTER — Other Ambulatory Visit: Payer: Self-pay | Admitting: Internal Medicine

## 2022-10-17 ENCOUNTER — Other Ambulatory Visit: Payer: Self-pay | Admitting: Internal Medicine

## 2022-12-31 ENCOUNTER — Other Ambulatory Visit: Payer: Self-pay | Admitting: Internal Medicine

## 2023-02-03 ENCOUNTER — Other Ambulatory Visit: Payer: Self-pay | Admitting: Internal Medicine

## 2023-03-04 ENCOUNTER — Encounter: Payer: No Typology Code available for payment source | Admitting: Internal Medicine

## 2023-03-24 ENCOUNTER — Encounter: Payer: Self-pay | Admitting: Internal Medicine

## 2023-03-24 ENCOUNTER — Ambulatory Visit (INDEPENDENT_AMBULATORY_CARE_PROVIDER_SITE_OTHER): Payer: PRIVATE HEALTH INSURANCE | Admitting: Internal Medicine

## 2023-03-24 VITALS — BP 120/80 | HR 65 | Temp 98.5°F | Ht 71.0 in | Wt 214.0 lb

## 2023-03-24 DIAGNOSIS — Z0000 Encounter for general adult medical examination without abnormal findings: Secondary | ICD-10-CM | POA: Diagnosis not present

## 2023-03-24 DIAGNOSIS — R7303 Prediabetes: Secondary | ICD-10-CM

## 2023-03-24 DIAGNOSIS — Z13220 Encounter for screening for lipoid disorders: Secondary | ICD-10-CM | POA: Diagnosis not present

## 2023-03-24 DIAGNOSIS — R490 Dysphonia: Secondary | ICD-10-CM | POA: Diagnosis not present

## 2023-03-24 DIAGNOSIS — Z23 Encounter for immunization: Secondary | ICD-10-CM

## 2023-03-24 DIAGNOSIS — J984 Other disorders of lung: Secondary | ICD-10-CM

## 2023-03-24 LAB — COMPREHENSIVE METABOLIC PANEL
ALT: 30 U/L (ref 0–53)
AST: 23 U/L (ref 0–37)
Albumin: 4.4 g/dL (ref 3.5–5.2)
Alkaline Phosphatase: 64 U/L (ref 39–117)
BUN: 14 mg/dL (ref 6–23)
CO2: 27 meq/L (ref 19–32)
Calcium: 9.3 mg/dL (ref 8.4–10.5)
Chloride: 103 meq/L (ref 96–112)
Creatinine, Ser: 0.99 mg/dL (ref 0.40–1.50)
GFR: 92.13 mL/min (ref >60.00)
Glucose, Bld: 93 mg/dL (ref 70–99)
Potassium: 3.6 meq/L (ref 3.5–5.1)
Sodium: 138 meq/L (ref 135–145)
Total Bilirubin: 0.6 mg/dL (ref 0.2–1.2)
Total Protein: 7.5 g/dL (ref 6.0–8.3)

## 2023-03-24 LAB — HEMOGLOBIN A1C: Hgb A1c MFr Bld: 5.9 % (ref 4.6–6.5)

## 2023-03-24 LAB — CBC
HCT: 43.9 % (ref 39.0–52.0)
Hemoglobin: 14.3 g/dL (ref 13.0–17.0)
MCHC: 32.6 g/dL (ref 30.0–36.0)
MCV: 90.5 fL (ref 78.0–100.0)
Platelets: 239 10*3/uL (ref 150.0–400.0)
RBC: 4.85 Mil/uL (ref 4.22–5.81)
RDW: 13.5 % (ref 11.5–15.5)
WBC: 6.7 10*3/uL (ref 4.0–10.5)

## 2023-03-24 LAB — LIPID PANEL
Cholesterol: 174 mg/dL (ref 0–200)
HDL: 44.2 mg/dL (ref >39.00)
LDL Cholesterol: 111 mg/dL — ABNORMAL HIGH (ref 0–99)
NonHDL: 129.37
Total CHOL/HDL Ratio: 4
Triglycerides: 91 mg/dL (ref 0.0–149.0)
VLDL: 18.2 mg/dL (ref 0.0–40.0)

## 2023-03-24 MED ORDER — PANTOPRAZOLE SODIUM 40 MG PO TBEC: 40.0000 mg | DELAYED_RELEASE_TABLET | Freq: Every day | ORAL | 3 refills | Status: DC

## 2023-03-24 NOTE — Assessment & Plan Note (Signed)
Has heartburn controlled with protonix. 1 year supply sent in.

## 2023-03-24 NOTE — Assessment & Plan Note (Signed)
Flu shot done. Tetanus up to date. Colonoscopy due counseled about options. Counseled about sun safety and mole surveillance. Counseled about the dangers of distracted driving. Given 10 year screening recommendations.

## 2023-03-24 NOTE — Patient Instructions (Addendum)
Think about the colon cancer screening either colonoscopy or cologuard.

## 2023-03-24 NOTE — Assessment & Plan Note (Signed)
Functional status normal now and amazing recovery. Not on any inhalers.

## 2023-03-24 NOTE — Progress Notes (Signed)
Subjective:   Patient ID: Dennis Howe, male    [name]: 04/25/1976, 47 y.o.   [name]: [id number]  HPI The patient is here for physical.  PMH, [facility], social history reviewed and updated  Review of Systems  Constitutional: Negative.   HENT: Negative.    Eyes: Negative.   Respiratory:  Negative for cough, chest tightness and shortness of breath.   Cardiovascular:  Negative for chest pain, palpitations and leg swelling.  Gastrointestinal:  Negative for abdominal distention, abdominal pain, constipation, diarrhea, nausea and vomiting.  Musculoskeletal: Negative.   Skin: Negative.   Neurological: Negative.   Psychiatric/Behavioral: Negative.      Objective:  Physical Exam Constitutional:      Appearance: He is well-developed.  HENT:     Head: Normocephalic and atraumatic.  Cardiovascular:     Rate and Rhythm: Normal rate and regular rhythm.  Pulmonary:     Effort: Pulmonary effort is normal. No respiratory distress.     Breath sounds: Normal breath sounds. No wheezing or rales.  Abdominal:     General: Bowel sounds are normal. There is no distension.     Palpations: Abdomen is soft.     Tenderness: There is no abdominal tenderness. There is no rebound.  Musculoskeletal:     Cervical back: Normal range of motion.  Skin:    General: Skin is warm and dry.  Neurological:     Mental Status: He is alert and oriented to person, place, and time.     Coordination: Coordination normal.     Vitals:   03/24/23 1317 03/24/23 1319 03/24/23 1340  BP: (!) 120/100 (!) 120/100 120/80  Pulse: 65    Temp: 98.5 F (36.9 C)    TempSrc: Oral    SpO2: 97%    Weight: 214 lb (97.1 kg)    Height: 5\' 11"  (1.803 m)      Assessment & Plan:  Flu shot given at visit

## 2023-03-24 NOTE — Assessment & Plan Note (Signed)
Checking HgA1c and adjust as needed.  

## 2024-03-21 ENCOUNTER — Other Ambulatory Visit: Payer: Self-pay | Admitting: Internal Medicine

## 2024-06-01 ENCOUNTER — Encounter: Admitting: Internal Medicine

## 2024-06-22 ENCOUNTER — Ambulatory Visit (INDEPENDENT_AMBULATORY_CARE_PROVIDER_SITE_OTHER): Admitting: Internal Medicine

## 2024-06-22 ENCOUNTER — Ambulatory Visit: Payer: Self-pay | Admitting: Internal Medicine

## 2024-06-22 ENCOUNTER — Encounter: Payer: Self-pay | Admitting: Internal Medicine

## 2024-06-22 VITALS — BP 110/80 | HR 60 | Temp 97.7°F | Ht 71.0 in | Wt 207.4 lb

## 2024-06-22 DIAGNOSIS — R7303 Prediabetes: Secondary | ICD-10-CM

## 2024-06-22 DIAGNOSIS — Z0000 Encounter for general adult medical examination without abnormal findings: Secondary | ICD-10-CM

## 2024-06-22 DIAGNOSIS — Z13220 Encounter for screening for lipoid disorders: Secondary | ICD-10-CM | POA: Diagnosis not present

## 2024-06-22 DIAGNOSIS — Z23 Encounter for immunization: Secondary | ICD-10-CM | POA: Diagnosis not present

## 2024-06-22 DIAGNOSIS — K219 Gastro-esophageal reflux disease without esophagitis: Secondary | ICD-10-CM | POA: Diagnosis not present

## 2024-06-22 DIAGNOSIS — Z1211 Encounter for screening for malignant neoplasm of colon: Secondary | ICD-10-CM

## 2024-06-22 LAB — COMPREHENSIVE METABOLIC PANEL WITH GFR
ALT: 28 U/L (ref 3–53)
AST: 14 U/L (ref 5–37)
Albumin: 4.3 g/dL (ref 3.5–5.2)
Alkaline Phosphatase: 61 U/L (ref 39–117)
BUN: 12 mg/dL (ref 6–23)
CO2: 30 meq/L (ref 19–32)
Calcium: 9.1 mg/dL (ref 8.4–10.5)
Chloride: 102 meq/L (ref 96–112)
Creatinine, Ser: 0.9 mg/dL (ref 0.40–1.50)
GFR: 102.39 mL/min
Glucose, Bld: 96 mg/dL (ref 70–99)
Potassium: 4 meq/L (ref 3.5–5.1)
Sodium: 139 meq/L (ref 135–145)
Total Bilirubin: 0.6 mg/dL (ref 0.2–1.2)
Total Protein: 7.3 g/dL (ref 6.0–8.3)

## 2024-06-22 LAB — CBC
HCT: 42.3 % (ref 39.0–52.0)
Hemoglobin: 14.5 g/dL (ref 13.0–17.0)
MCHC: 34.1 g/dL (ref 30.0–36.0)
MCV: 88.1 fl (ref 78.0–100.0)
Platelets: 305 K/uL (ref 150.0–400.0)
RBC: 4.81 Mil/uL (ref 4.22–5.81)
RDW: 12.3 % (ref 11.5–15.5)
WBC: 6.2 K/uL (ref 4.0–10.5)

## 2024-06-22 LAB — LIPID PANEL
Cholesterol: 169 mg/dL (ref 28–200)
HDL: 34.5 mg/dL — ABNORMAL LOW
LDL Cholesterol: 104 mg/dL — ABNORMAL HIGH (ref 10–99)
NonHDL: 134.07
Total CHOL/HDL Ratio: 5
Triglycerides: 150 mg/dL — ABNORMAL HIGH (ref 10.0–149.0)
VLDL: 30 mg/dL (ref 0.0–40.0)

## 2024-06-22 LAB — HEMOGLOBIN A1C: Hgb A1c MFr Bld: 6.1 % (ref 4.6–6.5)

## 2024-06-22 MED ORDER — PANTOPRAZOLE SODIUM 40 MG PO TBEC: 40.0000 mg | DELAYED_RELEASE_TABLET | Freq: Every day | ORAL | 3 refills | Status: AC

## 2024-06-22 NOTE — Progress Notes (Signed)
" ° °  Subjective:   Patient ID: Dennis Howe, male    [name]: 1975-12-25, 48 y.o.   [name]: [id number]  The patient is here for physical. Pertinent topics discussed: Discussed the use of AI scribe software for clinical note transcription with the patient, who gave verbal consent to proceed.  History of Present Illness Dennis Howe is a 48 year old male who presents for routine follow-up and colon cancer screening.  He was ill approximately two weeks ago with symptoms consistent with a common illness but has since recovered and is feeling much better.  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  PMH, [facility], social history reviewed and updated  Review of Systems  Constitutional: Negative.   HENT: Negative.    Eyes: Negative.   Respiratory:  Negative for cough, chest tightness and shortness of breath.   Cardiovascular:  Negative for chest pain, palpitations and leg swelling.  Gastrointestinal:  Negative for abdominal distention, abdominal pain, constipation, diarrhea, nausea and vomiting.  Musculoskeletal: Negative.   Skin: Negative.   Neurological: Negative.   Psychiatric/Behavioral: Negative.      Objective:  Physical Exam Constitutional:      Appearance: He is well-developed.  HENT:     Head: Normocephalic and atraumatic.  Cardiovascular:     Rate and Rhythm: Normal rate and regular rhythm.  Pulmonary:     Effort: Pulmonary effort is normal. No respiratory distress.     Breath sounds: Normal breath sounds. No wheezing or rales.  Abdominal:     General: Bowel sounds are normal. There is no distension.     Palpations: Abdomen is soft.     Tenderness: There is no abdominal tenderness.  Musculoskeletal:     Cervical back: Normal range of motion.  Skin:    General: Skin is warm and dry.  Neurological:     Mental Status: He is alert and oriented to person, place, and time.     Coordination: Coordination normal.      Vitals:   06/22/24 0759  BP: 110/80  Pulse: 60  Temp: 97.7 F (36.5 C)  TempSrc: Oral  SpO2: 97%  Weight: 207 lb 6.4 oz (94.1 kg)  Height: 5' 11 (1.803 m)    Assessment & Plan:   "

## 2024-06-22 NOTE — Assessment & Plan Note (Signed)
 Checking Hga1c and adjust as needed.

## 2024-06-22 NOTE — Assessment & Plan Note (Signed)
 Refilled protonix  which he uses prn only.

## 2024-06-22 NOTE — Assessment & Plan Note (Signed)
 Flu shot given. Tetanus up to date. Colonoscopy referral done. Counseled about sun safety and mole surveillance. Counseled about the dangers of distracted driving. Given 10 year screening recommendations.
